# Patient Record
Sex: Female | Born: 1990 | Race: White | Hispanic: No | Marital: Single | Smoking: Never smoker
Health system: Southern US, Community
[De-identification: ages and names within clinical notes are randomized; demographics above are authoritative.]

## PROBLEM LIST (undated history)

## (undated) DIAGNOSIS — L309 Dermatitis, unspecified: Secondary | ICD-10-CM

## (undated) DIAGNOSIS — F909 Attention-deficit hyperactivity disorder, unspecified type: Secondary | ICD-10-CM

## (undated) DIAGNOSIS — F419 Anxiety disorder, unspecified: Secondary | ICD-10-CM

## (undated) DIAGNOSIS — N938 Other specified abnormal uterine and vaginal bleeding: Secondary | ICD-10-CM

## (undated) DIAGNOSIS — F319 Bipolar disorder, unspecified: Secondary | ICD-10-CM

## (undated) DIAGNOSIS — F32A Depression, unspecified: Secondary | ICD-10-CM

## (undated) DIAGNOSIS — J189 Pneumonia, unspecified organism: Secondary | ICD-10-CM

## (undated) DIAGNOSIS — F329 Major depressive disorder, single episode, unspecified: Secondary | ICD-10-CM

## (undated) DIAGNOSIS — R0602 Shortness of breath: Secondary | ICD-10-CM

## (undated) DIAGNOSIS — B977 Papillomavirus as the cause of diseases classified elsewhere: Secondary | ICD-10-CM

## (undated) DIAGNOSIS — N809 Endometriosis, unspecified: Secondary | ICD-10-CM

## (undated) HISTORY — DX: Attention-deficit hyperactivity disorder, unspecified type: F90.9

## (undated) HISTORY — PX: TOE SURGERY: SHX1073

## (undated) HISTORY — DX: Papillomavirus as the cause of diseases classified elsewhere: B97.7

## (undated) HISTORY — DX: Other specified abnormal uterine and vaginal bleeding: N93.8

## (undated) HISTORY — DX: Anxiety disorder, unspecified: F41.9

## (undated) HISTORY — PX: OOPHORECTOMY: SHX86

## (undated) HISTORY — DX: Endometriosis, unspecified: N80.9

## (undated) HISTORY — DX: Major depressive disorder, single episode, unspecified: F32.9

## (undated) HISTORY — DX: Shortness of breath: R06.02

## (undated) HISTORY — DX: Depression, unspecified: F32.A

## (undated) HISTORY — DX: Dermatitis, unspecified: L30.9

## (undated) HISTORY — DX: Bipolar disorder, unspecified: F31.9

---
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
## 2008-02-10 ENCOUNTER — Telehealth (INDEPENDENT_AMBULATORY_CARE_PROVIDER_SITE_OTHER): Payer: Self-pay | Admitting: *Deleted

## 2008-02-10 ENCOUNTER — Ambulatory Visit: Payer: Self-pay | Admitting: Family Medicine

## 2008-02-10 DIAGNOSIS — F329 Major depressive disorder, single episode, unspecified: Secondary | ICD-10-CM | POA: Insufficient documentation

## 2008-02-10 DIAGNOSIS — B078 Other viral warts: Secondary | ICD-10-CM | POA: Insufficient documentation

## 2008-02-13 ENCOUNTER — Ambulatory Visit: Payer: Self-pay | Admitting: Licensed Clinical Social Worker

## 2008-02-20 ENCOUNTER — Ambulatory Visit: Payer: Self-pay | Admitting: Licensed Clinical Social Worker

## 2008-02-20 ENCOUNTER — Telehealth: Payer: Self-pay | Admitting: Family Medicine

## 2008-02-27 ENCOUNTER — Ambulatory Visit: Payer: Self-pay | Admitting: Licensed Clinical Social Worker

## 2008-03-03 ENCOUNTER — Encounter: Payer: Self-pay | Admitting: Family Medicine

## 2008-03-03 ENCOUNTER — Ambulatory Visit: Payer: Self-pay | Admitting: Family Medicine

## 2008-03-03 ENCOUNTER — Other Ambulatory Visit: Admission: RE | Admit: 2008-03-03 | Discharge: 2008-03-03 | Payer: Self-pay | Admitting: Family Medicine

## 2008-03-03 DIAGNOSIS — F988 Other specified behavioral and emotional disorders with onset usually occurring in childhood and adolescence: Secondary | ICD-10-CM | POA: Insufficient documentation

## 2008-03-03 DIAGNOSIS — N946 Dysmenorrhea, unspecified: Secondary | ICD-10-CM | POA: Insufficient documentation

## 2008-03-03 LAB — CONVERTED CEMR LAB: Beta hcg, urine, semiquantitative: NEGATIVE

## 2008-03-06 ENCOUNTER — Ambulatory Visit: Payer: Self-pay | Admitting: Licensed Clinical Social Worker

## 2008-03-11 ENCOUNTER — Telehealth: Payer: Self-pay | Admitting: Family Medicine

## 2008-03-13 ENCOUNTER — Ambulatory Visit: Payer: Self-pay | Admitting: Licensed Clinical Social Worker

## 2008-03-17 ENCOUNTER — Ambulatory Visit: Payer: Self-pay | Admitting: Family Medicine

## 2008-03-17 ENCOUNTER — Encounter: Payer: Self-pay | Admitting: *Deleted

## 2008-03-18 ENCOUNTER — Ambulatory Visit: Payer: Self-pay | Admitting: Licensed Clinical Social Worker

## 2008-03-26 ENCOUNTER — Ambulatory Visit: Payer: Self-pay | Admitting: Licensed Clinical Social Worker

## 2008-03-27 ENCOUNTER — Ambulatory Visit: Payer: Self-pay | Admitting: Licensed Clinical Social Worker

## 2008-03-31 ENCOUNTER — Telehealth: Payer: Self-pay | Admitting: *Deleted

## 2008-05-04 ENCOUNTER — Telehealth: Payer: Self-pay | Admitting: *Deleted

## 2008-05-05 ENCOUNTER — Telehealth: Payer: Self-pay | Admitting: Family Medicine

## 2008-05-07 ENCOUNTER — Telehealth: Payer: Self-pay | Admitting: Family Medicine

## 2008-05-25 ENCOUNTER — Telehealth: Payer: Self-pay | Admitting: Family Medicine

## 2008-05-25 ENCOUNTER — Ambulatory Visit: Payer: Self-pay | Admitting: Family Medicine

## 2008-06-06 ENCOUNTER — Ambulatory Visit: Payer: Self-pay | Admitting: Internal Medicine

## 2008-06-06 ENCOUNTER — Inpatient Hospital Stay (HOSPITAL_COMMUNITY): Admission: EM | Admit: 2008-06-06 | Discharge: 2008-06-08 | Payer: Self-pay | Admitting: Emergency Medicine

## 2008-06-08 ENCOUNTER — Ambulatory Visit: Payer: Self-pay | Admitting: *Deleted

## 2008-06-08 ENCOUNTER — Inpatient Hospital Stay (HOSPITAL_COMMUNITY): Admission: RE | Admit: 2008-06-08 | Discharge: 2008-06-11 | Payer: Self-pay | Admitting: *Deleted

## 2008-08-21 ENCOUNTER — Inpatient Hospital Stay (HOSPITAL_COMMUNITY): Admission: RE | Admit: 2008-08-21 | Discharge: 2008-08-25 | Payer: Self-pay | Admitting: Psychiatry

## 2008-08-21 ENCOUNTER — Ambulatory Visit: Payer: Self-pay | Admitting: Psychiatry

## 2008-11-13 ENCOUNTER — Ambulatory Visit: Payer: Self-pay | Admitting: Family Medicine

## 2008-11-13 DIAGNOSIS — B079 Viral wart, unspecified: Secondary | ICD-10-CM | POA: Insufficient documentation

## 2009-03-14 ENCOUNTER — Inpatient Hospital Stay (HOSPITAL_COMMUNITY): Admission: AD | Admit: 2009-03-14 | Discharge: 2009-03-14 | Payer: Self-pay | Admitting: Obstetrics and Gynecology

## 2009-03-14 ENCOUNTER — Ambulatory Visit: Payer: Self-pay | Admitting: Family

## 2009-07-01 ENCOUNTER — Ambulatory Visit (HOSPITAL_BASED_OUTPATIENT_CLINIC_OR_DEPARTMENT_OTHER): Admission: RE | Admit: 2009-07-01 | Discharge: 2009-07-01 | Payer: Self-pay | Admitting: Orthopedic Surgery

## 2009-07-22 ENCOUNTER — Ambulatory Visit: Payer: Self-pay | Admitting: Family Medicine

## 2009-07-22 DIAGNOSIS — I872 Venous insufficiency (chronic) (peripheral): Secondary | ICD-10-CM | POA: Insufficient documentation

## 2009-07-29 ENCOUNTER — Ambulatory Visit (HOSPITAL_BASED_OUTPATIENT_CLINIC_OR_DEPARTMENT_OTHER): Admission: RE | Admit: 2009-07-29 | Discharge: 2009-07-29 | Payer: Self-pay | Admitting: Orthopedic Surgery

## 2010-06-21 ENCOUNTER — Emergency Department (HOSPITAL_COMMUNITY): Admission: EM | Admit: 2010-06-21 | Discharge: 2010-06-21 | Payer: Self-pay | Admitting: Emergency Medicine

## 2010-10-31 LAB — POCT HEMOGLOBIN-HEMACUE: Hemoglobin: 15 g/dL (ref 12.0–15.0)

## 2010-11-01 LAB — POCT HEMOGLOBIN-HEMACUE: Hemoglobin: 14.1 g/dL (ref 12.0–15.0)

## 2010-11-06 LAB — URINALYSIS, ROUTINE W REFLEX MICROSCOPIC
Bilirubin Urine: NEGATIVE
Glucose, UA: NEGATIVE mg/dL
Specific Gravity, Urine: 1.015 (ref 1.005–1.030)
Urobilinogen, UA: 0.2 mg/dL (ref 0.0–1.0)
pH: 8 (ref 5.0–8.0)

## 2010-11-06 LAB — POCT PREGNANCY, URINE: Preg Test, Ur: NEGATIVE

## 2010-11-06 LAB — GC/CHLAMYDIA PROBE AMP, GENITAL
Chlamydia, DNA Probe: NEGATIVE
GC Probe Amp, Genital: NEGATIVE

## 2010-11-06 LAB — WET PREP, GENITAL: Yeast Wet Prep HPF POC: NONE SEEN

## 2010-11-14 LAB — COMPREHENSIVE METABOLIC PANEL
ALT: 15 U/L (ref 0–35)
Alkaline Phosphatase: 42 U/L (ref 39–117)
CO2: 25 mEq/L (ref 19–32)
Calcium: 9.2 mg/dL (ref 8.4–10.5)
Chloride: 108 mEq/L (ref 96–112)
Creatinine, Ser: 0.74 mg/dL (ref 0.4–1.2)
Glucose, Bld: 106 mg/dL — ABNORMAL HIGH (ref 70–99)
Total Protein: 6.4 g/dL (ref 6.0–8.3)

## 2010-11-14 LAB — DRUGS OF ABUSE SCREEN W/O ALC, ROUTINE URINE
Amphetamine Screen, Ur: NEGATIVE
Benzodiazepines.: NEGATIVE
Cocaine Metabolites: NEGATIVE
Creatinine,U: 369.6 mg/dL
Marijuana Metabolite: NEGATIVE
Methadone: NEGATIVE
Phencyclidine (PCP): NEGATIVE

## 2010-11-14 LAB — CBC
HCT: 39 % (ref 36.0–46.0)
Hemoglobin: 13.6 g/dL (ref 12.0–15.0)
MCHC: 34.8 g/dL (ref 30.0–36.0)
MCV: 88.7 fL (ref 78.0–100.0)
Platelets: 320 10*3/uL (ref 150–400)
RBC: 4.4 MIL/uL (ref 3.87–5.11)
RDW: 12.9 % (ref 11.5–15.5)
WBC: 8.4 10*3/uL (ref 4.0–10.5)

## 2010-11-14 LAB — PREGNANCY, URINE: Preg Test, Ur: NEGATIVE

## 2010-11-14 LAB — URINALYSIS, ROUTINE W REFLEX MICROSCOPIC
Glucose, UA: NEGATIVE mg/dL
Protein, ur: NEGATIVE mg/dL
pH: 6 (ref 5.0–8.0)

## 2010-11-14 LAB — TSH: TSH: 0.908 u[IU]/mL (ref 0.350–4.500)

## 2010-12-13 NOTE — H&P (Signed)
[name], [name] NO.:  [id number]   MEDICAL RECORD NO.:  [id number]          PATIENT TYPE:  IPS   LOCATION:  0502                          FACILITY:  [facility]   PHYSICIAN:  [name], M.D.      DATE OF BIRTH:  [birth year]   DATE OF ADMISSION:  08/21/2008  DATE OF DISCHARGE:                       PSYCHIATRIC ADMISSION ASSESSMENT   This is a voluntary admission to the services of Dr. Geoffery Russo.   IDENTIFYING INFORMATION:  This is a 20 year old single white female.  She was brought to the hospital here yesterday by her probation officer  and her grandparents, as she had made a suicidal gesture.  The patient  tried to cut her wrists.  Her grandparents intervened.  She had  superficially cut her right hand.  Apparently, the patient and her  grandparents were arguing again, just not getting along.  The patient  acknowledged that this particular conflict was over a relationship with  a boyfriend, whom she was seeing the last time she was here at [facility].  Patient is not to have any contact with him, by  court order.  Today, the patient denies seeing him or having any contact  with him.  However, her grandparents, who are her legal guardians,  disagree about this.   PAST PSYCHIATRIC HISTORY:  She was last with [place] from November 9th until  November 12th.   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   FAMILY HISTORY:  Her biological father is an alcoholic.   ALCOHOL/DRUG HISTORY:  Patient does have a history for polysubstance  abuse but not recently.   PRIMARY CARE [name]:  Dr. Tawanna Russo.  After she was last with [place], she saw  Kaylee Lulas, NP, and [name].  She does have upcoming  appointments with both.   MEDICAL PROBLEMS:  None.   MEDICATIONS:  1. BuSpar 10 mg t.i.d.  2. Zoloft 100 mg daily.  3. Trazodone 100.   She states that these meds are quite helpful, although she could use a  little increase in the trazodone, as she finds she awakens at 3 or 4 in  the morning and has difficulty returning to sleep.   She has no known drug allergies.   PHYSICAL EXAMINATION:  She is a well-developed and well-nourished 65-  year-old who appears her stated age.  She does have an ankle bracelet  intact on her left ankle.  VITAL SIGNS:  She is 67 inches tall.  Weighs 141.  Temperature 98.3,  blood pressure 94/63, pulse 68, respirations 18.  She does have superficial self-inflicted cuts on her right thenar area  of her thumb.  The remainder of her physical exam is unremarkable.   Her lab work is not yet ready.   MENTAL STATUS EXAM:  Today, she is alert and oriented.  She is  appropriately groomed, dressed, and nourished.  Her speech is in normal  rate, rhythm, and tone.  Her mood is for the most part appropriate to  the situation.  She gets irritated and frustrated  easily.  Judgment and  insight are fair.  Concentration and memory are intact.  Intelligence is  at least average.  She denies being suicidal or homicidal.  She states  that she did that yesterday because she is frustrated.  She denies  having auditory or visual hallucinations.   DIAGNOSES:   AXIS I:  Mood disorder, not otherwise specified.   AXIS II:  Deferred.   AXIS III:  None at present.   AXIS IV:  Legal and primary support.   AXIS V:  35.   PLAN:  Admit for safety and stabilization to help coordinate a change of  residence.  She is hoping to go live with her biological father's  father, so this would be her paternal grandfather.   ESTIMATED LENGTH OF STAY:  3-4 days.      [name], P.A.-C.      [name], M.D.  Electronically Signed    MD/MEDQ  D:  08/22/2008  T:  08/22/2008  Job:  [id number]

## 2010-12-13 NOTE — Consult Note (Signed)
[name], [name] NO.:  [id number]   MEDICAL RECORD NO.:  [id number]          PATIENT TYPE:  INP   LOCATION:  1509                         FACILITY:  [facility]   PHYSICIAN:  Anselm Jungling, MD  DATE OF BIRTH:  02/07/1991   DATE OF CONSULTATION:  06/07/2008  DATE OF DISCHARGE:                                 CONSULTATION   IDENTIFYING DATA AND REASON FOR REFERRAL:  The patient is a 20 year old  single Caucasian female referred for psychiatric consultation in the  aftermath of a suicide attempt by overdose.  Sources of information  include the patient's chart, the patient herself and her parents, who  were interviewed along with the patient, with the patient's permission.   HISTORY OF THE PRESENTING PROBLEMS:  The patient has a history of  depression that appears to be of about one year's duration.  In January  of 2009 she took an overdose of Ritalin.  She did not get any further  treatment, however, until the end of August of this year, when she  consulted with Dr. Tawanna Russo at [facility].  He prescribe Celexa, the  antidepressant, and also, she was referred to an individual therapist,  [name], whom she has now seen twice.  She has also been  prescribed Adderall, ostensibly for ADHD, and Klonopin.   Two days ago the patient took an overdose of her medication.  Her  parents, with whom she lives, were aware of this.  They did not seek  medical attention.  The mother monitored her closely.  However, the  following day, that is yesterday, the patient took another overdose, a  couple of handfuls of Celexa.  She was admitted to [facility],  and today is medically stable.   The patient and her parents live in [place], [place].  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   She indicates that this was a suicide attempt and she did attend to die.  She indicates that she feels worthless lately, I can't get anything  done and I never feel happy.   The patient's mother explains that she has been involved with a young  man named [name] during the past several months.  He is currently  referred to as her ex-boyfriend, but mother indicates that [name] has  had a great deal of difficulty breaking away from this young man, who  was been described as sociopathic and narcissistic.  She has gotten into  legal trouble because of being associated with him during some of his  illegal activities.  In fact, there is a court order forbidding her to  have any further contact with him and because she has broken that order  she has been on house arrest for the past several weeks.  This young man  is felt to be a very poor influence, and in fact he recently on one  occasion told her in a text message that she should suicide.   MEDICAL HISTORY:  Please refer to H and P.   FAMILY AND SOCIAL HISTORY:  As  above.   MENTAL STATUS AND OBSERVATIONS:  The patient is a well-nourished,  normally-developed, late adolescent female who is interviewed in her  hospital room.  She has been on one-to-one observation.  Her parents are  present.  They appear to be quite supportive.  The patient indicates  that she is comfortable with her parents being present while I interview  her.  Her thoughts and speech are normally organized.  There is nothing  to suggest any underlying psychosis, thought disorder or delirium.  She  appears to have a reasonable degree of insight, and her judgment about  her current situation appears to be intact, her recent poor decision-  making not withstanding.  She indicates that she is glad she survived  the overdose, even though she did intend to die at the time that she  took it.   IMPRESSION:  AXIS I:  Major depressive disorder, recurrent.  AXIS II:  Deferred.  AXIS III:  Status post overdose.  AXIS IV:  Stressors severe.  AXIS V:  GAF 45.   RECOMMENDATIONS:  I discussed with the patient and her  parents the  possibility of coming to inpatient psychiatry for further crisis  stabilization and more definitive treatment of her depression.  The  patient and her parents are both agreeable to this.  We feel that it is  necessary, because the patient has obviously been involved in a  reasonable plan of outpatient treatment for her depression, without  success, and in fact has made two separate overdoses within the past 48  hours.  She appears to be a somewhat immature individual who may be  under the sway of a highly controlling individual, who the parents fear  she may still be in contact with.  As such, her safety cannot be assured  outside of the inpatient setting at this point in time.   I will contact [facility] assessment and initiate the transfer process,  assuming the patient is medically cleared for discharge from [facility] today.      Anselm Jungling, MD  Electronically Signed     SPB/MEDQ  D:  06/07/2008  T:  06/07/2008  Job:  [phone]

## 2010-12-13 NOTE — H&P (Signed)
NAMEMARGRIT, MINNER NO.:  [id number]   MEDICAL RECORD NO.:  [id number]          PATIENT TYPE:  IPS   LOCATION:  0306                          [facility]ACILITY:  [facility]   PHYSICIAN:  Jasmine Pang, M.D. DATE OF BIRTH:  09-13-1990   DATE OF ADMISSION:  06/08/2008  DATE OF DISCHARGE:                       PSYCHIATRIC ADMISSION ASSESSMENT   A 20 year old female voluntarily admitted on June 08, 2008.   HISTORY OF PRESENT ILLNESS:  Patient is a transfer from [facility] after the patient had a short stay after overdosing on Klonopin  and then Celexa.  She reports that she initially overdosed on her  Klonopin on Friday and then on Saturday overdosed on her Celexa.  Her  parents had called 911 at the time.  The patient was transferred to the  emergency department and then admitted.  The patient reports that she  has been having conflict between her ex-boyfriend and her parents.  She  states her parents do not like her ex-boyfriend.  She apparently got  into some legal problems with her ex-boyfriend with a breaking and  entering charge. Patient today denies any suicidal thoughts.  Reports  having supportive parents.  Denies wanting any further connection with  her ex-boyfriend.  She has been sleeping satisfactory and has been  eating satisfactory.  Denies any alcohol or drug use.   PAST PSYCHIATRIC HISTORY:  First admission to [facility].  Has an appointment with Ocie Bob, who she has not seen yet.  Also has a therapist named Roanna Raider.   SOCIAL HISTORY:  This is a 20 year old single female.  She lives in  [place].  She lives with her parents.  The patient is currently under  house arrest for breaking and entering, is a Consulting civil engineer at [employer]   FAMILY HISTORY:  Patient is adopted.   ALCOHOL/DRUG HISTORY:  The patient states that her last use of  recreational drugs was in May 2009.   PRIMARY CARE Kaitlan Bin:  Eugenio Hoes. Tawanna Cooler, MD at  [facility].   MEDICAL PROBLEMS:  Denies any acute or chronic health problems.   MEDICATIONS:  1. Has been prescribed Celexa.  The patient was prescribed 20, but      taking 40 mg at bedtime to help her sleep.  2. Klonopin which she states was helpful for sleep purposes.  3. Adderall for ADHD.   DRUG ALLERGIES:  NO KNOWN ALLERGIES.   PHYSICAL EXAMINATION:  GENERAL:  This is a healthy-appearing young  female.  She was fully assessed at [facility] during her hospital stay  and again she appears in no acute distress.  Offers no complaints,  appears well-developed and well-nourished.  VITAL SIGNS:  Temperature of 97.3, 64 heart rate, 18 respirations, blood  pressure is 96/58, 5 feet 7 inches tall, 142 pounds.   LABORATORY DATA:  Pregnancy test is negative.  CBC within normal limits.  Urine drug screen positive for amphetamines, positive for  benzodiazepines.  Acetaminophen level less than 10.  CMET  within normal  limits.  Alcohol level less than 5.  Salicylate less than 4.  MENTAL STATUS EXAM:  This is a young female, cooperative, casually  dressed.  She is fully alert.  Speech is clear, normal pace and tone.  The patient's mood is neutral.  The patient's affect is depressed and  minimal anxiety level.  Thought process are coherent and goal directed.  Denies any suicidal thoughts.  No delusional statements.  Cognitive  function intact.  Her memory appears to be good.  Judgment and insight  is fair.   AXIS I:  Mood disorder.  AXIS II:  Deferred.  AXIS III:  No known medical conditions.  AXIS IV:  Problems with primary support group and legal system.  AXIS V:  Current is 45.   PLAN:  Have her contract for safety.  We will stabilize mood and  thinking.  We will hold her Adderall and Klonopin at this time and have  Librium available for withdrawal symptoms on a p.r.n. basis.  We  discussed her Celexa.  The patient does not feel this was beneficial.  We will initiate Zoloft, but the  risks and benefits discussed.  We will  also have Seroquel available at bedtime for sleep and have 25 mg  available on a p.r.n. basis for anxiety.  We will also have a family  session with her parents for support, concerns and increase her coping  skills.  Tentative length of stay at this time is 3-4 days.      Landry Corporal, N.P.      Jasmine Pang, M.D.  Electronically Signed    JO/MEDQ  D:  06/09/2008  T:  06/09/2008  Job:  [id number]

## 2010-12-13 NOTE — Discharge Summary (Signed)
NAMEANALIA, [name] NO.:  [id number]   MEDICAL RECORD NO.:  [id number]          PATIENT TYPE:  IPS   LOCATION:  0306                          FACILITY:  [facility]   PHYSICIAN:  Jasmine Pang, M.D. DATE OF BIRTH:  1990/12/11   DATE OF ADMISSION:  06/08/2008  DATE OF DISCHARGE:  06/11/2008                               DISCHARGE SUMMARY   IDENTIFICATION:  This is a 20 year old single white female who was  admitted on a voluntary basis on June 08, 2008.   HISTORY OF PRESENT ILLNESS:  The patient has a transfer from [facility] after she had a short stay for an overdose.  She had overdosed  on Klonopin and then Celexa.  She reports that she was initially  overdosed on Klonopin on Friday and then on Saturday overdosed on her  Celexa.  Her parents called 9-1-1 at the time.  The patient was  transferred to the emergency department and then admitted.  The patient  reports that she has been having conflict between her ex-boyfriend and  her parents.  She states her parents do not like her ex-boyfriend.  She  apparently got into some legal problems with her ex-boyfriend with a  breaking and entering charge.  The patient today denies any suicidal  thoughts.  She reports having supportive parents.  Denies wanting any  further connection with her ex-boyfriend.  The patient has been sleeping  satisfactorily and has been eating satisfactorily.  She denies any  alcohol or drug use.   PAST PSYCHIATRIC HISTORY:  This is the first admission to [facility].  She has an appointment with Ellis Savage for medication  management at [facility].  She has not seen her yet.  She also has a therapist, named Silver Huguenin.   FAMILY HISTORY:  None known.   ALCOHOL AND DRUG HISTORY:  The patient states that her last use of  recreational drugs was in May 2009.   MEDICAL PROBLEMS:  Denies any chronic or acute health problems.   MEDICATIONS:  The  patient has been prescribed Celexa.  She was  prescribed 20 mg and was taking 40 mg at bedtime to help me sleep.  She has been on Klonopin, which she states was very helpful for sleep  purposes.  She has also been on Adderall for ADHD.   DRUG ALLERGIES:  No known drug allergies.   PHYSICAL FINDINGS:  There were no acute physical or medical problems  noted.   LABORATORY DATA:  Pregnancy test was negative.  CBC was within normal  limits.  Urine drug screen positive for amphetamines, positive for  benzodiazepines.  Acetaminophen level less than 10.  C-MET within normal  limits.  Alcohol level less than 5.  Salicylate less than 4.   HOSPITAL COURSE:  Upon admission, the patient was started on Ambien 5 mg  p.o. q.h.s. p.r.n. insomnia may repeat x1 if needed and Librium 25 mg  q.6 h. p.r.n. symptoms of withdrawal x48 hours.  She was also started on  Seroquel 50 mg at h.s. and Zoloft 25  mg daily and Seroquel 25 mg q.6 h.  p.r.n. anxiety.  She was too sedate on the Seroquel and did not want to  use this anymore.  This was discontinued as was the Librium and she was  started back on Klonopin 0.5 mg p.o. q.h.s.  In individual sessions with  me, the patient was friendly and cooperative.  She also participated  appropriately in unit therapeutic groups and activities.  She denies  current use of drugs or alcohol.  She states she stopped in May 2009.  She states she used to abuse Xanax and alcohol.  She discussed her  boyfriend.  He got her into trouble with a breaking and entering.  She  is now on house arrest.  The parents do not like the boyfriend and she  had gotten into an argument with them.  She was adopted by her  grandparents, but still has a close relationship with her biologic  mother.  It is the grandparents; however, that she considers her mother  and father.  On June 10, 2008, the patient was less depressed and  less anxious.  Grandparents are visiting and very supportive.  Sleep  was  good.  Appetite was good.  On June 11, 2008, mental status had  improved markedly from admission status.  The patient had a family  session with her parents today.  This went well and they were very  supportive of her.  Mood was less depressed, less anxious.  Affect  consistent with mood.  There was no suicidal or homicidal ideation.  No  thoughts of self-injurious behavior.  No auditory or visual  hallucinations.  No paranoia or delusions.  Thoughts were logical and  goal-directed.  Thought content no predominant theme.  Cognitive was  grossly intact.  Insight good.  Judgment good.  Impulse control was  good.  She was having no side effects from medications.  She was wanting  discharge today and her family felt comfortable with her coming home  after the family session.   DISCHARGE DIAGNOSES:  Axis I:  Mood disorder, not otherwise specified,  history of polysubstance abuse, none since May 2009.  Axis II:  None.  Axis III:  No known medical problems.  Axis IV:  Severe (problems with primary support group in the legal  system, burden of psychiatric illness).  Axis V: Global assessment of functioning was 55 upon discharge.  GAF was  45 upon admission.  GAF was 65 highest past year.   DISCHARGE PLANS:  There was no specific activity level or dietary  restrictions.   POSTHOSPITAL CARE PLANS:  The patient will go to [facility] to see Ellis Savage on 06/30/2008 at 1:50 p.m.  She will also  see her therapist, Silver Huguenin on November 17th at 4:30 p.m.   DISCHARGE MEDICATIONS:  1. Zoloft was increased to 50 mg daily.  She was also discharged on      clonazepam 0.5 mg p.o. q.h.s.  Ambien 5 mg at bedtime if needed for      sleep.  She was to restart her Adderall as prescribed by her      primary care physician.  She was to stop the Celexa.      Jasmine Pang, M.D.  Electronically Signed     BHS/MEDQ  D:  06/11/2008  T:  06/12/2008  Job:  [id number]

## 2010-12-13 NOTE — Discharge Summary (Signed)
Kaylee, Russo                ACCOUNT NO.:  [id number]   MEDICAL RECORD NO.:  [id number]          PATIENT TYPE:  INP   LOCATION:  1509                         FACILITY:  [facility]   PHYSICIAN:  Raenette Rover. Felicity Coyer, MDDATE OF BIRTH:  [birth year]   DATE OF ADMISSION:  06/06/2008  DATE OF DISCHARGE:  06/08/2008                               DISCHARGE SUMMARY   PRIMARY CARE PHYSICIAN:  Tinnie Gens A. Tawanna Cooler, MD.   DISCHARGE DIAGNOSES:  1. Suicide attempt with drug overdose with Celexa.  Note, this is the      patient's third suicide attempt.  2. Major depressive disorder.   HISTORY OF PRESENT ILLNESS:  Ms. [name] is a 20 year old white female  with past medical history of anxiety, depression and ADHD who presented  to [facility] on day of admission with reports of drug  overdose.  The patient reported that on day prior to admission she did  take Klonopin. along with Celexa however, the patient was unsure of how  many pills she did take.  The patient did admit to suicide attempt by  taking medications.  Of note, the patient's depression appears to be  approximately 1 year in duration.  Per psychiatry in January of 2009 the  patient took an overdose or Ritalin however, did not receive any further  treatment until August of this year at which time primary care physician  prescribed Celexa, as well as outpatient psychiatric treatment.  At time  of admission the patient had seen outpatient therapy twice.  Please see  complete psychiatric consultation for further details.   PAST MEDICAL HISTORY:  1. Depression, anxiety.  2. ADHD.   COURSE OF HOSPITALIZATION:  Suicide attempt with Celexa overdose and  major depressive disorder.  The patient was admitted to the hospital to  monitor for prolonged QT syndrome, as well as any arrhythmias.  Telemetry times 48 hours was negative for any arrhythmias or EKG  abnormalities.  Again, psychiatry was asked to see the patient in  consultation  who has recommended at this time intensive inpatient  psychiatric treatment and more definitive treatment of the patient's  depression as she has failed outpatient treatment.  The patient denies  any suicidal ideation at this time however, there is certainly a concern  for patient's safety given multiple suicide attempts in the past with  poor control of the patient's depression.  At this time the patient is  felt medically stable for transfer to behavioral health center as lab  work within normal limits.   MEDICATIONS AT TIME OF DISCHARGE:  1. None.  2. The patient has been maintained with p.r.n. Ativan during      hospitalization.  3. Her Adderall Celexa and Klonopin were all discontinued at time of      discharge.  4. Further medication regimen to be determined at behavioral health      center.   PERTINENT LAB WORK AT TIME OF DISCHARGE:  Sodium 139, potassium 4.2, BUN  12, creatinine 0.74.  LFTs within normal limits.  Total albumin 3.5.  Hemoglobin 13.5.  EtOH negative.  Salicylate  and acetaminophen negative.  Urine drug screen at time of admission positive for amphetamines and  benzodiazepines.   PHYSICAL EXAM AT TIME OF DISCHARGE:  Blood pressure 96/58, heart rate  51, respirations 18, temperature 98.2, O2 SAT is 98% on room air.  IN GENERAL:  This is a young white female awake and alert in no acute  distress.  HEAD:  Normocephalic, atraumatic.  EYES:  Pupils are equal, round, reactive to light without scleral  icterus or injection.  Extraocular movements are intact.  EAR, NOSE AND THROAT:  Mucous membranes are moist with no lesions.  NECK:  Supple.  No thyromegaly or lymphadenopathy.  CHEST:  Symmetrical movement, nontender to palpation.  CARDIOVASCULAR:  S1 and S2, regular rate and rhythm.  No murmurs, rubs  or gallops.  No lower extremity edema.  RESPIRATORY:  Lung sounds are clear to auscultation bilaterally with no  increased work of breathing.  GI:  Abdomen is soft,  nontender, nondistended with positive bowel  sounds.  NEUROLOGICALLY:  Patient moves all extremities x4 without any motor or  sensory deficits on exam.  PSYCHOLOGICALLY:  The patient is alert and oriented times three with a  slightly flat affect.   DISPOSITION:  Again, the patient felt medically stable for transfer to  behavioral health center at this time for more intensive inpatient  psychiatric treatment.  At time of dictation awaiting bed availability.      Cordelia Pen, NP      Raenette Rover. Felicity Coyer, MD  Electronically Signed    LE/MEDQ  D:  06/08/2008  T:  06/08/2008  Job:  [id number]   cc:   Tinnie Gens A. Tawanna Cooler, MD  [address] [place]  [place] [address]

## 2010-12-13 NOTE — H&P (Signed)
[name], [name]                ACCOUNT NO.:  [id number]   MEDICAL RECORD NO.:  [id number]          PATIENT TYPE:  INP   LOCATION:  1509                         FACILITY:  [facility]   PHYSICIAN:  Kela Millin, M.D.DATE OF BIRTH:  06-19-1991   DATE OF ADMISSION:  06/06/2008  DATE OF DISCHARGE:                              HISTORY & PHYSICAL   PRIMARY CARE PHYSICIAN:  Dr. Kelle Darting with [facility].   CHIEF COMPLAINT:  Drug overdose.   HISTORY OF PRESENT ILLNESS:  The patient is a 20 year old white female  with past medical history significant for anxiety/depression, ADD, who  presents with the above complaints.  She states that on the day prior to  presentation she took some Klonopin along with Celexa and she states she  does not remember how many of those she took, but when she woke up this  morning she was still alive and so she took a handful of Celexa sometime  between 3:00 and 4:00 p.m.  She admits that she was suicidal at the time  she took these medications, but that she is no longer suicidal at this  time.  Her adopted mother, who is actually her grandmother, states that  she has been on house arrest for breaking and entry, and one of the  conditions given to her was that she was not to contact any of the other  codefendants.  And it happens that one of the code defendants is her  boyfriend, and so she has been secretly contacting him, and when she is  confronted her about it today she became very agitated and stated that  she was going to take her pills, and she hid them from her, but she  ended up going through her (Grandmother's) medications and she had  Celexa from about 3 years ago and she took handfuls of the Celexa.  It  is unclear exactly how many tablets she took.  She denies chest pain.   DICTATION ENDED HERE.      Kela Millin, M.D.  Electronically Signed     ACV/MEDQ  D:  06/06/2008  T:  06/07/2008  Job:  [id number]

## 2010-12-13 NOTE — H&P (Signed)
[name], [name]                ACCOUNT NO.:  [id number]   MEDICAL RECORD NO.:  [id number]          PATIENT TYPE:  INP   LOCATION:  1509                         FACILITY:  [facility]   PHYSICIAN:  Kela Millin, M.D.DATE OF BIRTH:  1991/01/13   DATE OF ADMISSION:  06/06/2008  DATE OF DISCHARGE:                              HISTORY & PHYSICAL   CONTNUATION:   PRIMARY CARE PHYSICIAN:  Dr. Kelle Russo.   HISTORY OF PRESENT ILLNESS:  She also denies cough, fevers, dysuria,  melena and hematochezia.  Poison Control was called per the ED and they  recommended giving the patient activated charcoal without sorbitol and  also the recommendation was for the patient to be monitored on telemetry  for QT prolongation.  She had a drug screen done in the ER which was  positive for benzodiazepines and amphetamines (she was on Adderall as  well as Klonopin).  Her acetaminophen level was less than 10, alcohol  level less than 5, and salicylate level less than 4. She is admitted for  further evaluation and management.  Her grandmother also states that  after the patient took the handful of Celexa she also proceeded to take  a knife and was threatening to kill herself, and at that point she  called 9-1-1.   PAST MEDICAL HISTORY:  As above.   MEDICATIONS:  1. Adderall 15 mg q.a.m.  2. Celexa 20 mg p.o. q.h.s.  3. Klonopin 0.5 mg p.r.n.   ALLERGIES:  NKDA.   SOCIAL HISTORY:  Positive for tobacco.  She denies alcohol.   REVIEW OF SYSTEMS:  As per HPI, other review of systems negative.   PHYSICAL EXAM:  GENERAL:  The patient is a young, white female.  She is  alert and oriented x3, in no apparent distress.  VITAL SIGNS: Temperature is 98.4 with a blood pressure of 97/60, pulse  of 84, respiratory rate of 20, O2 sat of 97%.  HEENT: PERRL, EOMI. Sclerae are anicteric.  Mucous membranes appear  black (from the charcoal).  NECK: Supple, no adenopathy, no thyromegaly and no JVD.  LUNGS: Clear to  auscultation bilaterally.  No crackles and no wheezes.  CARDIOVASCULAR: Regular rate and rhythm.  Normal S1-S2.  ABDOMEN:  Soft, bowel sounds present, nontender, nondistended.  No  organomegaly and no masses palpable.  EXTREMITIES:  No cyanosis and no edema.  NEURO: She is alert and oriented x3.  Cranial nerves II-XII grossly  intact.  Nonfocal exam.   LABORATORY DATA:  Her white cell count is 8.8, hemoglobin 13.5,  hematocrit of 40.1, platelet count of 365, neutrophil count of 59%. The  urine drug screen is positive for benzodiazepines and amphetamines,  otherwise negative.  The other drug levels as per HPI.  Sodium is 140,  potassium 3.9, chloride 108, CO2 of 27, glucose of 70, BUN 10,  creatinine 0.92, calcium of 9.1, total protein 6.7, albumin of 4.1, AST  is 16, ALT 12.   ASSESSMENT AND PLAN:  1. Suicide attempt/drug overdose - as discussed above, will admit for      close monitoring, one-on-one suicide precautions.  Also monitor on      tele and follow and repeat EKG to monitor for QT      prolongation/arrhythmias.  I have consulted psychiatry for further      recommendations.  2. History of anxiety/depression.  3. History of attention-deficit disorder- as above.  Psychiatric      consultation for further recommendations.      Kela Millin, M.D.  Electronically Signed     ACV/MEDQ  D:  06/06/2008  T:  06/07/2008  Job:  [id number]   cc:   Kaylee Darting, MD

## 2010-12-16 NOTE — Discharge Summary (Signed)
NAMEMADISSON, [name] NO.:  [id number]   MEDICAL RECORD NO.:  [id number]          PATIENT TYPE:  IPS   LOCATION:  0502                          [facility]  [facility]   PHYSICIAN:  Geoffery Lyons, M.D.      DATE OF BIRTH:  [birth year]   DATE OF ADMISSION:  08/21/2008  DATE OF DISCHARGE:  08/25/2008                               DISCHARGE SUMMARY   CHIEF COMPLAINT AND PRESENT ILLNESS:  This was the second admission to  [facility] for this 20 year old single white female  brought to the hospital by her probation officer and her grandparents.  She had made a suicide gesture.  She tried to cut her wrist.  Apparently, she was arguing with her grandparents again just not  getting along.  This particular conflict was over the relationship with  a boyfriend whom she was seeing the last time she was here at [facility].  She is not supposed to have any contact with him by  court order __________ said that they were going to violate her  probation and __________.   PAST PSYCHIATRIC HISTORY:  Last admission November 9th to November 12th.   SUBSTANCE ABUSE HISTORY:  History of polysubstance abuse, clean, no  relapse.   MEDICAL HISTORY:  Noncontributory.   MEDICATIONS:  1. BuSpar 10 mg 3 times a day.  2. Zoloft 100 mg per day.  3. Trazodone 100 mg at bedtime.   PHYSICAL EXAMINATION:  Compatible with the already described  lacerations.   LABORATORY WORKUP:  White blood cell 8.4, hemoglobin 13.6, sodium 140,  potassium 3.7, glucose 106, SGOT 18, SGPT 15, total bilirubin 0.5, and  TSH 0.908.  Drug screening negative for substances of abuse.   Mental status exam reveals alert and cooperative female.  Appropriately  groomed, dressed, and nourished.  Speech is normal, rate, rhythm, and  tone.  Mood is anxious, worries, gets irritated and frustrated easily.  No active delusion.  No hallucinations.  Thought process is logical,  coherent, and  relevant.  Endorsed no active suicidal or homicidal ideas.  Cognition well preserved.   ADMISSION DIAGNOSES:  AXIS I: Mood disorder, not otherwise specified.  Polysubstance abuse, in remission.  AXIS II: No diagnosis.  AXIS III: No diagnosis.  AXIS IV: Moderate.  AXIS V: Global assessment of functioning upon admission 35, highest  global assessment of functioning in the last year 60.   COURSE IN HOSPITAL:  She was readmitted.  Started in individual and  group psychotherapy.  We went ahead and started Lamictal as she endorsed  mood changes with episodes of racing thoughts, increased energy,  decreased sleep, poor impulse control, and irritable, then falls into  depression.  She is on house arrest since September.  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  This is going well for her.  There was a family session  on January 26th.  The parents spoke that many times she will become  angry and be impulsive.  She recognized that and was willing to take  medication as well as work herself, which  is already going well.  On  January 26th, she was in full contact of reality.  There were no active  suicidal or homicidal ideas.  No hallucinations or delusions.  She was  oriented and motivated to pursue further outpatient treatment.  Pretty  committed not to getting anymore trouble, was going to decide not to  contact the boyfriend anymore, as she herself realized that the  relationship was not a good relationship to begin with and the  relationship was a factor in her abusing substance before.   DISCHARGE DIAGNOSES:  AXIS I: Mood disorder, not otherwise specified.  Anxiety disorder, not otherwise specified.  Polysubstance abuse, in  remission.  AXIS II: No diagnosis.  AXIS III: No diagnosis.  AXIS IV: Moderate.  AXIS V: Global assessment of functioning upon discharge 50 to 55.   Discharged on:  1. BuSpar 10 mg 3 times a day.  2. Zoloft 100 mg per day.  3. Trazodone 150 mg per day.  4. Lamictal 25  mg x12 more days then increase to 50.   Follow up Misty Stanley __________ in [facility].      Geoffery Lyons, M.D.  Electronically Signed     IL/MEDQ  D:  09/04/2008  T:  09/05/2008  Job:  [id number]

## 2011-05-02 LAB — COMPREHENSIVE METABOLIC PANEL
ALT: 12
AST: 16
Albumin: 4.1
Calcium: 9.1
GFR calc non Af Amer: >60
Potassium: 3.9
Total Bilirubin: 0.8
Total Protein: 6.7

## 2011-05-02 LAB — BASIC METABOLIC PANEL
BUN: 12
Calcium: 8.4
GFR calc Af Amer: >60
GFR calc Af Amer: >60
GFR calc non Af Amer: >60
GFR calc non Af Amer: >60
Glucose, Bld: 104 — ABNORMAL HIGH
Potassium: 3.7
Potassium: 4.2
Sodium: 140

## 2011-05-02 LAB — ACETAMINOPHEN LEVEL: Acetaminophen (Tylenol), Serum: <10 — ABNORMAL LOW

## 2011-05-02 LAB — HEPATIC FUNCTION PANEL
ALT: 10
Albumin: 3.5
Indirect Bilirubin: 0.3
Total Protein: 5.6 — ABNORMAL LOW

## 2011-05-02 LAB — RAPID URINE DRUG SCREEN, HOSP PERFORMED
Amphetamines: POSITIVE — AB
Benzodiazepines: POSITIVE — AB
Tetrahydrocannabinol: NOT DETECTED

## 2011-05-02 LAB — CBC
HCT: 40.1
MCHC: 33.6
Platelets: 365
RBC: 4.5
RDW: 13.6

## 2011-05-02 LAB — DIFFERENTIAL
Neutro Abs: 5.2
Neutrophils Relative %: 59

## 2011-05-02 LAB — TSH: TSH: 2.016

## 2011-07-03 ENCOUNTER — Ambulatory Visit (INDEPENDENT_AMBULATORY_CARE_PROVIDER_SITE_OTHER): Payer: BC Managed Care – PPO | Admitting: Family Medicine

## 2011-07-03 ENCOUNTER — Telehealth: Payer: Self-pay | Admitting: Family Medicine

## 2011-07-03 ENCOUNTER — Encounter: Payer: Self-pay | Admitting: Family Medicine

## 2011-07-03 VITALS — BP 110/74 | Temp 98.3°F | Wt 196.0 lb

## 2011-07-03 DIAGNOSIS — Z23 Encounter for immunization: Secondary | ICD-10-CM

## 2011-07-03 DIAGNOSIS — J069 Acute upper respiratory infection, unspecified: Secondary | ICD-10-CM

## 2011-07-03 MED ORDER — HYDROCODONE-HOMATROPINE 5-1.5 MG/5ML PO SYRP: ORAL_SOLUTION | ORAL | Status: DC

## 2011-07-03 NOTE — Patient Instructions (Signed)
Drink lots of water.  Hydromet one half to 1 teaspoon nightly p.r.n. For cough.  Motrin 600 mg twice daily with food.  Return p.r.n.

## 2011-07-03 NOTE — Telephone Encounter (Signed)
Okay to work in

## 2011-07-03 NOTE — Telephone Encounter (Signed)
Pt has cough,congestion, body aches, sinus pressure and headache. Pt req work in [employer] or a med called in to [facility] on [address].

## 2011-07-03 NOTE — Progress Notes (Signed)
  Subjective:    Patient ID: [name], female    DOB: [birth year], 20 y.o.   MRN: [id number]  HPI  [name] Is a 20 year old single female, nonsmoker, who comes in today for evaluation of head congestion, sore throat, and cough for one week.  Review of systems negative.  Current medication Wellbutrin 300 mg daily via her psychiatrist for history of depression.  She is currently working .......... 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  HEENT negative.  Neck was supple.  No adenopathy.  Lungs were clear       Assessment & Plan:  Viral URI.  Plan treat symptomatically with Tylenol lots of liquids.  Hydromet p.r.n. Nightly for cough

## 2012-02-19 ENCOUNTER — Encounter: Payer: Self-pay | Admitting: Internal Medicine

## 2012-02-19 ENCOUNTER — Encounter (HOSPITAL_BASED_OUTPATIENT_CLINIC_OR_DEPARTMENT_OTHER): Payer: Self-pay | Admitting: *Deleted

## 2012-02-19 ENCOUNTER — Emergency Department (HOSPITAL_BASED_OUTPATIENT_CLINIC_OR_DEPARTMENT_OTHER): Payer: BC Managed Care – PPO

## 2012-02-19 ENCOUNTER — Emergency Department (HOSPITAL_BASED_OUTPATIENT_CLINIC_OR_DEPARTMENT_OTHER)
Admission: EM | Admit: 2012-02-19 | Discharge: 2012-02-20 | Disposition: A | Discharge status: Home / Self Care | Payer: BC Managed Care – PPO | Attending: Emergency Medicine | Admitting: Emergency Medicine

## 2012-02-19 ENCOUNTER — Ambulatory Visit (INDEPENDENT_AMBULATORY_CARE_PROVIDER_SITE_OTHER): Payer: BC Managed Care – PPO | Admitting: Internal Medicine

## 2012-02-19 VITALS — BP 100/70 | Temp 98.1°F | Wt 182.0 lb

## 2012-02-19 DIAGNOSIS — F3289 Other specified depressive episodes: Secondary | ICD-10-CM | POA: Insufficient documentation

## 2012-02-19 DIAGNOSIS — F909 Attention-deficit hyperactivity disorder, unspecified type: Secondary | ICD-10-CM | POA: Insufficient documentation

## 2012-02-19 DIAGNOSIS — G44209 Tension-type headache, unspecified, not intractable: Secondary | ICD-10-CM

## 2012-02-19 DIAGNOSIS — F329 Major depressive disorder, single episode, unspecified: Secondary | ICD-10-CM | POA: Insufficient documentation

## 2012-02-19 DIAGNOSIS — S0003XA Contusion of scalp, initial encounter: Secondary | ICD-10-CM | POA: Insufficient documentation

## 2012-02-19 DIAGNOSIS — R51 Headache: Secondary | ICD-10-CM

## 2012-02-19 DIAGNOSIS — Y9289 Other specified places as the place of occurrence of the external cause: Secondary | ICD-10-CM | POA: Insufficient documentation

## 2012-02-19 DIAGNOSIS — IMO0002 Reserved for concepts with insufficient information to code with codable children: Secondary | ICD-10-CM | POA: Insufficient documentation

## 2012-02-19 MED ORDER — PROMETHAZINE HCL 12.5 MG PO TABS: 12.5000 mg | ORAL_TABLET | Freq: Four times a day (QID) | ORAL | Status: DC | PRN

## 2012-02-19 MED ORDER — TRAMADOL HCL 50 MG PO TABS: 50.0000 mg | ORAL_TABLET | Freq: Three times a day (TID) | ORAL | Status: DC | PRN

## 2012-02-19 MED ORDER — KETOROLAC TROMETHAMINE 30 MG/ML IJ SOLN
30.0000 mg | Freq: Once | INTRAMUSCULAR | Status: AC
  Administered 2012-02-19: 30 mg via INTRAVENOUS
  Filled 2012-02-19: qty 1

## 2012-02-19 MED ORDER — DIPHENHYDRAMINE HCL 50 MG/ML IJ SOLN
25.0000 mg | Freq: Once | INTRAMUSCULAR | Status: AC
  Administered 2012-02-19: 25 mg via INTRAVENOUS
  Filled 2012-02-19: qty 1

## 2012-02-19 MED ORDER — SODIUM CHLORIDE 0.9 % IV BOLUS (SEPSIS)
1000.0000 mL | Freq: Once | INTRAVENOUS | Status: AC
  Administered 2012-02-19: 1000 mL via INTRAVENOUS

## 2012-02-19 MED ORDER — METOCLOPRAMIDE HCL 5 MG/ML IJ SOLN
10.0000 mg | Freq: Once | INTRAMUSCULAR | Status: AC
  Administered 2012-02-19: 10 mg via INTRAVENOUS
  Filled 2012-02-19: qty 2

## 2012-02-19 NOTE — Patient Instructions (Signed)
Call or return to clinic prn if these symptoms worsen or fail to improve as anticipated.  Head Injury, Adult A head injury happens when the head is hit really hard. A head injury may cause sleepiness, headache, throwing up (vomiting), and problems seeing. If the head injury is really bad, you may need to stay in the hospital. HOME CARE  Have someone with you for the first 24 hours. This person should wake you up every 1 hour to check on your condition.   Only drink water or clear fluid for the rest of the day. Then, go back to your regular diet.   Only take medicines as told by your doctor. Do not take aspirin.   Do not drink alcohol for 2 days.   Do not take medicines that help your relax (sedatives) for 2 days.  Side effects may happen for up to 7 to 10 days. Watch for new problems. GET HELP RIGHT AWAY IF:    You are confused or sleepy.   You cannot be woken up.   You feel sick to your stomach (nauseous) or keep throwing up.   Your dizziness or unsteadiness is get worse, or your cannot walk.   You start to shake (convulse) or pass out (faint).   You have very bad, lasting headaches that are not helped by medicine.   You cannot use your arms or legs like normal.   You have clear or bloody fluid coming from your nose or ears.  MAKE SURE YOU:    Understand these instructions.   Will watch your condition.   Will get help right away if you are not doing well or get worse.  Document Released: 06/29/2008 Document Revised: 07/06/2011 Document Reviewed: 06/02/2009 [facility] Patient Information 2012 [place], [place].
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
## 2012-02-20 ENCOUNTER — Telehealth: Payer: Self-pay | Admitting: Family Medicine

## 2012-02-20 MED ORDER — METOCLOPRAMIDE HCL 10 MG PO TABS: 10.0000 mg | ORAL_TABLET | Freq: Four times a day (QID) | ORAL | Status: DC | PRN

## 2012-02-20 NOTE — Telephone Encounter (Signed)
[facility] Triage Call Report Triage Record Num: [id number] Operator: Albertine Grates Patient Name: [name] Call Date & Time: 02/19/2012 9:25:31PM Patient Phone: [phone] PCP: Gordy Savers Patient Gender: Female PCP Fax : [phone] Patient DOB: 07-31-1991 [facility] Name: [facility] Reason for Call: Caller: [name]/Patient; PCP: Eleonore Chiquito; CB#: [phone]; Call regarding Headache; Was seen in office 7-22 and diagnosed with mild concussion. Was given Tramadol 50mg  at 2000 and has not helped. Pain has worsened than when seen. Advised ED. Protocol(s) Used: Head Injury Recommended Outcome per Protocol: See ED Immediately Reason for Outcome: Severe or worsening headache since injury Care Advice: ~ 07/22/

## 2012-05-23 ENCOUNTER — Emergency Department (HOSPITAL_COMMUNITY): Payer: BC Managed Care – PPO

## 2012-05-23 ENCOUNTER — Encounter (HOSPITAL_COMMUNITY): Payer: Self-pay | Admitting: Emergency Medicine

## 2012-05-23 ENCOUNTER — Emergency Department (HOSPITAL_COMMUNITY)
Admission: EM | Admit: 2012-05-23 | Discharge: 2012-05-23 | Disposition: A | Discharge status: Home / Self Care | Payer: BC Managed Care – PPO | Attending: Emergency Medicine | Admitting: Emergency Medicine

## 2012-05-23 DIAGNOSIS — F3289 Other specified depressive episodes: Secondary | ICD-10-CM | POA: Insufficient documentation

## 2012-05-23 DIAGNOSIS — F909 Attention-deficit hyperactivity disorder, unspecified type: Secondary | ICD-10-CM | POA: Insufficient documentation

## 2012-05-23 DIAGNOSIS — S63501A Unspecified sprain of right wrist, initial encounter: Secondary | ICD-10-CM

## 2012-05-23 DIAGNOSIS — F329 Major depressive disorder, single episode, unspecified: Secondary | ICD-10-CM | POA: Insufficient documentation

## 2012-05-23 DIAGNOSIS — Z8619 Personal history of other infectious and parasitic diseases: Secondary | ICD-10-CM | POA: Insufficient documentation

## 2012-05-23 DIAGNOSIS — Y929 Unspecified place or not applicable: Secondary | ICD-10-CM | POA: Insufficient documentation

## 2012-05-23 DIAGNOSIS — Y9389 Activity, other specified: Secondary | ICD-10-CM | POA: Insufficient documentation

## 2012-05-23 DIAGNOSIS — X500XXA Overexertion from strenuous movement or load, initial encounter: Secondary | ICD-10-CM | POA: Insufficient documentation

## 2012-05-23 DIAGNOSIS — Z872 Personal history of diseases of the skin and subcutaneous tissue: Secondary | ICD-10-CM | POA: Insufficient documentation

## 2012-05-23 DIAGNOSIS — Z8742 Personal history of other diseases of the female genital tract: Secondary | ICD-10-CM | POA: Insufficient documentation

## 2012-05-23 DIAGNOSIS — S63509A Unspecified sprain of unspecified wrist, initial encounter: Secondary | ICD-10-CM | POA: Insufficient documentation

## 2012-05-23 DIAGNOSIS — Z79899 Other long term (current) drug therapy: Secondary | ICD-10-CM | POA: Insufficient documentation

## 2012-05-23 MED ORDER — HYDROCODONE-ACETAMINOPHEN 5-325 MG PO TABS: 2.0000 | ORAL_TABLET | ORAL | Status: DC | PRN

## 2012-05-23 NOTE — ED Provider Notes (Signed)
Medical screening examination/treatment/procedure(s) were performed by non-physician practitioner and as supervising physician I was immediately available for consultation/collaboration.   Rolan Bucco, MD 05/23/12 [phone]

## 2012-05-23 NOTE — ED Notes (Signed)
Pt states she twisted her wrist this am, now having pain and difficulty moving wrist.

## 2012-05-23 NOTE — ED Provider Notes (Signed)
History     CSN: [id number]  Arrival date & time 05/23/12  1117   First MD Initiated Contact with Patient 05/23/12 1137      Chief Complaint  Patient presents with  . Wrist Pain    (Consider location/radiation/quality/duration/timing/severity/associated sxs/prior treatment) HPI Comments: Patient is an otherwise healthy 21 year old female who presents with sudden onset of right wrist pain that started after she twisted her wrist suddenly. Patient reports sudden onset of sharp, severe pain that is localized to right wrist. Patient reports progressive worsening of pain. Wrist movement and make the pain worse. Nothing makes the pain better. Patient reports associated swelling. Patient has not tried anything for pain relief. Patient denies obvious deformity, numbness/tingling, coolness/weakness of extremity, bruising, and any other injury.     Patient is a 21 y.o. female presenting with wrist pain.  Wrist Pain Associated symptoms include arthralgias and joint swelling.    Past Medical History  Diagnosis Date  . DUB (dysfunctional uterine bleeding)   . ADD (attention deficit disorder with hyperactivity)   . Eczema   . Depression   . HPV (human papilloma virus) infection     History reviewed. No pertinent past surgical history.  Family History  Problem Relation Age of Onset  . Cancer Other     breast  . ADD / ADHD Other     History  Substance Use Topics  . Smoking status: Never Smoker   . Smokeless tobacco: Never Used  . Alcohol Use: No    OB History    Grav Para Term Preterm Abortions TAB SAB Ect Mult Living                  Review of Systems  Musculoskeletal: Positive for joint swelling and arthralgias.  All other systems reviewed and are negative.    Allergies  Review of patient's allergies indicates no known allergies.  Home Medications   Current Outpatient Rx  Name Route Sig Dispense Refill  . BUPROPION HCL ER (XL) 300 MG PO TB24 Oral Take 300 mg by  mouth daily.      Marland Kitchen HYDROCODONE-ACETAMINOPHEN 5-325 MG PO TABS Oral Take 2 tablets by mouth every 4 (four) hours as needed for pain. 6 tablet 0  . METOCLOPRAMIDE HCL 10 MG PO TABS Oral Take 1 tablet (10 mg total) by mouth every 6 (six) hours as needed (nausea or headache). 30 tablet 0  . PROMETHAZINE HCL 12.5 MG PO TABS Oral Take 12.5 mg by mouth every 6 (six) hours as needed. For nausea      BP 111/80  Pulse 90  Temp 97.9 F (36.6 C) (Oral)  Resp 18  SpO2 100%  LMP 04/30/2012  Physical Exam  Nursing note and vitals reviewed. Constitutional: She is oriented to person, place, and time. She appears well-developed and well-nourished. No distress.  HENT:  Head: Normocephalic and atraumatic.  Eyes: Conjunctivae normal and EOM are normal.  Neck: Normal range of motion. Neck supple.  Cardiovascular: Normal rate and regular rhythm.  Exam reveals no gallop and no friction rub.   No murmur heard. Pulmonary/Chest: Effort normal and breath sounds normal. She has no wheezes. She has no rales. She exhibits no tenderness.  Abdominal: Soft. She exhibits no distension.  Musculoskeletal: Normal range of motion.       Right wrist tender to palpation and immobile due to extreme pain with movement. No obvious deformity. Sufficient capillary refill of upper extremities.   Neurological: She is alert and oriented to  person, place, and time. Coordination normal.       Strength slightly diminished in right hand due to pain. Sensation equal and intact bilaterally. Speech is goal-oriented. Moves limbs without ataxia.   Skin: Skin is warm and dry. She is not diaphoretic.  Psychiatric: She has a normal mood and affect. Her behavior is normal.    ED Course  Procedures (including critical care time)  Labs Reviewed - No data to display No results found.   1. Right wrist sprain       MDM  1:02 PM Patient's xray negative for fracture. Patient likely has a right wrist sprain. Patient will have a wrist  splint and pain medication. Patient instructed to wear wrist splint during the day for support. She should move wrist as tolerated, rest, ice and elevate for faster healing. No signs of neurovascular compromise. No further evaluation needed at this time.       Emilia Beck, PA-C 05/23/12 1308

## 2012-07-30 ENCOUNTER — Encounter: Payer: Self-pay | Admitting: Family Medicine

## 2012-07-30 ENCOUNTER — Ambulatory Visit (INDEPENDENT_AMBULATORY_CARE_PROVIDER_SITE_OTHER): Payer: BC Managed Care – PPO | Admitting: Family Medicine

## 2012-07-30 VITALS — BP 100/68 | HR 70 | Temp 98.5°F | Wt 187.0 lb

## 2012-07-30 DIAGNOSIS — R110 Nausea: Secondary | ICD-10-CM

## 2012-07-30 DIAGNOSIS — J069 Acute upper respiratory infection, unspecified: Secondary | ICD-10-CM

## 2012-07-30 MED ORDER — HYDROCODONE-HOMATROPINE 5-1.5 MG/5ML PO SYRP: 5.0000 mL | ORAL_SOLUTION | Freq: Four times a day (QID) | ORAL | Status: AC | PRN

## 2012-07-30 MED ORDER — ONDANSETRON 8 MG PO TBDP: 8.0000 mg | ORAL_TABLET | Freq: Three times a day (TID) | ORAL | Status: DC | PRN

## 2012-07-30 NOTE — Progress Notes (Signed)
  Subjective:    Patient ID: [name], female    DOB: June 28, 1991, 21 y.o.   MRN: [id number]  HPI  Patient seen with 2-3 day history of symptoms including sore throat, mostly dry cough and mild body aches. No confirmed fever. No history of flu shot earlier this year. She's had some nausea which she developed earlier today. No vomiting. No diarrhea. Denies any skin rash. No dysuria. No abdominal pain. Generally very healthy. No reported allergies   Review of Systems  Constitutional: Negative for fever and chills.  HENT: Positive for congestion and sore throat. Negative for neck stiffness.   Respiratory: Positive for cough. Negative for shortness of breath and wheezing.   Cardiovascular: Negative for chest pain.  Gastrointestinal: Positive for nausea. Negative for vomiting and diarrhea.  Neurological: Negative for headaches.       Objective:   Physical Exam  Constitutional: She appears well-developed and well-nourished.  HENT:  Right Ear: External ear normal.  Left Ear: External ear normal.  Mouth/Throat: Oropharynx is clear and moist.  Neck: Neck supple.  Cardiovascular: Normal rate and regular rhythm.   Pulmonary/Chest: Effort normal and breath sounds normal. No respiratory distress. She has no wheezes. She has no rales.  Lymphadenopathy:    She has no cervical adenopathy.  Skin: No rash noted.          Assessment & Plan:  Viral syndrome. Doubt influenza. Hycodan as needed for nighttime cough. Zofran 8 mg every 8 hours as needed for nausea and vomiting. Increase fluids. Followup as needed

## 2012-12-27 ENCOUNTER — Ambulatory Visit (INDEPENDENT_AMBULATORY_CARE_PROVIDER_SITE_OTHER): Payer: BC Managed Care – PPO | Admitting: Family Medicine

## 2012-12-27 ENCOUNTER — Encounter: Payer: Self-pay | Admitting: Family Medicine

## 2012-12-27 VITALS — BP 90/68 | Temp 98.3°F | Wt 173.0 lb

## 2012-12-27 DIAGNOSIS — H109 Unspecified conjunctivitis: Secondary | ICD-10-CM

## 2012-12-27 DIAGNOSIS — J309 Allergic rhinitis, unspecified: Secondary | ICD-10-CM

## 2012-12-27 MED ORDER — FLUTICASONE PROPIONATE 50 MCG/ACT NA SUSP: 2.0000 | Freq: Every day | NASAL | Status: DC

## 2012-12-27 MED ORDER — POLYMYXIN B-TRIMETHOPRIM 10000-0.1 UNIT/ML-% OP SOLN: 1.0000 [drp] | OPHTHALMIC | Status: DC

## 2012-12-27 NOTE — Patient Instructions (Addendum)

## 2012-12-27 NOTE — Progress Notes (Signed)
Chief Complaint  Patient presents with  . Conjunctivitis    painful and itchy     HPI:  Acute visit for ? Pink eye: -started a week or 2 ago -symptoms: eyes itchy and irritated with clear drainage, has had nasal congestion and drainage in throat, ear fullness as well -no known exposure to pink eye -denies: fevers, vision changes, SOB -has seasonal allergies -has tried claritin and otc allergy eye drops ROS: See pertinent positives and negatives per HPI.  Past Medical History  Diagnosis Date  . DUB (dysfunctional uterine bleeding)   . ADD (attention deficit disorder with hyperactivity)   . Eczema   . Depression   . HPV (human papilloma virus) infection     Family History  Problem Relation Age of Onset  . Cancer Other     breast  . ADD / ADHD Other     History   Social History  . Marital Status: Single    Spouse Name: N/A    Number of Children: N/A  . Years of Education: N/A   Social History Main Topics  . Smoking status: Never Smoker   . Smokeless tobacco: Never Used  . Alcohol Use: No  . Drug Use: No  . Sexually Active: None   Other Topics Concern  . None   Social History Narrative  . None    Current outpatient prescriptions:HYDROcodone-acetaminophen (NORCO/VICODIN) 5-325 MG per tablet, Take 2 tablets by mouth every 4 (four) hours as needed for pain., Disp: 6 tablet, Rfl: 0;  Oxcarbazepine (TRILEPTAL) 300 MG tablet, Take 300 mg by mouth at bedtime., Disp: , Rfl: ;  traZODone (DESYREL) 150 MG tablet, Take 150 mg by mouth at bedtime., Disp: , Rfl:  fluticasone (FLONASE) 50 MCG/ACT nasal spray, Place 2 sprays into the nose daily., Disp: 16 g, Rfl: 1;  metoCLOPramide (REGLAN) 10 MG tablet, Take 1 tablet (10 mg total) by mouth every 6 (six) hours as needed (nausea or headache)., Disp: 30 tablet, Rfl: 0;  ondansetron (ZOFRAN ODT) 8 MG disintegrating tablet, Take 1 tablet (8 mg total) by mouth every 8 (eight) hours as needed for nausea., Disp: 15 tablet, Rfl:  0 promethazine (PHENERGAN) 12.5 MG tablet, Take 12.5 mg by mouth every 6 (six) hours as needed. For nausea, Disp: , Rfl: ;  trimethoprim-polymyxin b (POLYTRIM) ophthalmic solution, Place 1 drop into both eyes every 4 (four) hours. For 7-10 days, Disp: 10 mL, Rfl: 0  EXAM:  Filed Vitals:   12/27/12 0925  BP: 90/68  Temp: 98.3 F (36.8 C)    Body mass index is 27.94 kg/(m^2).  GENERAL: vitals reviewed and listed above, alert, oriented, appears well hydrated and in no acute distress  HEENT: atraumatic, conjunctiva erythematous, clear drainage from eye, PERRLA, visual acuity and EOM intact bilat, no obvious abnormalities on inspection of external nose and ears, normal appearance of ear canals and TMs, clear nasal congestion with boggy turbinates, mild post oropharyngeal erythema with PND, no tonsillar edema or exudate, no sinus TTP  NECK: no obvious masses on inspection  LUNGS: clear to auscultation bilaterally, no wheezes, rales or rhonchi, good air movement  CV: HRRR, no peripheral edema  MS: moves all extremities without noticeable abnormality  PSYCH: pleasant and cooperative, no obvious depression or anxiety  ASSESSMENT AND PLAN:  Discussed the following assessment and plan:  Conjunctivitis - Plan: trimethoprim-polymyxin b (POLYTRIM) ophthalmic solution  Allergic rhinitis - Plan: fluticasone (FLONASE) 50 MCG/ACT nasal spray  -risks of medications, return precautions discussed -Patient advised to return  or notify a doctor immediately if symptoms worsen or persist or new concerns arise.  There are no Patient Instructions on file for this visit.   [name] R.

## 2013-02-20 ENCOUNTER — Encounter (HOSPITAL_BASED_OUTPATIENT_CLINIC_OR_DEPARTMENT_OTHER): Payer: Self-pay | Admitting: Emergency Medicine

## 2013-02-20 ENCOUNTER — Emergency Department (HOSPITAL_BASED_OUTPATIENT_CLINIC_OR_DEPARTMENT_OTHER)
Admission: EM | Admit: 2013-02-20 | Discharge: 2013-02-20 | Disposition: A | Discharge status: Home / Self Care | Payer: BC Managed Care – PPO | Attending: Emergency Medicine | Admitting: Emergency Medicine

## 2013-02-20 DIAGNOSIS — W57XXXA Bitten or stung by nonvenomous insect and other nonvenomous arthropods, initial encounter: Secondary | ICD-10-CM | POA: Insufficient documentation

## 2013-02-20 DIAGNOSIS — Z872 Personal history of diseases of the skin and subcutaneous tissue: Secondary | ICD-10-CM | POA: Insufficient documentation

## 2013-02-20 DIAGNOSIS — Z8659 Personal history of other mental and behavioral disorders: Secondary | ICD-10-CM | POA: Insufficient documentation

## 2013-02-20 DIAGNOSIS — L299 Pruritus, unspecified: Secondary | ICD-10-CM | POA: Insufficient documentation

## 2013-02-20 DIAGNOSIS — F329 Major depressive disorder, single episode, unspecified: Secondary | ICD-10-CM | POA: Insufficient documentation

## 2013-02-20 DIAGNOSIS — Z79899 Other long term (current) drug therapy: Secondary | ICD-10-CM | POA: Insufficient documentation

## 2013-02-20 DIAGNOSIS — Z8742 Personal history of other diseases of the female genital tract: Secondary | ICD-10-CM | POA: Insufficient documentation

## 2013-02-20 DIAGNOSIS — F3289 Other specified depressive episodes: Secondary | ICD-10-CM | POA: Insufficient documentation

## 2013-02-20 DIAGNOSIS — S60569A Insect bite (nonvenomous) of unspecified hand, initial encounter: Secondary | ICD-10-CM | POA: Insufficient documentation

## 2013-02-20 DIAGNOSIS — Y939 Activity, unspecified: Secondary | ICD-10-CM | POA: Insufficient documentation

## 2013-02-20 DIAGNOSIS — S90569A Insect bite (nonvenomous), unspecified ankle, initial encounter: Secondary | ICD-10-CM | POA: Insufficient documentation

## 2013-02-20 DIAGNOSIS — Z8619 Personal history of other infectious and parasitic diseases: Secondary | ICD-10-CM | POA: Insufficient documentation

## 2013-02-20 DIAGNOSIS — Y9289 Other specified places as the place of occurrence of the external cause: Secondary | ICD-10-CM | POA: Insufficient documentation

## 2013-02-20 NOTE — ED Provider Notes (Signed)
CSN: [id number]     Arrival date & time 02/20/13  2251 History     First MD Initiated Contact with Patient 02/20/13 2304     Chief Complaint  Patient presents with  . Rash   (Consider location/radiation/quality/duration/timing/severity/associated sxs/prior Treatment) Patient is a 22 y.o. female presenting with rash. The history is provided by the patient and a friend.  Rash Pain location: BUE and left lateral thigh. Pain quality: aching   Pain radiates to:  Does not radiate Pain severity:  Mild Onset quality:  Gradual Duration:  1 day Timing:  Constant Progression:  Unchanged Chronicity:  New Context: not recent travel and not trauma   Relieved by:  Nothing Worsened by:  Nothing tried Ineffective treatments:  None tried Associated symptoms: no fever   Associated symptoms comment:  Itching Risk factors: not pregnant   Has a dog and both she and her boyfriend have same lesions  Past Medical History  Diagnosis Date  . DUB (dysfunctional uterine bleeding)   . ADD (attention deficit disorder with hyperactivity)   . Eczema   . Depression   . HPV (human papilloma virus) infection    Past Surgical History  Procedure Laterality Date  . Toe surgery     Family History  Problem Relation Age of Onset  . Cancer Other     breast  . ADD / ADHD Other    History  Substance Use Topics  . Smoking status: Never Smoker   . Smokeless tobacco: Never Used  . Alcohol Use: No   OB History   Grav Para Term Preterm Abortions TAB SAB Ect Mult Living                 Review of Systems  Constitutional: Negative for fever.  HENT: Negative for neck pain.   Eyes: Negative for photophobia.  Skin: Positive for rash.  All other systems reviewed and are negative.    Allergies  Review of patient's allergies indicates no known allergies.  Home Medications   Current Outpatient Rx  Name  Route  Sig  Dispense  Refill  . fluticasone (FLONASE) 50 MCG/ACT nasal spray   Nasal   Place 2  sprays into the nose daily.   16 g   1   . HYDROcodone-acetaminophen (NORCO/VICODIN) 5-325 MG per tablet   Oral   Take 2 tablets by mouth every 4 (four) hours as needed for pain.   6 tablet   0   . EXPIRED: metoCLOPramide (REGLAN) 10 MG tablet   Oral   Take 1 tablet (10 mg total) by mouth every 6 (six) hours as needed (nausea or headache).   30 tablet   0   . ondansetron (ZOFRAN ODT) 8 MG disintegrating tablet   Oral   Take 1 tablet (8 mg total) by mouth every 8 (eight) hours as needed for nausea.   15 tablet   0   . Oxcarbazepine (TRILEPTAL) 300 MG tablet   Oral   Take 300 mg by mouth at bedtime.         Marland Kitchen EXPIRED: promethazine (PHENERGAN) 12.5 MG tablet   Oral   Take 12.5 mg by mouth every 6 (six) hours as needed. For nausea         . traZODone (DESYREL) 150 MG tablet   Oral   Take 150 mg by mouth at bedtime.         Marland Kitchen trimethoprim-polymyxin b (POLYTRIM) ophthalmic solution   Both Eyes   Place 1 drop into  both eyes every 4 (four) hours. For 7-10 days   10 mL   0    BP 127/64  Pulse 84  Temp(Src) 98.2 F (36.8 C) (Oral)  Resp 16  Ht 5' 6.5" (1.689 m)  Wt 178 lb (80.74 kg)  BMI 28.3 kg/m2  SpO2 99%  LMP 02/06/2013 Physical Exam  Constitutional: She is oriented to person, place, and time. She appears well-developed and well-nourished. No distress.  HENT:  Head: Normocephalic and atraumatic.  Mouth/Throat: Oropharynx is clear and moist.  Eyes: Conjunctivae are normal. Pupils are equal, round, and reactive to light.  Neck: Normal range of motion. Neck supple.  Cardiovascular: Normal rate and regular rhythm.   Pulmonary/Chest: Effort normal and breath sounds normal. She has no wheezes.  Abdominal: Soft. Bowel sounds are normal. There is no tenderness.  Musculoskeletal: Normal range of motion.  Neurological: She is alert and oriented to person, place, and time.  Skin: Skin is warm and dry. Rash noted.  Several papules with minimal surrounding edema  consistent with bites  Psychiatric: She has a normal mood and affect.    ED Course   Procedures (including critical care time)  Labs Reviewed - No data to display No results found. No diagnosis found.  MDM  Lesions consistent with bites, suspect flea bites.  Benadryl.  No scratching.    Jasmine Awe, MD 02/20/13 [phone]

## 2013-02-20 NOTE — ED Notes (Signed)
Scattered red bumps on right arm and leg since yesterday.  Pain but denies itching.

## 2013-02-21 ENCOUNTER — Encounter (HOSPITAL_COMMUNITY): Payer: Self-pay | Admitting: *Deleted

## 2013-02-21 DIAGNOSIS — Z872 Personal history of diseases of the skin and subcutaneous tissue: Secondary | ICD-10-CM | POA: Insufficient documentation

## 2013-02-21 DIAGNOSIS — F3289 Other specified depressive episodes: Secondary | ICD-10-CM | POA: Insufficient documentation

## 2013-02-21 DIAGNOSIS — Z8742 Personal history of other diseases of the female genital tract: Secondary | ICD-10-CM | POA: Insufficient documentation

## 2013-02-21 DIAGNOSIS — Z8619 Personal history of other infectious and parasitic diseases: Secondary | ICD-10-CM | POA: Insufficient documentation

## 2013-02-21 DIAGNOSIS — IMO0002 Reserved for concepts with insufficient information to code with codable children: Secondary | ICD-10-CM | POA: Insufficient documentation

## 2013-02-21 DIAGNOSIS — Z79899 Other long term (current) drug therapy: Secondary | ICD-10-CM | POA: Insufficient documentation

## 2013-02-21 DIAGNOSIS — L089 Local infection of the skin and subcutaneous tissue, unspecified: Secondary | ICD-10-CM | POA: Insufficient documentation

## 2013-02-21 DIAGNOSIS — Y9389 Activity, other specified: Secondary | ICD-10-CM | POA: Insufficient documentation

## 2013-02-21 DIAGNOSIS — F329 Major depressive disorder, single episode, unspecified: Secondary | ICD-10-CM | POA: Insufficient documentation

## 2013-02-21 DIAGNOSIS — Y929 Unspecified place or not applicable: Secondary | ICD-10-CM | POA: Insufficient documentation

## 2013-02-21 DIAGNOSIS — W57XXXA Bitten or stung by nonvenomous insect and other nonvenomous arthropods, initial encounter: Secondary | ICD-10-CM | POA: Insufficient documentation

## 2013-02-21 DIAGNOSIS — Z8659 Personal history of other mental and behavioral disorders: Secondary | ICD-10-CM | POA: Insufficient documentation

## 2013-02-21 DIAGNOSIS — R21 Rash and other nonspecific skin eruption: Secondary | ICD-10-CM | POA: Insufficient documentation

## 2013-02-21 NOTE — ED Notes (Signed)
C/o bite/abcsess, redness, pain & burning to R upper arm, onset yesterday, gradually progressively worse, has tried benadryl and bacitracin w/o relief or improvement, (denies: itching, cramping), mentions lancing abscess today, was seen at [facility] yesterday.

## 2013-02-22 ENCOUNTER — Emergency Department (HOSPITAL_COMMUNITY)
Admission: EM | Admit: 2013-02-22 | Discharge: 2013-02-22 | Disposition: A | Discharge status: Home / Self Care | Payer: BC Managed Care – PPO | Attending: Emergency Medicine | Admitting: Emergency Medicine

## 2013-02-22 DIAGNOSIS — L03113 Cellulitis of right upper limb: Secondary | ICD-10-CM

## 2013-02-22 MED ORDER — CEPHALEXIN 500 MG PO CAPS: 1000.0000 mg | ORAL_CAPSULE | Freq: Two times a day (BID) | ORAL | Status: DC

## 2013-02-22 NOTE — ED Provider Notes (Signed)
Medical screening examination/treatment/procedure(s) were performed by non-physician practitioner and as supervising physician I was immediately available for consultation/collaboration.  Jones Skene, M.D.     Jones Skene, MD 02/22/13 [phone]

## 2013-02-22 NOTE — ED Provider Notes (Signed)
CSN: [id number]     Arrival date & time 02/21/13  2324 History     None    Chief Complaint  Patient presents with  . Insect Bite   (Consider location/radiation/quality/duration/timing/severity/associated sxs/prior Treatment) HPI History provided by pt and prior chart.  Pt presents w/ painful rash of right upper arm.  Onset yesterday and has gradually spread.  No associated fever or drainage.  Denies trauma.  Per prior chart, pt seen same in ED yesterday and was diagnosed w/ flea bites.  There was no cellulitis on exam at that time.  Husband with similar bites.  They have a dog that is treated for fleas regularly and does not sleep in their bed.   Past Medical History  Diagnosis Date  . DUB (dysfunctional uterine bleeding)   . ADD (attention deficit disorder with hyperactivity)   . Eczema   . Depression   . HPV (human papilloma virus) infection    Past Surgical History  Procedure Laterality Date  . Toe surgery     Family History  Problem Relation Age of Onset  . Cancer Other     breast  . ADD / ADHD Other    History  Substance Use Topics  . Smoking status: Never Smoker   . Smokeless tobacco: Never Used  . Alcohol Use: No   OB History   Grav Para Term Preterm Abortions TAB SAB Ect Mult Living                 Review of Systems  All other systems reviewed and are negative.    Allergies  Review of patient's allergies indicates no known allergies.  Home Medications   Current Outpatient Rx  Name  Route  Sig  Dispense  Refill  . Oxcarbazepine (TRILEPTAL) 300 MG tablet   Oral   Take 300 mg by mouth at bedtime.         . traZODone (DESYREL) 150 MG tablet   Oral   Take 150 mg by mouth at bedtime as needed for sleep.          . cephALEXin (KEFLEX) 500 MG capsule   Oral   Take 2 capsules (1,000 mg total) by mouth 2 (two) times daily.   28 capsule   0   . EXPIRED: metoCLOPramide (REGLAN) 10 MG tablet   Oral   Take 1 tablet (10 mg total) by mouth every 6  (six) hours as needed (nausea or headache).   30 tablet   0   . EXPIRED: promethazine (PHENERGAN) 12.5 MG tablet   Oral   Take 12.5 mg by mouth every 6 (six) hours as needed. For nausea          BP 115/57  Temp(Src) 98.3 F (36.8 C) (Oral)  Resp 18  SpO2 98%  LMP 02/06/2013 Physical Exam  Nursing note and vitals reviewed. Constitutional: She is oriented to person, place, and time. She appears well-developed and well-nourished. No distress.  HENT:  Head: Normocephalic and atraumatic.  Eyes:  Normal appearance  Neck: Normal range of motion.  Pulmonary/Chest: Effort normal.  Musculoskeletal: Normal range of motion.  Neurological: She is alert and oriented to person, place, and time.  Skin:  5cm area of well-demarcated erythema and warmth on extensor surface of right upper arm.  Tiny vesicular lesion that is non-draining and surrounding by 1.5cm induration in the middle.  Very ttp.  Visualized w/ bedside US and there is no subcutaneous fluid collection.  There is a chain of three  2-25mm shallow ulcerations just inferior to the cellulitis, and then multiple scattered lesions with similar appearance on RUE and RLE.   Psychiatric: She has a normal mood and affect. Her behavior is normal.    ED Course   Procedures (including critical care time)  Labs Reviewed - No data to display No results found. 1. Cellulitis of right arm     MDM  22yo healthy F diagnosed w/ flea bites in ED yesterday.  Rash is consistent w/ fleas.  Today she presents w/ what appears to be secondary cellulitis at the site of one of the bites.  Visualized w/ bedside US and there is no subq fluid collection to drain at this time.  Prescribed keflex and recommended treating dog for fleas and washing on sheets and clothing w/ hot water.  Nursing staff drew margins of erythema.  Return precautions discussed. 1:24 AM   Otilio Miu, PA-C 02/22/13 [phone]

## 2013-02-22 NOTE — ED Notes (Signed)
Pt reports being bitten in right upper arm yesterday am. Micah Flesher to [facility] and was diagnosed with flea bite. States arm has gotten progressively swollen and more painful.

## 2013-02-23 ENCOUNTER — Emergency Department (HOSPITAL_COMMUNITY)
Admission: EM | Admit: 2013-02-23 | Discharge: 2013-02-24 | Disposition: A | Discharge status: Home / Self Care | Payer: BC Managed Care – PPO | Attending: Emergency Medicine | Admitting: Emergency Medicine

## 2013-02-23 ENCOUNTER — Encounter (HOSPITAL_COMMUNITY): Payer: Self-pay | Admitting: *Deleted

## 2013-02-23 DIAGNOSIS — R21 Rash and other nonspecific skin eruption: Secondary | ICD-10-CM | POA: Insufficient documentation

## 2013-02-23 DIAGNOSIS — Z8742 Personal history of other diseases of the female genital tract: Secondary | ICD-10-CM | POA: Insufficient documentation

## 2013-02-23 DIAGNOSIS — L0390 Cellulitis, unspecified: Secondary | ICD-10-CM

## 2013-02-23 DIAGNOSIS — IMO0002 Reserved for concepts with insufficient information to code with codable children: Secondary | ICD-10-CM | POA: Insufficient documentation

## 2013-02-23 DIAGNOSIS — Z8619 Personal history of other infectious and parasitic diseases: Secondary | ICD-10-CM | POA: Insufficient documentation

## 2013-02-23 DIAGNOSIS — L0291 Cutaneous abscess, unspecified: Secondary | ICD-10-CM

## 2013-02-23 DIAGNOSIS — Z872 Personal history of diseases of the skin and subcutaneous tissue: Secondary | ICD-10-CM | POA: Insufficient documentation

## 2013-02-23 DIAGNOSIS — Z8659 Personal history of other mental and behavioral disorders: Secondary | ICD-10-CM | POA: Insufficient documentation

## 2013-02-23 MED ORDER — HYDROMORPHONE HCL PF 1 MG/ML IJ SOLN
1.0000 mg | Freq: Once | INTRAMUSCULAR | Status: AC
  Administered 2013-02-23: 1 mg via INTRAVENOUS
  Filled 2013-02-23: qty 1

## 2013-02-23 MED ORDER — ONDANSETRON HCL 4 MG/2ML IJ SOLN
4.0000 mg | Freq: Once | INTRAMUSCULAR | Status: AC
  Administered 2013-02-23: 4 mg via INTRAVENOUS
  Filled 2013-02-23: qty 2

## 2013-02-23 MED ORDER — CLINDAMYCIN PHOSPHATE 600 MG/50ML IV SOLN
600.0000 mg | Freq: Once | INTRAVENOUS | Status: AC
  Administered 2013-02-23: 600 mg via INTRAVENOUS
  Filled 2013-02-23: qty 50

## 2013-02-23 NOTE — ED Provider Notes (Signed)
CSN: [id number]     Arrival date & time 02/23/13  2210 History     First MD Initiated Contact with Patient 02/23/13 2317     Chief Complaint  Patient presents with  . Arm Pain   (Consider location/radiation/quality/duration/timing/severity/associated sxs/prior Treatment) HPI HX per PT  Onset 3 days ago R arm bump worsening the next day with swelling, pain and increased redness, treated for cellulitis at that time. She has been taking ABx with worsening pain, spreading redness and now pointing, no drainage. No F/C, no N/V. She has a few other small lesions on her arm that she has been itching, no h/o MRSA, is taking Keflex and BActrim. PT requesting IV ABx, plans to travel this coming week. Taking motrin and wants somethiong stronger.    Past Medical History  Diagnosis Date  . DUB (dysfunctional uterine bleeding)   . ADD (attention deficit disorder with hyperactivity)   . Eczema   . Depression   . HPV (human papilloma virus) infection    Past Surgical History  Procedure Laterality Date  . Toe surgery     Family History  Problem Relation Age of Onset  . Cancer Other     breast  . ADD / ADHD Other    History  Substance Use Topics  . Smoking status: Never Smoker   . Smokeless tobacco: Never Used  . Alcohol Use: No   OB History   Grav Para Term Preterm Abortions TAB SAB Ect Mult Living                 Review of Systems  Constitutional: Negative for fever and chills.  HENT: Negative for neck pain and neck stiffness.   Eyes: Negative for visual disturbance.  Respiratory: Negative for shortness of breath.   Cardiovascular: Negative for chest pain.  Gastrointestinal: Negative for vomiting and abdominal pain.  Genitourinary: Negative for flank pain.  Musculoskeletal: Negative for back pain.  Skin: Positive for rash and wound.  Neurological: Negative for headaches.  All other systems reviewed and are negative.    Allergies  Review of patient's allergies indicates no  known allergies.  Home Medications   Current Outpatient Rx  Name  Route  Sig  Dispense  Refill  . cephALEXin (KEFLEX) 500 MG capsule   Oral   Take 2 capsules (1,000 mg total) by mouth 2 (two) times daily.   28 capsule   0   . ibuprofen (ADVIL,MOTRIN) 200 MG tablet   Oral   Take 800 mg by mouth every 6 (six) hours as needed for pain.         Marland Kitchen sulfamethoxazole-trimethoprim (BACTRIM DS) 800-160 MG per tablet   Oral   Take 1 tablet by mouth 2 (two) times daily.         Marland Kitchen EXPIRED: metoCLOPramide (REGLAN) 10 MG tablet   Oral   Take 1 tablet (10 mg total) by mouth every 6 (six) hours as needed (nausea or headache).   30 tablet   0   . EXPIRED: promethazine (PHENERGAN) 12.5 MG tablet   Oral   Take 12.5 mg by mouth every 6 (six) hours as needed. For nausea          BP 121/68  Pulse 79  Temp(Src) 98.3 F (36.8 C) (Oral)  Resp 16  SpO2 99%  LMP 02/06/2013 Physical Exam  Constitutional: She is oriented to person, place, and time. She appears well-developed and well-nourished.  HENT:  Head: Normocephalic and atraumatic.  Mouth/Throat: Oropharynx is clear  and moist.  Eyes: EOM are normal. Pupils are equal, round, and reactive to light.  Neck: Neck supple.  Cardiovascular: Normal rate, regular rhythm and intact distal pulses.   Pulmonary/Chest: Effort normal. No respiratory distress.  Abdominal: Soft. There is no tenderness.  Musculoskeletal: Normal range of motion.  RUE with area of tenderness, erythema, inc warmth to touch and pointing fluctuance over tricep area, there are 2 superficial lesions proximal to this without fluctuance or swelling  Neurological: She is alert and oriented to person, place, and time.  Skin: Skin is warm and dry.    ED Course   INCISION AND DRAINAGE Date/Time: 02/24/2013 12:12 AM Performed by: Sunnie Nielsen Authorized by: Sunnie Nielsen Consent: Verbal consent obtained. Risks and benefits: risks, benefits and alternatives were  discussed Consent given by: patient Patient understanding: patient states understanding of the procedure being performed Patient consent: the patient's understanding of the procedure matches consent given Procedure consent: procedure consent matches procedure scheduled Required items: required blood products, implants, devices, and special equipment available Patient identity confirmed: verbally with patient Time out: Immediately prior to procedure a "time out" was called to verify the correct patient, procedure, equipment, support staff and site/side marked as required. Type: abscess Body area: upper extremity Location details: right arm Anesthesia: local infiltration Local anesthetic: lidocaine 1% with epinephrine Anesthetic total: 2 ml Risk factor: underlying major vessel, underlying major nerve and coagulopathy Scalpel size: 11 Needle gauge: 22 Incision type: single straight Complexity: complex Drainage: purulent Drainage amount: copious Wound treatment: wound left open Patient tolerance: Patient tolerated the procedure well with no immediate complications.   (including critical care time)  IV Dilaudid IV Clindamycin  Plan d/c home, 48 hour return precautions, continue ABx as prescribed  MDM  RUE abscess with I/D  IV narcotics pain control  VS, prior ED records and nurses notes reviewed  Sunnie Nielsen, MD 02/24/13 [phone]

## 2013-02-23 NOTE — ED Notes (Signed)
The pt has an infection in her rt arm since Thursday.  She was seen at [facility] and in this ed for the same.  She  Reports that they think she has [place]

## 2013-02-24 ENCOUNTER — Telehealth: Payer: Self-pay | Admitting: Family Medicine

## 2013-02-24 ENCOUNTER — Ambulatory Visit: Payer: Self-pay | Admitting: Family Medicine

## 2013-02-24 ENCOUNTER — Encounter (HOSPITAL_COMMUNITY): Payer: Self-pay | Admitting: *Deleted

## 2013-02-24 ENCOUNTER — Emergency Department (HOSPITAL_COMMUNITY)
Admission: EM | Admit: 2013-02-24 | Discharge: 2013-02-24 | Disposition: A | Discharge status: Home / Self Care | Payer: BC Managed Care – PPO | Attending: Emergency Medicine | Admitting: Emergency Medicine

## 2013-02-24 DIAGNOSIS — Z872 Personal history of diseases of the skin and subcutaneous tissue: Secondary | ICD-10-CM | POA: Insufficient documentation

## 2013-02-24 DIAGNOSIS — L0291 Cutaneous abscess, unspecified: Secondary | ICD-10-CM

## 2013-02-24 DIAGNOSIS — Z8659 Personal history of other mental and behavioral disorders: Secondary | ICD-10-CM | POA: Insufficient documentation

## 2013-02-24 DIAGNOSIS — Z8742 Personal history of other diseases of the female genital tract: Secondary | ICD-10-CM | POA: Insufficient documentation

## 2013-02-24 DIAGNOSIS — Z8619 Personal history of other infectious and parasitic diseases: Secondary | ICD-10-CM | POA: Insufficient documentation

## 2013-02-24 DIAGNOSIS — IMO0002 Reserved for concepts with insufficient information to code with codable children: Secondary | ICD-10-CM | POA: Insufficient documentation

## 2013-02-24 MED ORDER — TRAMADOL HCL 50 MG PO TABS: 50.0000 mg | ORAL_TABLET | Freq: Four times a day (QID) | ORAL | Status: DC | PRN

## 2013-02-24 MED ORDER — ONDANSETRON HCL 4 MG PO TABS: 4.0000 mg | ORAL_TABLET | Freq: Four times a day (QID) | ORAL | Status: DC

## 2013-02-24 MED ORDER — HYDROCODONE-ACETAMINOPHEN 5-325 MG PO TABS: 1.0000 | ORAL_TABLET | Freq: Four times a day (QID) | ORAL | Status: DC | PRN

## 2013-02-24 MED ORDER — LIDOCAINE HCL 2 % IJ SOLN: 10.0000 mL | Freq: Once | INTRAMUSCULAR | Status: DC

## 2013-02-24 NOTE — ED Notes (Signed)
MD at bedside. 

## 2013-02-24 NOTE — ED Notes (Signed)
Pt in c/o abscess to right upper arm, pt has been seen several times for this over the last few days, last seen last night and had I and D completed and given IV antibiotic, pt states she was told that the pressure would be relieved but today the swelling had returned

## 2013-02-24 NOTE — ED Notes (Signed)
Discharge instructions and information provided. Patient verbalized understanding & will f/u with PCP in 2 days

## 2013-02-24 NOTE — Telephone Encounter (Signed)
[facility] Triage Call Report Triage Record Num: [id number] Operator: Judeen Hammans Patient Name: [name] Call Date & Time: 02/23/2013 9:53:09PM Patient Phone: [phone] PCP: Gordy Savers Patient Gender: Female PCP Fax : [phone] Patient DOB: 03-Mar-1991 [facility] Name: [facility] Reason for Call: Caller: [name]; PCP: Kelle Darting (Family Practice); CB#: [phone]; Call regarding Insect bite on right upper arm, swelling and red; Onset 7/24. Seen at [facility] in ED on 7/25 for this and given Rx for Cephalexin, Bactrim to treat MRSA/Cellulitis. Despite 2 days of antibiotic therapy area on arm continues to increase in size. Having severe pain despite Ibuprofen. All emergent sxs ruled out per Bites and Stings - Insects or Spiders Guideline except for "Severe pain and not relieved after 2 hours of care." Advised to go to ED per disp to see provider in 4 hrs. Pt on the way to [facility] ED for evaluation. Protocol(s) Used: Bites and Stings - Insects or Spiders Recommended Outcome per Protocol: See Provider within 4 hours Reason for Outcome: Severe pain AND not relieved after 2 hours of care Care Advice: ~ 07/

## 2013-02-24 NOTE — Telephone Encounter (Signed)
Patient Information:  Caller Name: Elita Quick  Phone: [phone]  Patient: [name], [place]  Gender: Female  DOB: 21-Jan-1991  Age: 22 Years  PCP: Kelle Darting [facility])  Pregnant: No  Office Follow Up:  Does the office need to follow up with this patient?: No  Instructions For The Office: N/A  RN Note:  Pt was diagosed w/ Cellulitis on 7-25 at ED for Wound in Right Arm, wound worsen, enlarged went back to ED on 7-27, diagnosed w/ Abscess, L&D performed, IV antibiotics given.  Would continues to worsen, is now hard, swelling again w/ redness spreading down Arm, simular to when she was seen at ED. Appt offered, Mom lives 30 mins away unable to make 1615 appt w/ Dr Clent Ridges, no availability w/ Padona Campbel.  Advised Mom to have Pt seen at [facility] or ED.  Mom will not go back to [facility], said they mis-diagnosed her first time.   Symptoms  Reason For Call & Symptoms: Wound I&D F/U  Reviewed Health History In EMR: Yes  Reviewed Medications In EMR: Yes  Reviewed Allergies In EMR: Yes  Reviewed Surgeries / Procedures: Yes  Date of Onset of Symptoms: 02/21/2013  Treatments Tried: Antibiotics  Treatments Tried Worked: No OB / GYN:  LMP: Unknown  Guideline(s) Used:  Wound Infection  Disposition Per Guideline:   Go to Office Now  Reason For Disposition Reached:   Severe pain in the wound  Advice Given:  N/A  Patient Will Follow Care Advice:  YES  Appointment Scheduled:  02/24/2013 16:15:00 Appointment Scheduled Provider:  Gershon Crane [facility])

## 2013-02-24 NOTE — ED Provider Notes (Signed)
CSN: [id number]     Arrival date & time 02/24/13  1644 History     First MD Initiated Contact with Patient 02/24/13 1750     Chief Complaint  Patient presents with  . Abscess   (Consider location/radiation/quality/duration/timing/severity/associated sxs/prior Treatment) HPI Comments: Progress right upper arm. Patient was seen yesterday and had incision and drainage. Patient reports that initially the swelling went down, but today she has had increased pain and swelling. Drainage has stopped. Redness has extended. No  Fever, Pain moderate to severe.  Patient is a 22 y.o. female presenting with abscess.  Abscess Associated symptoms: no fever     Past Medical History  Diagnosis Date  . DUB (dysfunctional uterine bleeding)   . ADD (attention deficit disorder with hyperactivity)   . Eczema   . Depression   . HPV (human papilloma virus) infection    Past Surgical History  Procedure Laterality Date  . Toe surgery     Family History  Problem Relation Age of Onset  . Cancer Other     breast  . ADD / ADHD Other    History  Substance Use Topics  . Smoking status: Never Smoker   . Smokeless tobacco: Never Used  . Alcohol Use: No   OB History   Grav Para Term Preterm Abortions TAB SAB Ect Mult Living                 Review of Systems  Constitutional: Negative for fever.  Skin: Positive for wound.    Allergies  Review of patient's allergies indicates no known allergies.  Home Medications   Current Outpatient Rx  Name  Route  Sig  Dispense  Refill  . cephALEXin (KEFLEX) 500 MG capsule   Oral   Take 2 capsules (1,000 mg total) by mouth 2 (two) times daily.   28 capsule   0   . HYDROcodone-acetaminophen (NORCO/VICODIN) 5-325 MG per tablet   Oral   Take 1 tablet by mouth every 6 (six) hours as needed for pain.   6 tablet   0   . ibuprofen (ADVIL,MOTRIN) 200 MG tablet   Oral   Take 800 mg by mouth every 6 (six) hours as needed for pain.         Marland Kitchen ondansetron  (ZOFRAN) 4 MG tablet   Oral   Take 1 tablet (4 mg total) by mouth every 6 (six) hours.   12 tablet   0   . sulfamethoxazole-trimethoprim (BACTRIM DS) 800-160 MG per tablet   Oral   Take 1 tablet by mouth 2 (two) times daily.          BP 114/73  Pulse 106  Temp(Src) 98.4 F (36.9 C) (Oral)  Resp 20  Wt 178 lb (80.74 kg)  BMI 28.3 kg/m2  SpO2 100%  LMP 02/06/2013 Physical Exam  Constitutional: She is oriented to person, place, and time. She appears well-developed and well-nourished. No distress.  HENT:  Head: Normocephalic and atraumatic.  Right Ear: Hearing normal.  Left Ear: Hearing normal.  Nose: Nose normal.  Mouth/Throat: Oropharynx is clear and moist and mucous membranes are normal.  Eyes: Conjunctivae and EOM are normal. Pupils are equal, round, and reactive to light.  Neck: Normal range of motion. Neck supple.  Cardiovascular: Regular rhythm, S1 normal and S2 normal.  Exam reveals no gallop and no friction rub.   No murmur heard. Pulmonary/Chest: Effort normal and breath sounds normal. No respiratory distress. She exhibits no tenderness.  Abdominal: Soft. Normal  appearance and bowel sounds are normal. There is no hepatosplenomegaly. There is no tenderness. There is no rebound, no guarding, no tenderness at McBurney's point and negative Murphy's sign. No hernia.  Musculoskeletal: Normal range of motion.  Neurological: She is alert and oriented to person, place, and time. She has normal strength. No cranial nerve deficit or sensory deficit. Coordination normal. GCS eye subscore is 4. GCS verbal subscore is 5. GCS motor subscore is 6.  Skin: Skin is warm, dry and intact. No rash noted. No cyanosis.     Psychiatric: She has a normal mood and affect. Her speech is normal and behavior is normal. Thought content normal.    ED Course   Procedures (including critical care time)  Labs Reviewed - No data to display No results found.  Diagnosis: Skin abscess with  cellulitis  MDM  She returns for evaluation of skin abscess. Patient had incision and drainage last night. She feels like area is more swollen last few hours. Patient has 1.5 cm of induration to the lateral aspect of the I&D site, surrounding erythema just minimally beyond the markings from the previous day. There is minimal purulent drainage. I recommended extending the incision and drainage as the area appears to be closing superficially. She did consent to this. Extension laterally from the original site was performed. The area was proved and there were no loculations, but there was a tract encountered going laterally from the original wound. This area was packed. Patient will continue her current antibiotics. Packing removal in 2 days. She will make an appointment her doctor. Return to the ER if her symptoms worsen.  Gilda Crease, MD 02/24/13 Barry Brunner

## 2013-02-24 NOTE — ED Notes (Signed)
Pt has abscess to lateral RUE. Purulent drainage. Redness. I&D to area yesterday evening. Swelling.

## 2013-02-26 ENCOUNTER — Encounter: Payer: Self-pay | Admitting: Family Medicine

## 2013-02-26 ENCOUNTER — Ambulatory Visit (INDEPENDENT_AMBULATORY_CARE_PROVIDER_SITE_OTHER): Payer: BC Managed Care – PPO | Admitting: Family Medicine

## 2013-02-26 VITALS — BP 100/60 | HR 81 | Temp 98.7°F | Wt 180.0 lb

## 2013-02-26 DIAGNOSIS — IMO0002 Reserved for concepts with insufficient information to code with codable children: Secondary | ICD-10-CM

## 2013-02-26 MED ORDER — SULFAMETHOXAZOLE-TMP DS 800-160 MG PO TABS: 1.0000 | ORAL_TABLET | Freq: Two times a day (BID) | ORAL | Status: DC

## 2013-02-26 MED ORDER — HYDROCODONE-ACETAMINOPHEN 5-325 MG PO TABS: 1.0000 | ORAL_TABLET | Freq: Four times a day (QID) | ORAL | Status: DC | PRN

## 2013-02-26 MED ORDER — CEPHALEXIN 500 MG PO CAPS: 1000.0000 mg | ORAL_CAPSULE | Freq: Four times a day (QID) | ORAL | Status: DC

## 2013-02-26 NOTE — Progress Notes (Signed)
  Subjective:    Patient ID: [name], female    DOB: [birth year], 22 y.o.   MRN: [id number]  HPI Here to recheck an abscess on the right upper arm. This started as a small pimple about a week ago and rapidly got larger and more painful. She has been to the ER several times and has had it lanced twice. The last time was 2 days ago, and it was packed with gauze that time. She has been taking Cephalexin and Septra for a week. No fevers. Her arm has improved quite a bit. For some reason no culture was obtained. It was felt to be probable MRSA. Her boyfriend developed a similar lesion on his abdomen, and he is responding well to these same antibiotics.    Review of Systems  Constitutional: Negative.   Skin: Positive for wound.       Objective:   Physical Exam  Constitutional: She appears well-developed and well-nourished.  Musculoskeletal:  The upper right arm has a deep wound which looks clean and is only mildly tender. All packing was removed.           Assessment & Plan:  Probable MRSA abscess. We will give her another 10 days of both antibiotics. She will clean the wound daily with soap and water and then dress it with dry gauze. Of note she is leaving for a mission trip to [place], [place] tomorrow and will return in one week. I advised her to avoid taking any baths or swimming there.

## 2013-03-24 ENCOUNTER — Other Ambulatory Visit: Payer: Self-pay | Admitting: Family Medicine

## 2013-05-21 ENCOUNTER — Other Ambulatory Visit: Payer: Self-pay | Admitting: Obstetrics and Gynecology

## 2013-08-21 ENCOUNTER — Ambulatory Visit: Payer: BC Managed Care – PPO

## 2013-08-22 ENCOUNTER — Ambulatory Visit (INDEPENDENT_AMBULATORY_CARE_PROVIDER_SITE_OTHER): Payer: BC Managed Care – PPO

## 2013-08-22 ENCOUNTER — Ambulatory Visit: Payer: BC Managed Care – PPO

## 2013-08-22 DIAGNOSIS — Z23 Encounter for immunization: Secondary | ICD-10-CM

## 2014-07-31 DIAGNOSIS — J189 Pneumonia, unspecified organism: Secondary | ICD-10-CM

## 2014-07-31 HISTORY — DX: Pneumonia, unspecified organism: J18.9

## 2014-08-25 ENCOUNTER — Telehealth: Payer: Self-pay | Admitting: Family Medicine

## 2014-08-25 NOTE — Telephone Encounter (Signed)
[facility] - Client TELEPHONE ADVICE RECORD [facility] Patient Name: Kaylee PilgrimCARLIE SUGGS DOB: [birth year] Initial Comment Caller states her daughter has the flu and can they get the Tamiflu. She does not have a fever yet, but aches and pains, Headache. Nurse Assessment Nurse: Apolinar JunesBrandon, RN, Darl PikesSusan Date/Time Lamount Cohen(Eastern Time): 08/25/2014 1:51:29 PM Confirm and document reason for call. If symptomatic, describe symptoms. ---Caller states her daughter has the flu and can they get the Tamiflu - She does not have a fever yet, but aches and pains, Headache - she is there with her dgt, she is laying on the couch not feeling good - just woke up this morning not feeling good - has not taken any Tylenol or Advil Has the patient traveled out of the country within the last 30 days? ---Not Applicable Does the patient require triage? ---Yes Related visit to physician within the last 2 weeks? ---No Does the PT have any chronic conditions? (i.e. diabetes, asthma, etc.) ---No Did the patient indicate they were pregnant? ---No Guidelines Guideline Title Affirmed Question Affirmed Notes Weakness (Generalized) and Fatigue Mild weakness or fatigue with acute minor illness (e.g., colds) (all triage questions negative) Final Disposition User Home Care Apolinar JunesBrandon, RN, Darl PikesSusan Comments request appointment even though Home Care outcome

## 2014-08-25 NOTE — Telephone Encounter (Signed)
Left message on machine for patient, per Dr Tawanna Coolerodd - he does not prescribe Tamiflu.  Patient should drink plenty of fluid, bed rest, and Tylenol as needed.

## 2014-08-26 ENCOUNTER — Ambulatory Visit: Payer: Self-pay | Admitting: Internal Medicine

## 2015-01-21 ENCOUNTER — Encounter: Payer: Self-pay | Admitting: Family Medicine

## 2015-01-21 ENCOUNTER — Ambulatory Visit (INDEPENDENT_AMBULATORY_CARE_PROVIDER_SITE_OTHER): Payer: BLUE CROSS/BLUE SHIELD | Admitting: Family Medicine

## 2015-01-21 VITALS — BP 98/72 | HR 88 | Temp 97.7°F | Ht 66.5 in | Wt 202.2 lb

## 2015-01-21 DIAGNOSIS — L0292 Furuncle, unspecified: Secondary | ICD-10-CM

## 2015-01-21 MED ORDER — DOXYCYCLINE HYCLATE 100 MG PO TABS: 100.0000 mg | ORAL_TABLET | Freq: Two times a day (BID) | ORAL | Status: DC

## 2015-01-21 NOTE — Progress Notes (Signed)
Pre visit review using our clinic review tool, if applicable. No additional management support is needed unless otherwise documented below in the visit note. 

## 2015-01-21 NOTE — Progress Notes (Signed)
  HPI:  Acute visit for:  Boil: -started on inner R thigh about 1 week ago -put essential oils on it and now improving -denies: worsening, fevers, chills, malaise, drainage -hx of boil on arm in the past with cellulitis and is very worried about this  ROS: See pertinent positives and negatives per HPI.  Past Medical History  Diagnosis Date  . DUB (dysfunctional uterine bleeding)   . ADD (attention deficit disorder with hyperactivity)   . Eczema   . Depression   . HPV (human papilloma virus) infection     Past Surgical History  Procedure Laterality Date  . Toe surgery      Family History  Problem Relation Age of Onset  . Cancer Other     breast  . ADD / ADHD Other     History   Social History  . Marital Status: Single    Spouse Name: N/A  . Number of Children: N/A  . Years of Education: N/A   Social History Main Topics  . Smoking status: Never Smoker   . Smokeless tobacco: Never Used  . Alcohol Use: No  . Drug Use: No  . Sexual Activity: No   Other Topics Concern  . None   Social History Narrative     Current outpatient prescriptions:  .  amphetamine-dextroamphetamine (ADDERALL XR) 20 MG 24 hr capsule, Take 20 mg by mouth daily., Disp: , Rfl: 0 .  FOCALIN XR 25 MG CP24, Take 1 capsule by mouth every morning., Disp: , Rfl: 0 .  doxycycline (VIBRA-TABS) 100 MG tablet, Take 1 tablet (100 mg total) by mouth 2 (two) times daily., Disp: 10 tablet, Rfl: 0  EXAM:  Filed Vitals:   01/21/15 1511  BP: 98/72  Pulse: 88  Temp: 97.7 F (36.5 C)    Body mass index is 32.15 kg/(m^2).  GENERAL: vitals reviewed and listed above, alert, oriented, appears well hydrated and in no acute distress  HEENT: atraumatic, conjunttiva clear, no obvious abnormalities on inspection of external nose and ears  NECK: no obvious masses on inspection  SKIN: very small boil on R inner thigh, no signs of abscess or cellulitis  MS: moves all extremities without noticeable  abnormality  PSYCH: pleasant and cooperative, no obvious depression or anxiety  ASSESSMENT AND PLAN:  Discussed the following assessment and plan:  Boil - Plan: doxycycline (VIBRA-TABS) 100 MG tablet  -no signs of cellulitis or fluctuance -compresses -return precuations -Patient advised to return or notify a doctor immediately if symptoms worsen or persist or new concerns arise.  There are no Patient Instructions on file for this visit.   Kriste Basque R.

## 2015-04-19 ENCOUNTER — Other Ambulatory Visit: Payer: Self-pay | Admitting: Obstetrics and Gynecology

## 2015-04-20 LAB — CYTOLOGY - PAP

## 2015-08-07 ENCOUNTER — Emergency Department (HOSPITAL_COMMUNITY)
Admission: EM | Admit: 2015-08-07 | Discharge: 2015-08-07 | Disposition: A | Discharge status: Home / Self Care | Payer: BLUE CROSS/BLUE SHIELD | Attending: Emergency Medicine | Admitting: Emergency Medicine

## 2015-08-07 ENCOUNTER — Encounter (HOSPITAL_COMMUNITY): Payer: Self-pay | Admitting: Emergency Medicine

## 2015-08-07 ENCOUNTER — Emergency Department (HOSPITAL_COMMUNITY): Payer: BLUE CROSS/BLUE SHIELD

## 2015-08-07 DIAGNOSIS — Y998 Other external cause status: Secondary | ICD-10-CM | POA: Insufficient documentation

## 2015-08-07 DIAGNOSIS — S63502A Unspecified sprain of left wrist, initial encounter: Secondary | ICD-10-CM | POA: Diagnosis not present

## 2015-08-07 DIAGNOSIS — Z79899 Other long term (current) drug therapy: Secondary | ICD-10-CM | POA: Insufficient documentation

## 2015-08-07 DIAGNOSIS — Z792 Long term (current) use of antibiotics: Secondary | ICD-10-CM | POA: Insufficient documentation

## 2015-08-07 DIAGNOSIS — S6992XA Unspecified injury of left wrist, hand and finger(s), initial encounter: Secondary | ICD-10-CM | POA: Diagnosis present

## 2015-08-07 DIAGNOSIS — Y9323 Activity, snow (alpine) (downhill) skiing, snow boarding, sledding, tobogganing and snow tubing: Secondary | ICD-10-CM | POA: Diagnosis not present

## 2015-08-07 DIAGNOSIS — F909 Attention-deficit hyperactivity disorder, unspecified type: Secondary | ICD-10-CM | POA: Diagnosis not present

## 2015-08-07 DIAGNOSIS — Z8619 Personal history of other infectious and parasitic diseases: Secondary | ICD-10-CM | POA: Diagnosis not present

## 2015-08-07 DIAGNOSIS — Y9289 Other specified places as the place of occurrence of the external cause: Secondary | ICD-10-CM | POA: Insufficient documentation

## 2015-08-07 DIAGNOSIS — Z872 Personal history of diseases of the skin and subcutaneous tissue: Secondary | ICD-10-CM | POA: Insufficient documentation

## 2015-08-07 DIAGNOSIS — S60212A Contusion of left wrist, initial encounter: Secondary | ICD-10-CM | POA: Insufficient documentation

## 2015-08-07 DIAGNOSIS — Z8742 Personal history of other diseases of the female genital tract: Secondary | ICD-10-CM | POA: Diagnosis not present

## 2015-08-07 MED ORDER — IBUPROFEN 800 MG PO TABS
800.0000 mg | ORAL_TABLET | Freq: Once | ORAL | Status: AC
  Administered 2015-08-07: 800 mg via ORAL
  Filled 2015-08-07: qty 1

## 2015-08-07 NOTE — ED Notes (Signed)
Per patient states left wrist injury while sledding today

## 2015-08-07 NOTE — ED Provider Notes (Signed)
CSN: [id number]     Arrival date & time 08/07/15  1858 History   First MD Initiated Contact with Patient 08/07/15 1948     Chief Complaint  Patient presents with  . Wrist Injury     (Consider location/radiation/quality/duration/timing/severity/associated sxs/prior Treatment) Patient is a 25 y.o. female presenting with wrist injury. The history is provided by the patient and medical records. No language interpreter was used.  Wrist Injury Associated symptoms: no back pain, no fever and no neck pain     [name] is a 25 y.o. female  with no major medical history presents to the Emergency Department complaining of acute, persistent left wrist pain and swelling after a sledding accident at around 4 PM. Patient reports she thought she was going to crash and put her left hand out to stop herself causing the injury.  Patient denies numbness, tingling, weakness in the left hand. She reports she is right-handed.  No treatments prior to arrival. Nothing makes it better and movement makes it worse.   Past Medical History  Diagnosis Date  . DUB (dysfunctional uterine bleeding)   . ADD (attention deficit disorder with hyperactivity)   . Eczema   . Depression   . HPV (human papilloma virus) infection    Past Surgical History  Procedure Laterality Date  . Toe surgery     Family History  Problem Relation Age of Onset  . Cancer Other     breast  . ADD / ADHD Other    Social History  Substance Use Topics  . Smoking status: Never Smoker   . Smokeless tobacco: Never Used  . Alcohol Use: No   OB History    No data available     Review of Systems  Constitutional: Negative for fever and chills.  Gastrointestinal: Negative for nausea and vomiting.  Musculoskeletal: Positive for joint swelling and arthralgias. Negative for back pain, neck pain and neck stiffness.  Skin: Negative for wound.  Neurological: Negative for numbness.  Hematological: Does not bruise/bleed easily.   Psychiatric/Behavioral: The patient is not nervous/anxious.   All other systems reviewed and are negative.     Allergies  Review of patient's allergies indicates no known allergies.  Home Medications   Prior to Admission medications   Medication Sig Start Date End Date Taking? Authorizing Provider  amphetamine-dextroamphetamine (ADDERALL XR) 20 MG 24 hr capsule Take 20 mg by mouth daily. 10/21/14   Historical Provider, MD  doxycycline (VIBRA-TABS) 100 MG tablet Take 1 tablet (100 mg total) by mouth 2 (two) times daily. 01/21/15   Terressa Koyanagi, DO  FOCALIN XR 25 MG CP24 Take 1 capsule by mouth every morning. 11/13/14   Historical Provider, MD   BP 114/77 mmHg  Pulse 78  Temp(Src) 97.7 F (36.5 C) (Oral)  Resp 16  SpO2 99%  LMP 07/03/2015 Physical Exam  Constitutional: She appears well-developed and well-nourished. No distress.  HENT:  Head: Normocephalic and atraumatic.  Eyes: Conjunctivae are normal.  Neck: Normal range of motion.  Cardiovascular: Normal rate, regular rhythm and intact distal pulses.   Capillary refill < 3 sec  Pulmonary/Chest: Effort normal and breath sounds normal.  Musculoskeletal: She exhibits tenderness. She exhibits no edema.  ROM: Slightly decreased range of motion of the left wrist with mild ecchymosis to the lateral side and tenderness to palpation along the joint line Full range of motion of all fingers of the left hand and elbow  Neurological: She is alert. Coordination normal.  Sensation intact to  dull and sharp Strength strong grip strength, 5/5 flexion and extension of the elbow, 4/5 flexion and extension at the wrist  Skin: Skin is warm and dry. She is not diaphoretic.  No tenting of the skin  Psychiatric: She has a normal mood and affect.  Nursing note and vitals reviewed.   ED Course  Procedures (including critical care time)  Imaging Review Dg Wrist Complete Left  08/07/2015  CLINICAL DATA:  Sledding accident with left wrist pain,  initial encounter EXAM: LEFT WRIST - COMPLETE 3+ VIEW COMPARISON:  None. FINDINGS: There is no evidence of fracture or dislocation. There is no evidence of arthropathy or other focal bone abnormality. Soft tissues are unremarkable. IMPRESSION: No acute abnormality noted. Electronically Signed   By: Alcide CleverMark  Lukens M.D.   On: 08/07/2015 19:29   I have personally reviewed and evaluated these images and lab results as part of my medical decision-making.    MDM   Final diagnoses:  Left wrist sprain, initial encounter   [name] presents with right wrist pain.  Patient X-Ray negative for obvious fracture or dislocation. Pain managed in ED. Pt advised to follow up with orthopedics if symptoms persist for possibility of missed fracture diagnosis. Patient given brace while in ED, conservative therapy recommended and discussed. Patient will be dc home & is agreeable with above plan.  BP 114/77 mmHg  Pulse 78  Temp(Src) 97.7 F (36.5 C) (Oral)  Resp 16  SpO2 99%  LMP 07/03/2015    Dahlia ClientHannah Kalyse Meharg, PA-C 08/07/15 2003  Lorre NickAnthony Allen, MD 08/11/15 [id number]

## 2015-08-07 NOTE — Discharge Instructions (Signed)

## 2015-12-31 ENCOUNTER — Ambulatory Visit (INDEPENDENT_AMBULATORY_CARE_PROVIDER_SITE_OTHER): Payer: BLUE CROSS/BLUE SHIELD | Admitting: Family Medicine

## 2015-12-31 ENCOUNTER — Encounter: Payer: Self-pay | Admitting: Family Medicine

## 2015-12-31 VITALS — BP 100/80 | HR 90 | Temp 98.1°F | Ht 66.5 in | Wt 223.3 lb

## 2015-12-31 DIAGNOSIS — J0100 Acute maxillary sinusitis, unspecified: Secondary | ICD-10-CM | POA: Diagnosis not present

## 2015-12-31 MED ORDER — AMOXICILLIN-POT CLAVULANATE 875-125 MG PO TABS: 1.0000 | ORAL_TABLET | Freq: Two times a day (BID) | ORAL | Status: DC

## 2015-12-31 NOTE — Patient Instructions (Signed)
Take the antibiotic as instructed. Follow up as needed if symptoms worsen or persist.

## 2015-12-31 NOTE — Progress Notes (Signed)
Pre visit review using our clinic review tool, if applicable. No additional management support is needed unless otherwise documented below in the visit note. 

## 2015-12-31 NOTE — Progress Notes (Signed)
   HPI:  -started: 2 weeks, now worsening -symptoms:nasal congestion, sore throat, cough, body aches - but has back issues, now maxillay sinus pain, upper tooth pain - with thick yellow mucus -denies:fever, SOB, NVD, rash -has tried: tylenol -sick contacts/travel/risks: no reported flu, strep or tick exposure -Hx of: sinus infection -has IUD, no chance of pregnancy  ROS: See pertinent positives and negatives per HPI.  Past Medical History  Diagnosis Date  . DUB (dysfunctional uterine bleeding)   . ADD (attention deficit disorder with hyperactivity)   . Eczema   . Depression   . HPV (human papilloma virus) infection     Past Surgical History  Procedure Laterality Date  . Toe surgery      Family History  Problem Relation Age of Onset  . Cancer Other     breast  . ADD / ADHD Other     Social History   Social History  . Marital Status: Single    Spouse Name: N/A  . Number of Children: N/A  . Years of Education: N/A   Social History Main Topics  . Smoking status: Never Smoker   . Smokeless tobacco: Never Used  . Alcohol Use: No  . Drug Use: No  . Sexual Activity: No   Other Topics Concern  . None   Social History Narrative     Current outpatient prescriptions:  .  amoxicillin-clavulanate (AUGMENTIN) 875-125 MG tablet, Take 1 tablet by mouth 2 (two) times daily., Disp: 20 tablet, Rfl: 0  EXAM:  Filed Vitals:   12/31/15 1019  BP: 100/80  Pulse: 90  Temp: 98.1 F (36.7 C)    Body mass index is 35.51 kg/(m^2).  GENERAL: vitals reviewed and listed above, alert, oriented, appears well hydrated and in no acute distress  HEENT: atraumatic, conjunttiva clear, no obvious abnormalities on inspection of external nose and ears, normal appearance of ear canals and TMs, thick nasal congestion, mild post oropharyngeal erythema with PND, no tonsillar edema or exudate, no sinus TTP  NECK: no obvious masses on inspection  LUNGS: clear to auscultation bilaterally,  no wheezes, rales or rhonchi, good air movement  CV: HRRR, no peripheral edema  MS: moves all extremities without noticeable abnormality  PSYCH: pleasant and cooperative, no obvious depression or anxiety  ASSESSMENT AND PLAN:  Discussed the following assessment and plan:  Acute maxillary sinusitis, recurrence not specified  -Augmentin. We discussed treatment side effects, likely course, antibiotic misuse, transmission, and signs of developing a serious illness. -of course, we advised to return or notify a doctor immediately if symptoms worsen or persist or new concerns arise.    Patient Instructions  Take the antibiotic as instructed. Follow up as needed if symptoms worsen or persist.     KIM, HANNAH R.

## 2016-09-19 ENCOUNTER — Ambulatory Visit (INDEPENDENT_AMBULATORY_CARE_PROVIDER_SITE_OTHER): Payer: BLUE CROSS/BLUE SHIELD | Admitting: Family Medicine

## 2016-09-19 ENCOUNTER — Encounter: Payer: Self-pay | Admitting: Family Medicine

## 2016-09-19 VITALS — BP 102/70 | HR 101 | Temp 100.3°F | Ht 66.5 in | Wt 233.2 lb

## 2016-09-19 DIAGNOSIS — J111 Influenza due to unidentified influenza virus with other respiratory manifestations: Secondary | ICD-10-CM

## 2016-09-19 MED ORDER — BENZONATATE 100 MG PO CAPS: 100.0000 mg | ORAL_CAPSULE | Freq: Two times a day (BID) | ORAL | 0 refills | Status: DC | PRN

## 2016-09-19 MED ORDER — OSELTAMIVIR PHOSPHATE 75 MG PO CAPS: 75.0000 mg | ORAL_CAPSULE | Freq: Two times a day (BID) | ORAL | 0 refills | Status: DC

## 2016-09-19 NOTE — Patient Instructions (Signed)

## 2016-09-19 NOTE — Progress Notes (Signed)
HPI:   URI: -started: 1.5 days ago -symptoms:nasal congestion, sore throat, cough, body aches, low grade fever -denies:SOB, NVD, tooth pain -has tried:  nothing -sick contacts/travel/risks: no reported flu, strep or tick exposure  ROS: See pertinent positives and negatives per HPI.  Past Medical History:  Diagnosis Date  . ADD (attention deficit disorder with hyperactivity)   . Depression   . DUB (dysfunctional uterine bleeding)   . Eczema   . HPV (human papilloma virus) infection     Past Surgical History:  Procedure Laterality Date  . TOE SURGERY      Family History  Problem Relation Age of Onset  . Cancer Other     breast  . ADD / ADHD Other     Social History   Social History  . Marital status: Single    Spouse name: N/A  . Number of children: N/A  . Years of education: N/A   Social History Main Topics  . Smoking status: Never Smoker  . Smokeless tobacco: Never Used  . Alcohol use No  . Drug use: No  . Sexual activity: No   Other Topics Concern  . None   Social History Narrative  . None     Current Outpatient Prescriptions:  .  benzonatate (TESSALON) 100 MG capsule, Take 1 capsule (100 mg total) by mouth 2 (two) times daily as needed for cough., Disp: 20 capsule, Rfl: 0 .  oseltamivir (TAMIFLU) 75 MG capsule, Take 1 capsule (75 mg total) by mouth 2 (two) times daily., Disp: 10 capsule, Rfl: 0  EXAM:  Vitals:   09/19/16 1359  BP: 102/70  Pulse: (!) 101  Temp: 100.3 F (37.9 C)    Body mass index is 37.08 kg/m.  GENERAL: vitals reviewed and listed above, alert, oriented, appears well hydrated and in no acute distress  HEENT: atraumatic, conjunttiva clear, no obvious abnormalities on inspection of external nose and ears, normal appearance of ear canals and TMs, clear nasal congestion, mild post oropharyngeal erythema with PND, no tonsillar edema or exudate, no sinus TTP  NECK: no obvious masses on inspection  LUNGS: clear to  auscultation bilaterally, no wheezes, rales or rhonchi, good air movement  CV: HRRR, no peripheral edema  MS: moves all extremities without noticeable abnormality  PSYCH: pleasant and cooperative, no obvious depression or anxiety  ASSESSMENT AND PLAN:  Discussed the following assessment and plan:  Influenza  We discussed potential/likely etiologies, with influenza being likely or possible vs. viral infection or other. We discussed risks/benefits/indications/best timing for tamiflu, likely course, transmission, potential complications, signs of developing a serious illness and return and emergency precuations. She did want Rx for tamiflu. Tessalon for cough.    Patient Instructions  Influenza, Adult Influenza ("the flu") is an infection in the lungs, nose, and throat (respiratory tract). It is caused by a virus. The flu causes many common cold symptoms, as well as a high fever and body aches. It can make you feel very sick. The flu spreads easily from person to person (is contagious). Getting a flu shot (influenza vaccination) every year is the best way to prevent the flu. Follow these instructions at home:  Take over-the-counter and prescription medicines only as told by your doctor.  Use a cool mist humidifier to add moisture (humidity) to the air in your home. This can make it easier to breathe.  Rest as needed.  Drink enough fluid to keep your pee (urine) clear or pale yellow.  Cover your mouth and nose  when you cough or sneeze.  Wash your hands with soap and water often, especially after you cough or sneeze. If you cannot use soap and water, use hand sanitizer.  Stay home from work or school as told by your doctor. Unless you are visiting your doctor, try to avoid leaving home until your fever has been gone for 24 hours without the use of medicine.  Keep all follow-up visits as told by your doctor. This is important. How is this prevented?  Getting a yearly (annual) flu  shot is the best way to avoid getting the flu. You may get the flu shot in late summer, fall, or winter. Ask your doctor when you should get your flu shot.  Wash your hands often or use hand sanitizer often.  Avoid contact with people who are sick during cold and flu season.  Eat healthy foods.  Drink plenty of fluids.  Get enough sleep.  Exercise regularly. Contact a doctor if:  You get new symptoms.  You have:  Chest pain.  Watery poop (diarrhea).  A fever.  Your cough gets worse.  You start to have more mucus.  You feel sick to your stomach (nauseous).  You throw up (vomit). Get help right away if:  You start to be short of breath or have trouble breathing.  Your skin or nails turn a bluish color.  You have very bad pain or stiffness in your neck.  You get a sudden headache.  You get sudden pain in your face or ear.  You cannot stop throwing up. This information is not intended to replace advice given to you by your health care provider. Make sure you discuss any questions you have with your health care provider. Document Released: 04/25/2008 Document Revised: 12/23/2015 Document Reviewed: 05/11/2015 Elsevier Interactive Patient Education  [address]    [place], Dahlia Client R., DO

## 2016-09-19 NOTE — Progress Notes (Signed)
Pre visit review using our clinic review tool, if applicable. No additional management support is needed unless otherwise documented below in the visit note. 

## 2016-09-20 ENCOUNTER — Telehealth: Payer: Self-pay | Admitting: Family Medicine

## 2016-09-20 NOTE — Telephone Encounter (Signed)
Pt calling stating that her cough has gotten worse from the time in which she was in the office and is not getting any relief.  And the cough medication is not working and would like to know what the next would be.   Pharm:  [facility] [address].

## 2016-09-21 ENCOUNTER — Ambulatory Visit (INDEPENDENT_AMBULATORY_CARE_PROVIDER_SITE_OTHER): Payer: BLUE CROSS/BLUE SHIELD | Admitting: Family Medicine

## 2016-09-21 ENCOUNTER — Telehealth: Payer: Self-pay | Admitting: Family Medicine

## 2016-09-21 ENCOUNTER — Encounter: Payer: Self-pay | Admitting: Family Medicine

## 2016-09-21 VITALS — BP 100/60 | HR 87 | Temp 98.5°F | Ht 66.5 in | Wt 226.7 lb

## 2016-09-21 DIAGNOSIS — R05 Cough: Secondary | ICD-10-CM

## 2016-09-21 DIAGNOSIS — J111 Influenza due to unidentified influenza virus with other respiratory manifestations: Secondary | ICD-10-CM | POA: Diagnosis not present

## 2016-09-21 DIAGNOSIS — R059 Cough, unspecified: Secondary | ICD-10-CM

## 2016-09-21 MED ORDER — HYDROCODONE-HOMATROPINE 5-1.5 MG/5ML PO SYRP: 5.0000 mL | ORAL_SOLUTION | Freq: Three times a day (TID) | ORAL | 0 refills | Status: DC | PRN

## 2016-09-21 NOTE — Patient Instructions (Signed)
Get chest xray  Can try the cough syrup instead for cough  Seek care if worsening, new concerns or not improving as expected  You have decided to try a medication that is a controlled substance for treatment of your medical problem. While this medication has benefits and can help you with your illness, as we discussed, it also has considerable risks. You have been properly informed of the risks and alternatives and re-iterate here that this medication can cause:  Altered Mental Capacity or Alertness: Do not drive or operate machinery while taking this medication  Dependence: A physiological state that can occur with regular drug use and results in withdrawal symptoms when drug use is abruptly discontinued. This can happen even with a short course of this medication.  Addiction: A chronic, relapsing disease characterized by compulsive drug-seeking and use despite negative consequences and by long-lasting changes in the brain.  Many, many more side effects including some that can be serious and/or life threatening. Please discuss these side effects with your pharmacist.  I advise: -that you use as little of this medication as possible and only as directed for as short of time as possible -keep this medication in a safe and locked location away from children or others -do not share this medication -dispose of any unused medication in a medication drop box -do not take this medication with other controlled or sedating substances, alcohol or drugs other then those approved by your doctor

## 2016-09-21 NOTE — Progress Notes (Signed)
Pre visit review using our clinic review tool, if applicable. No additional management support is needed unless otherwise documented below in the visit note. 

## 2016-09-21 NOTE — Telephone Encounter (Signed)
I called the pt and she describes symptoms which sound like shortness of breath and wheezing, temp not taken and I offered her an appt today at 4:45 with Dr Selena BattenKim.  Patient stated she will check on transportation and call back for an appt.

## 2016-09-21 NOTE — Telephone Encounter (Signed)
Patient Name: Ananias PilgrimCARLIE SUGGS DOB: 10-05-1990 Initial Comment Pt was dx w/ flu on Tuesday. She was given cough medicine that isn't helping. She isn't able to sleep due to coughing and now her throat is raw. Nurse Assessment Nurse: Helane RimaKareken, RN, Yehuda MaoJeanine Date/Time (Eastern Time): 09/21/2016 10:07:25 AM Confirm and document reason for call. If symptomatic, describe symptoms. ---Caller was diagnosed with the flu on Tuesday. She was given cough medicine, Benzonatate, that isn't helping. She isn't able to sleep due to coughing and now her throat is raw. Does the patient have any new or worsening symptoms? ---Yes Will a triage be completed? ---Yes Related visit to physician within the last 2 weeks? ---Yes Does the PT have any chronic conditions? (i.e. diabetes, asthma, etc.) ---No Is the patient pregnant or possibly pregnant? (Ask all females between the ages of 312-55) ---No Is this a behavioral health or substance abuse call? ---No Guidelines Guideline Title Affirmed Question Affirmed Notes Influenza Follow-up Call ALSO, mild central chest pain occurs only when coughing (all triage questions negative) Final Disposition User Home Care Helane RimaKareken, RN, Yehuda MaoJeanine Disagree/Comply: Comply

## 2016-09-21 NOTE — Telephone Encounter (Signed)
I left a message for the pt to return my call. 

## 2016-09-21 NOTE — Telephone Encounter (Signed)
If worsening, SOB, wheezing, weakness, fevers needs re-evaluation/possible CXR.  If feeling ok, just a cough, can try a cough syrup - hycodan, 5mL bid prn, 60mL, no refills. Advise discussion with pharmacist of risks.

## 2016-09-21 NOTE — Progress Notes (Signed)
   HPI:  Influenza/cough: -started:3-4 days ago -symptoms: seen 2/20 (see notes) started on tamiflu, fevers and NV resolved, still with cough and had trouble sleeping from cough last night, tessalon did not help - felt rattle in chest a few times -denies:fever, SOB, NVD, tooth pain, sinus pain, weakness -has mirena IUD and denies chance of pregnancy  ROS: See pertinent positives and negatives per HPI.  Past Medical History:  Diagnosis Date  . ADD (attention deficit disorder with hyperactivity)   . Depression   . DUB (dysfunctional uterine bleeding)   . Eczema   . HPV (human papilloma virus) infection     Past Surgical History:  Procedure Laterality Date  . TOE SURGERY      Family History  Problem Relation Age of Onset  . Cancer Other     breast  . ADD / ADHD Other     Social History   Social History  . Marital status: Single    Spouse name: N/A  . Number of children: N/A  . Years of education: N/A   Social History Main Topics  . Smoking status: Never Smoker  . Smokeless tobacco: Never Used  . Alcohol use No  . Drug use: No  . Sexual activity: No   Other Topics Concern  . None   Social History Narrative  . None     Current Outpatient Prescriptions:  .  benzonatate (TESSALON) 100 MG capsule, Take 1 capsule (100 mg total) by mouth 2 (two) times daily as needed for cough., Disp: 20 capsule, Rfl: 0 .  oseltamivir (TAMIFLU) 75 MG capsule, Take 1 capsule (75 mg total) by mouth 2 (two) times daily., Disp: 10 capsule, Rfl: 0 .  HYDROcodone-homatropine (HYCODAN) 5-1.5 MG/5ML syrup, Take 5 mLs by mouth every 8 (eight) hours as needed for cough., Disp: 60 mL, Rfl: 0  EXAM:  Vitals:   09/21/16 1703  BP: 100/60  Pulse: 87  Temp: 98.5 F (36.9 C)    Body mass index is 36.04 kg/m.  GENERAL: vitals reviewed and listed above, alert, oriented, appears well hydrated and in no acute distress  HEENT: atraumatic, conjunttiva clear, no obvious abnormalities on  inspection of external nose and ears, normal appearance of ear canals and TMs, clear nasal congestion, mild post oropharyngeal erythema with PND, no tonsillar edema or exudate, no sinus TTP  NECK: no obvious masses on inspection  LUNGS: clear to auscultation bilaterally, no wheezes, rales or rhonchi, good air movement, ? Decreased breath sound r base  CV: HRRR, no peripheral edema  MS: moves all extremities without noticeable abnormality  PSYCH: pleasant and cooperative, no obvious depression or anxiety  ASSESSMENT AND PLAN:  Discussed the following assessment and plan:  Influenza - Plan: DG Chest 2 View  Cough - Plan: DG Chest 2 View  -chest xray - tx accordingly -continue tamiflu -try hycodan for cough, risks discussed -return precuations    There are no Patient Instructions on file for this visit.  Kriste BasqueKIM, Trace Cederberg R., DO

## 2016-09-21 NOTE — Telephone Encounter (Signed)
[name] pt returned your call °

## 2016-09-22 ENCOUNTER — Encounter: Payer: Self-pay | Admitting: Radiology

## 2016-09-22 ENCOUNTER — Other Ambulatory Visit: Payer: Self-pay | Admitting: Family Medicine

## 2016-09-22 ENCOUNTER — Ambulatory Visit (INDEPENDENT_AMBULATORY_CARE_PROVIDER_SITE_OTHER)
Admission: RE | Admit: 2016-09-22 | Discharge: 2016-09-22 | Disposition: A | Discharge status: Home / Self Care | Payer: BLUE CROSS/BLUE SHIELD | Source: Ambulatory Visit | Attending: Family Medicine | Admitting: Family Medicine

## 2016-09-22 DIAGNOSIS — R05 Cough: Secondary | ICD-10-CM

## 2016-09-22 DIAGNOSIS — R0602 Shortness of breath: Secondary | ICD-10-CM | POA: Diagnosis not present

## 2016-09-22 DIAGNOSIS — R059 Cough, unspecified: Secondary | ICD-10-CM

## 2016-09-22 DIAGNOSIS — J111 Influenza due to unidentified influenza virus with other respiratory manifestations: Secondary | ICD-10-CM

## 2016-09-22 MED ORDER — ONDANSETRON HCL 4 MG PO TABS: 4.0000 mg | ORAL_TABLET | Freq: Three times a day (TID) | ORAL | 0 refills | Status: DC | PRN

## 2016-09-22 MED ORDER — DOXYCYCLINE HYCLATE 100 MG PO TABS: 100.0000 mg | ORAL_TABLET | Freq: Two times a day (BID) | ORAL | 0 refills | Status: DC

## 2016-09-22 NOTE — Telephone Encounter (Signed)
LMTCB

## 2016-09-22 NOTE — Telephone Encounter (Signed)
Spoke with pt and she was seen yesterday by Dr Selena BattenKim.

## 2016-09-26 NOTE — Telephone Encounter (Signed)
I called the pt and she stated she is feeling better and will call back if needed.  I also advised her this is contagious and she should cover her cough,wash her hands,etc and she agreed.

## 2016-09-26 NOTE — Telephone Encounter (Signed)
Pt states she is still not feeling better and SOB at times.  Would like Dr Selena BattenKim advice on what she thinks.  Pt feels like she should be better after the abx. Please call back.

## 2016-09-26 NOTE — Telephone Encounter (Signed)
Please call pt - see if feeling any better/worse/symptoms? It can take some time to feel 100%, but she should be feeling significantly better by now. If not feeling much better may need to have her come in to listen again, check vitals, discuss next step. Thanks.

## 2016-10-26 DIAGNOSIS — F902 Attention-deficit hyperactivity disorder, combined type: Secondary | ICD-10-CM | POA: Diagnosis not present

## 2016-11-06 ENCOUNTER — Ambulatory Visit (INDEPENDENT_AMBULATORY_CARE_PROVIDER_SITE_OTHER): Payer: BLUE CROSS/BLUE SHIELD | Admitting: Family Medicine

## 2016-11-06 ENCOUNTER — Encounter: Payer: Self-pay | Admitting: Family Medicine

## 2016-11-06 VITALS — BP 100/60 | HR 95 | Temp 98.2°F | Ht 66.5 in | Wt 231.3 lb

## 2016-11-06 DIAGNOSIS — J069 Acute upper respiratory infection, unspecified: Secondary | ICD-10-CM

## 2016-11-06 DIAGNOSIS — J029 Acute pharyngitis, unspecified: Secondary | ICD-10-CM

## 2016-11-06 LAB — POC INFLUENZA A&B (BINAX/QUICKVUE)
INFLUENZA A, POC: NEGATIVE
Influenza B, POC: NEGATIVE

## 2016-11-06 LAB — POCT RAPID STREP A (OFFICE): RAPID STREP A SCREEN: POSITIVE — AB

## 2016-11-06 MED ORDER — AMOXICILLIN 500 MG PO CAPS: 500.0000 mg | ORAL_CAPSULE | Freq: Two times a day (BID) | ORAL | 0 refills | Status: DC

## 2016-11-06 NOTE — Progress Notes (Signed)
Pre visit review using our clinic review tool, if applicable. No additional management support is needed unless otherwise documented below in the visit note. 

## 2016-11-06 NOTE — Progress Notes (Signed)
   HPI:  Acute visit for URI: -started: yesterday -symptoms:nasal congestion, very sore throat, cough, body aches, sub fever - low grade time -denies:fever > 100.4, SOB, NVD, tooth pain, sinus pain -has tried: nothing -sick contacts/travel/risks: no reported flu, strep or tick exposure -wants strep and flu test and does not want to take tamiflu unless positive  ROS: See pertinent positives and negatives per HPI.  Past Medical History:  Diagnosis Date  . ADD (attention deficit disorder with hyperactivity)   . Depression   . DUB (dysfunctional uterine bleeding)   . Eczema   . HPV (human papilloma virus) infection     Past Surgical History:  Procedure Laterality Date  . TOE SURGERY      Family History  Problem Relation Age of Onset  . Cancer Other     breast  . ADD / ADHD Other     Social History   Social History  . Marital status: Single    Spouse name: N/A  . Number of children: N/A  . Years of education: N/A   Social History Main Topics  . Smoking status: Never Smoker  . Smokeless tobacco: Never Used  . Alcohol use No  . Drug use: No  . Sexual activity: No   Other Topics Concern  . None   Social History Narrative  . None     Current Outpatient Prescriptions:  .  amoxicillin (AMOXIL) 500 MG capsule, Take 1 capsule (500 mg total) by mouth 2 (two) times daily., Disp: 20 capsule, Rfl: 0  EXAM:  Vitals:   11/06/16 1509  BP: 100/60  Pulse: 95  Temp: 98.2 F (36.8 C)    Body mass index is 36.77 kg/m.  GENERAL: vitals reviewed and listed above, alert, oriented, appears well hydrated and in no acute distress  HEENT: atraumatic, conjunttiva clear, no obvious abnormalities on inspection of external nose and ears, normal appearance of ear canals and TMs, clear nasal congestion, mild post oropharyngeal erythema with PND, 1+ tonsillar tonsillar edema, no sinus TTP  NECK: no obvious masses on inspection  LUNGS: clear to auscultation bilaterally, no  wheezes, rales or rhonchi, good air movement  CV: HRRR, no peripheral edema  MS: moves all extremities without noticeable abnormality  PSYCH: pleasant and cooperative, no obvious depression or anxiety  ASSESSMENT AND PLAN:  Discussed the following assessment and plan:  Upper respiratory tract infection, unspecified type - Plan: POC Influenza A&B(BINAX/QUICKVUE)  Sore throat - Plan: POC Rapid Strep A  -strep test + -discussed tx/risks, transmission, return precuations and amox sent to pharmacy -of course, we advised to return or notify a doctor immediately if symptoms worsen or persist or new concerns arise. -declined avs   There are no Patient Instructions on file for this visit.  Kriste Basque R., DO

## 2016-11-14 ENCOUNTER — Other Ambulatory Visit: Payer: Self-pay | Admitting: Obstetrics and Gynecology

## 2016-11-14 DIAGNOSIS — Z1329 Encounter for screening for other suspected endocrine disorder: Secondary | ICD-10-CM | POA: Diagnosis not present

## 2016-11-14 DIAGNOSIS — Z6835 Body mass index (BMI) 35.0-35.9, adult: Secondary | ICD-10-CM | POA: Diagnosis not present

## 2016-11-14 DIAGNOSIS — Z124 Encounter for screening for malignant neoplasm of cervix: Secondary | ICD-10-CM | POA: Diagnosis not present

## 2016-11-14 DIAGNOSIS — Z01419 Encounter for gynecological examination (general) (routine) without abnormal findings: Secondary | ICD-10-CM | POA: Diagnosis not present

## 2016-11-14 DIAGNOSIS — Z130 Encounter for screening for diseases of the blood and blood-forming organs and certain disorders involving the immune mechanism: Secondary | ICD-10-CM | POA: Diagnosis not present

## 2016-11-14 DIAGNOSIS — Z13220 Encounter for screening for lipoid disorders: Secondary | ICD-10-CM | POA: Diagnosis not present

## 2016-11-16 LAB — CYTOLOGY - PAP

## 2016-11-17 DIAGNOSIS — F902 Attention-deficit hyperactivity disorder, combined type: Secondary | ICD-10-CM | POA: Diagnosis not present

## 2016-11-21 ENCOUNTER — Telehealth: Payer: Self-pay | Admitting: Family Medicine

## 2016-11-21 ENCOUNTER — Encounter: Payer: Self-pay | Admitting: Family Medicine

## 2016-11-21 ENCOUNTER — Ambulatory Visit (INDEPENDENT_AMBULATORY_CARE_PROVIDER_SITE_OTHER): Payer: BLUE CROSS/BLUE SHIELD | Admitting: Family Medicine

## 2016-11-21 VITALS — BP 100/60 | HR 89 | Temp 99.3°F | Ht 66.5 in | Wt 230.1 lb

## 2016-11-21 DIAGNOSIS — J069 Acute upper respiratory infection, unspecified: Secondary | ICD-10-CM | POA: Diagnosis not present

## 2016-11-21 MED ORDER — HYDROCODONE-HOMATROPINE 5-1.5 MG/5ML PO SYRP: 5.0000 mL | ORAL_SOLUTION | Freq: Three times a day (TID) | ORAL | 0 refills | Status: DC | PRN

## 2016-11-21 NOTE — Patient Instructions (Signed)

## 2016-11-21 NOTE — Telephone Encounter (Signed)
Patient was seen today by Dr.Kim.

## 2016-11-21 NOTE — Progress Notes (Signed)
HPI:  cough and congestion -started: today -symptoms:nasal congestion, sneezing, cough -denies:fever, SOB, NVD, tooth pain, sore throat -has tried:nothing  -sick contacts/travel/risks: no reported flu, strep or tick exposure - but hung out with neices/nephews over the weekend and they were sick and diagnosed with resp illness -Hx of: allergies -treated for strep throat a few weeks ago and reports she has been doing great since  ROS: See pertinent positives and negatives per HPI.  Past Medical History:  Diagnosis Date  . ADD (attention deficit disorder with hyperactivity)   . Depression   . DUB (dysfunctional uterine bleeding)   . Eczema   . HPV (human papilloma virus) infection     Past Surgical History:  Procedure Laterality Date  . TOE SURGERY      Family History  Problem Relation Age of Onset  . Cancer Other     breast  . ADD / ADHD Other     Social History   Social History  . Marital status: Single    Spouse name: N/A  . Number of children: N/A  . Years of education: N/A   Social History Main Topics  . Smoking status: Never Smoker  . Smokeless tobacco: Never Used  . Alcohol use No  . Drug use: No  . Sexual activity: No   Other Topics Concern  . None   Social History Narrative  . None     Current Outpatient Prescriptions:  .  HYDROcodone-homatropine (HYCODAN) 5-1.5 MG/5ML syrup, Take 5 mLs by mouth every 8 (eight) hours as needed for cough., Disp: 60 mL, Rfl: 0  EXAM:  Vitals:   11/21/16 1306  BP: 100/60  Pulse: 89  Temp: 99.3 F (37.4 C)    Body mass index is 36.58 kg/m.  GENERAL: vitals reviewed and listed above, alert, oriented, appears well hydrated and in no acute distress  HEENT: atraumatic, conjunttiva clear, no obvious abnormalities on inspection of external nose and ears, normal appearance of ear canals and TMs, clear nasal congestion, mild post oropharyngeal erythema with PND, no tonsillar edema or exudate, no sinus  TTP  NECK: no obvious masses on inspection  LUNGS: clear to auscultation bilaterally, no wheezes, rales or rhonchi, good air movement  CV: HRRR, no peripheral edema  MS: moves all extremities without noticeable abnormality  PSYCH: pleasant and cooperative, no obvious depression or anxiety  ASSESSMENT AND PLAN:  Discussed the following assessment and plan:  Viral upper respiratory illness  -given HPI and exam findings today, a serious infection or illness is unlikely. We discussed potential etiologies, with VURI being most likely, and advised supportive care and monitoring. We discussed treatment side effects, likely course, antibiotic misuse, transmission, and signs of developing a serious illness. She requests hycodan cough syrup as this is the only thing that works for the cough when sick. Risks discussed. Advised not to use with any other sedating medications or alcohol. Only small amount provided. -of course, we advised to return or notify a doctor immediately if symptoms worsen or persist or new concerns arise.    Patient Instructions  INSTRUCTIONS FOR UPPER RESPIRATORY INFECTION:  -plenty of rest and fluids  -nasal saline wash 2-3 times daily (use prepackaged nasal saline or bottled/distilled water if making your own)   -can use AFRIN nasal spray for drainage and nasal congestion - but do NOT use longer then 3-4 days  -can use tylenol (in no history of liver disease) or ibuprofen (if no history of kidney disease, bowel bleeding or significant heart  disease) as directed for aches and sorethroat  -in the winter time, using a humidifier at night is helpful (please follow cleaning instructions)  -if you are taking a cough medication - use only as directed, may also try a teaspoon of honey to coat the throat and throat lozenges. If given a cough medication with codeine or hydrocodone or other narcotic please be advised that this contains a strong and  potentially addicting  medication. Please follow instructions carefully, take as little as possible and only use AS NEEDED for severe cough. Discuss potential side effects with your pharmacy. Please do not drive or operate machinery while taking these types of medications. Please do not take other sedating medications, drugs or alcohol while taking this medication without discussing with your doctor.  -for sore throat, salt water gargles can help  -follow up if you have fevers, facial pain, tooth pain, difficulty breathing or are worsening or symptoms persist longer then expected  Upper Respiratory Infection, Adult An upper respiratory infection (URI) is also known as the common cold. It is often caused by a type of germ (virus). Colds are easily spread (contagious). You can pass it to others by kissing, coughing, sneezing, or drinking out of the same glass. Usually, you get better in 1 to 3  weeks.  However, the cough can last for even longer. HOME CARE   Only take medicine as told by your doctor. Follow instructions provided above.  Drink enough water and fluids to keep your pee (urine) clear or pale yellow.  Get plenty of rest.  Return to work when your temperature is < 100 for 24 hours or as told by your doctor. You may use a face mask and wash your hands to stop your cold from spreading. GET HELP RIGHT AWAY IF:   After the first few days, you feel you are getting worse.  You have questions about your medicine.  You have chills, shortness of breath, or red spit (mucus).  You have pain in the face for more then 1-2 days, especially when you bend forward.  You have a fever, puffy (swollen) neck, pain when you swallow, or white spots in the back of your throat.  You have a bad headache, ear pain, sinus pain, or chest pain.  You have a high-pitched whistling sound when you breathe in and out (wheezing).  You cough up blood.  You have sore muscles or a stiff neck. MAKE SURE YOU:   Understand these  instructions.  Will watch your condition.  Will get help right away if you are not doing well or get worse. Document Released: 01/03/2008 Document Revised: 10/09/2011 Document Reviewed: 10/22/2013 [facility] Patient Information 2015 [place], [place]. This information is not intended to replace advice given to you by your health care provider. Make sure you discuss any questions you have with your health care provider.    Kriste Basque R., DO

## 2016-11-21 NOTE — Telephone Encounter (Signed)
[name] pt would like to speak with you on a personal matter. °

## 2016-11-21 NOTE — Progress Notes (Signed)
Pre visit review using our clinic review tool, if applicable. No additional management support is needed unless otherwise documented below in the visit note. 

## 2016-11-30 DIAGNOSIS — J309 Allergic rhinitis, unspecified: Secondary | ICD-10-CM | POA: Diagnosis not present

## 2016-12-12 NOTE — Progress Notes (Signed)
HPI:  Here for CPE: Due for HIV screening, tdap, labs  -Concerns and/or follow up today: none  -Diet: variety of foods, balance and well rounded, larger portion sizes  -Exercise: no regular exercise  -Taking folic acid, vitamin D or calcium: no  -Diabetes and Dyslipidemia Screening: not fasting  -Hx of HTN: no  -Vaccines: UTD except for tetanus booster which she wants to get today  -pap history: Sees Waynard ReedsKendra Ross, gyn - last CPE 10/2016  -FDLMP: no periods, on mirena  -sexual activity: yes, female partner, no new partners  -wants STI testing (Hep C if born [birth year]): no, declined HIV and STI screening  -FH breast, colon or ovarian ca: see FH Last mammogram: n/a Last colon cancer screening: n/a  -Alcohol, Tobacco, drug use: see social history  Review of Systems - no fevers, unintentional weight loss, vision loss, hearing loss, chest pain, sob, hemoptysis, melena, hematochezia, hematuria, genital discharge, changing or concerning skin lesions, bleeding, bruising, loc, thoughts of self harm or SI  Past Medical History:  Diagnosis Date  . ADD (attention deficit disorder with hyperactivity)   . Depression   . DUB (dysfunctional uterine bleeding)   . Eczema   . HPV (human papilloma virus) infection     Past Surgical History:  Procedure Laterality Date  . TOE SURGERY      Family History  Problem Relation Age of Onset  . Cancer Other        breast  . ADD / ADHD Other     Social History   Social History  . Marital status: Single    Spouse name: N/A  . Number of children: N/A  . Years of education: N/A   Social History Main Topics  . Smoking status: Never Smoker  . Smokeless tobacco: Never Used  . Alcohol use No  . Drug use: No  . Sexual activity: No   Other Topics Concern  . None   Social History Narrative  . None     Current Outpatient Prescriptions:  .  levonorgestrel (MIRENA) 20 MCG/24HR IUD, 1 each by Intrauterine route once., Disp: , Rfl:    EXAM:  Vitals:   12/14/16 0801  BP: 102/76  Pulse: 78  Temp: 98 F (36.7 C)  Body mass index is 36.64 kg/m.   GENERAL: vitals reviewed and listed below, alert, oriented, appears well hydrated and in no acute distress  HEENT: head atraumatic, PERRLA, normal appearance of eyes, ears, nose and mouth. moist mucus membranes.  NECK: supple, no masses or lymphadenopathy  LUNGS: clear to auscultation bilaterally, no rales, rhonchi or wheeze  CV: HRRR, no peripheral edema or cyanosis, normal pedal pulses  ABDOMEN: bowel sounds normal, soft, non tender to palpation, no masses, no rebound or guarding  SKIN: no rash or abnormal lesions, declined for skin exam  GU/BREAST: declined, does with gyn  MS: normal gait, moves all extremities normally  NEURO: normal gait, speech and thought processing grossly intact, muscle tone grossly intact throughout  PSYCH: normal affect, pleasant and cooperative  ASSESSMENT AND PLAN:  Discussed the following assessment and plan:  Encounter for preventive care - Plan: HDL cholesterol, Cholesterol, total, Hemoglobin A1c  BMI 36.0-36.9,adult   -Discussed and advised all [place] preventive services health task force level A and B recommendations for age, sex and risks.  -Advised at least 150 minutes of exercise per week and a healthy diet with avoidance of (less then 1 serving per week) processed foods, white starches, red meat, fast foods and sweets and  consisting of: * 5-9 servings of fresh fruits and vegetables (not corn or potatoes) *nuts and seeds, beans *olives and olive oil *lean meats such as fish and white chicken  *whole grains  -labs, studies and vaccines per orders this encounter  Orders Placed This Encounter  Procedures  . HDL cholesterol  . Cholesterol, total  . Hemoglobin A1c    Patient advised to return to clinic immediately if symptoms worsen or persist or new concerns.  Patient Instructions  BEFORE YOU LEAVE: -tetanus  booster -labs -follow up: yearly for physical and as needed  We have ordered labs or studies at this visit. It can take up to 1-2 weeks for results and processing. IF results require follow up or explanation, we will call you with instructions. Clinically stable results will be released to your [facility]. If you have not heard from [place] or cannot find your results in [facility] in 2 weeks please contact our office at [phone].  If you are not yet signed up for [facility], please consider signing up.   We recommend the following healthy lifestyle for LIFE: 1) Small portions.   Tip: eat off of a salad plate instead of a dinner plate.  Tip: if you need more or a snack choose fruits, veggies and/or a handful of nuts or seeds.  2) Eat a healthy clean diet.  * Tip: Avoid (less then 1 serving per week): processed foods, sweets, sweetened drinks, white starches (rice, flour, bread, potatoes, pasta, etc), red meat, fast foods, butter  *Tip: CHOOSE instead   * 5-9 servings per day of fresh or frozen fruits and vegetables (but not corn, potatoes, bananas, canned or dried fruit)   *nuts and seeds, beans   *olives and olive oil   *small portions of lean meats such as fish and white chicken    *small portions of whole grains  3)Get at least 150 minutes of sweaty aerobic exercise per week.  4)Reduce stress - consider counseling, meditation and relaxation to balance other aspects of your life.          No Follow-up on file.  Kriste Basque R., DO

## 2016-12-14 ENCOUNTER — Ambulatory Visit (INDEPENDENT_AMBULATORY_CARE_PROVIDER_SITE_OTHER): Payer: BLUE CROSS/BLUE SHIELD | Admitting: Family Medicine

## 2016-12-14 ENCOUNTER — Encounter: Payer: Self-pay | Admitting: Family Medicine

## 2016-12-14 VITALS — BP 102/76 | HR 78 | Temp 98.0°F | Ht 66.75 in | Wt 232.2 lb

## 2016-12-14 DIAGNOSIS — Z0000 Encounter for general adult medical examination without abnormal findings: Secondary | ICD-10-CM | POA: Diagnosis not present

## 2016-12-14 DIAGNOSIS — Z6836 Body mass index (BMI) 36.0-36.9, adult: Secondary | ICD-10-CM | POA: Diagnosis not present

## 2016-12-14 DIAGNOSIS — Z23 Encounter for immunization: Secondary | ICD-10-CM

## 2016-12-14 LAB — HDL CHOLESTEROL: HDL: 51.7 mg/dL (ref >39.00)

## 2016-12-14 LAB — CHOLESTEROL, TOTAL: Cholesterol: 145 mg/dL (ref 0–200)

## 2016-12-14 LAB — HEMOGLOBIN A1C: HEMOGLOBIN A1C: 5.1 % (ref 4.6–6.5)

## 2016-12-14 NOTE — Patient Instructions (Signed)
BEFORE YOU LEAVE: -tetanus booster -labs -follow up: yearly for physical and as needed  We have ordered labs or studies at this visit. It can take up to 1-2 weeks for results and processing. IF results require follow up or explanation, we will call you with instructions. Clinically stable results will be released to your [facility]. If you have not heard from us or cannot find your results in [facility] in 2 weeks please contact our office at [phone].  If you are not yet signed up for [facility], please consider signing up.   We recommend the following healthy lifestyle for LIFE: 1) Small portions.   Tip: eat off of a salad plate instead of a dinner plate.  Tip: if you need more or a snack choose fruits, veggies and/or a handful of nuts or seeds.  2) Eat a healthy clean diet.  * Tip: Avoid (less then 1 serving per week): processed foods, sweets, sweetened drinks, white starches (rice, flour, bread, potatoes, pasta, etc), red meat, fast foods, butter  *Tip: CHOOSE instead   * 5-9 servings per day of fresh or frozen fruits and vegetables (but not corn, potatoes, bananas, canned or dried fruit)   *nuts and seeds, beans   *olives and olive oil   *small portions of lean meats such as fish and white chicken    *small portions of whole grains  3)Get at least 150 minutes of sweaty aerobic exercise per week.  4)Reduce stress - consider counseling, meditation and relaxation to balance other aspects of your life.

## 2016-12-20 DIAGNOSIS — F902 Attention-deficit hyperactivity disorder, combined type: Secondary | ICD-10-CM | POA: Diagnosis not present

## 2016-12-20 DIAGNOSIS — F430 Acute stress reaction: Secondary | ICD-10-CM | POA: Diagnosis not present

## 2017-01-03 DIAGNOSIS — F430 Acute stress reaction: Secondary | ICD-10-CM | POA: Diagnosis not present

## 2017-01-03 DIAGNOSIS — F902 Attention-deficit hyperactivity disorder, combined type: Secondary | ICD-10-CM | POA: Diagnosis not present

## 2017-02-07 DIAGNOSIS — F902 Attention-deficit hyperactivity disorder, combined type: Secondary | ICD-10-CM | POA: Diagnosis not present

## 2017-02-13 DIAGNOSIS — F902 Attention-deficit hyperactivity disorder, combined type: Secondary | ICD-10-CM | POA: Diagnosis not present

## 2017-04-09 DIAGNOSIS — F902 Attention-deficit hyperactivity disorder, combined type: Secondary | ICD-10-CM | POA: Diagnosis not present

## 2017-05-07 DIAGNOSIS — F902 Attention-deficit hyperactivity disorder, combined type: Secondary | ICD-10-CM | POA: Diagnosis not present

## 2017-05-08 DIAGNOSIS — F902 Attention-deficit hyperactivity disorder, combined type: Secondary | ICD-10-CM | POA: Diagnosis not present

## 2017-05-22 ENCOUNTER — Encounter: Payer: Self-pay | Admitting: Family Medicine

## 2017-05-22 ENCOUNTER — Ambulatory Visit (INDEPENDENT_AMBULATORY_CARE_PROVIDER_SITE_OTHER): Payer: BLUE CROSS/BLUE SHIELD | Admitting: Family Medicine

## 2017-05-22 VITALS — BP 100/70 | HR 85 | Temp 98.5°F | Ht 66.75 in | Wt 223.2 lb

## 2017-05-22 DIAGNOSIS — J069 Acute upper respiratory infection, unspecified: Secondary | ICD-10-CM

## 2017-05-22 LAB — POCT RAPID STREP A (OFFICE): Rapid Strep A Screen: NEGATIVE

## 2017-05-22 MED ORDER — BENZONATATE 100 MG PO CAPS: 100.0000 mg | ORAL_CAPSULE | Freq: Three times a day (TID) | ORAL | 0 refills | Status: DC | PRN

## 2017-05-22 MED ORDER — ALBUTEROL SULFATE HFA 108 (90 BASE) MCG/ACT IN AERS: 2.0000 | INHALATION_SPRAY | Freq: Four times a day (QID) | RESPIRATORY_TRACT | 0 refills | Status: DC | PRN

## 2017-05-22 NOTE — Progress Notes (Signed)
HPI:  Sore throat.congestion -started: 3-4 days ago -symptoms:nasal congestion, sore throat, nausea, cough, feels a little wheezy and tight -denies:fever, SOB, NVD, tooth pain, body aches -has tried: nothing -sick contacts/travel/risks: no reported flu, strep or tick exposure -Hx WU:[id number]:pneumonia, strep throat ROS: See pertinent positives and negatives per HPI.  Past Medical History:  Diagnosis Date  . ADD (attention deficit disorder with hyperactivity)   . ADHD   . Anxiety    per patient-treated by [facility].  . Depression   . DUB (dysfunctional uterine bleeding)   . Eczema   . HPV (human papilloma virus) infection     Past Surgical History:  Procedure Laterality Date  . TOE SURGERY      Family History  Problem Relation Age of Onset  . Cancer Other        breast  . ADD / ADHD Other     Social History   Social History  . Marital status: Single    Spouse name: N/A  . Number of children: N/A  . Years of education: N/A   Social History Main Topics  . Smoking status: Never Smoker  . Smokeless tobacco: Never Used  . Alcohol use No  . Drug use: No  . Sexual activity: No   Other Topics Concern  . None   Social History Narrative  . None     Current Outpatient Prescriptions:  .  ADDERALL XR 20 MG 24 hr capsule, , Disp: , Rfl: 0 .  atomoxetine (STRATTERA) 25 MG capsule, , Disp: , Rfl: 0 .  clonazePAM (KLONOPIN) 0.5 MG tablet, , Disp: , Rfl: 1 .  levonorgestrel (MIRENA) 20 MCG/24HR IUD, 1 each by Intrauterine route once., Disp: , Rfl:  .  albuterol (PROVENTIL HFA;VENTOLIN HFA) 108 (90 Base) MCG/ACT inhaler, Inhale 2 puffs into the lungs every 6 (six) hours as needed., Disp: 1 Inhaler, Rfl: 0 .  benzonatate (TESSALON PERLES) 100 MG capsule, Take 1 capsule (100 mg total) by mouth 3 (three) times daily as needed., Disp: 20 capsule, Rfl: 0  EXAM:  Vitals:   05/22/17 1618  BP: 100/70  Pulse: 85  Temp: 98.5 F (36.9 C)  SpO2: 97%    Body mass index is  35.22 kg/m.  GENERAL: vitals reviewed and listed above, alert, oriented, appears well hydrated and in no acute distress  HEENT: atraumatic, conjunttiva clear, no obvious abnormalities on inspection of external nose and ears, normal appearance of ear canals and TMs, clear nasal congestion, mild post oropharyngeal erythema with PND, no tonsillar edema or exudate, no sinus TTP  NECK: no obvious masses on inspection  LUNGS: clear to auscultation bilaterally, no wheezes, rales or rhonchi, good air movement  CV: HRRR, no peripheral edema  MS: moves all extremities without noticeable abnormality  PSYCH: pleasant and cooperative, no obvious depression or anxiety  ASSESSMENT AND PLAN:  Discussed the following assessment and plan:  Viral upper respiratory tract infection - Plan: DG Chest 2 View, POC Rapid Strep A  -given HPI and exam findings today, a serious infection or illness is unlikely. Strep test negative. CXR pending. We discussed potential etiologies, with VURI with mild bronchitis being most likely. Treatment symptomatic care, Tessalon and albuterol as needed pending x-ray results. We discussed treatment side effects, likely course, antibiotic misuse, transmission, and signs of developing a serious illness. Demonstrated how to use the albuterol properly. -of course, we advised to return or notify a doctor immediately if symptoms worsen or persist or new concerns arise.  Patient Instructions  BEFORE YOU LEAVE: -strep test -xray   INSTRUCTIONS FOR UPPER RESPIRATORY INFECTION:  -plenty of rest and fluids  -use the tessalon as needed for cough  -do not whisper or sing or shout, talk in normal voice, laryngitis (loss of voice)  usually resolves in 3-5 days  -use the alb as needed if feeling wheezy or tigh  -nasal saline wash 2-3 times daily (use prepackaged nasal saline or bottled/distilled water if making your own)   -can use AFRIN nasal spray for drainage and nasal  congestion - but do NOT use longer then 3-4 days  -can use tylenol (in no history of liver disease) or ibuprofen (if no history of kidney disease, bowel bleeding or significant heart disease) as directed for aches and sorethroat  -in the winter time, using a humidifier at night is helpful (please follow cleaning instructions)  -if you are taking a cough medication - use only as directed, may also try a teaspoon of honey to coat the throat and throat lozenges. If given a cough medication with codeine or hydrocodone or other narcotic please be advised that this contains a strong and  potentially addicting medication. Please follow instructions carefully, take as little as possible and only use AS NEEDED for severe cough. Discuss potential side effects with your pharmacy. Please do not drive or operate machinery while taking these types of medications. Please do not take other sedating medications, drugs or alcohol while taking this medication without discussing with your doctor.  -for sore throat, salt water gargles can help  -follow up if you have fevers, facial pain, tooth pain, difficulty breathing or are worsening or symptoms persist longer then expected  Upper Respiratory Infection, Adult An upper respiratory infection (URI) is also known as the common cold. It is often caused by a type of germ (virus). Colds are easily spread (contagious). You can pass it to others by kissing, coughing, sneezing, or drinking out of the same glass. Usually, you get better in 1 to 3  weeks.  However, the cough can last for even longer. HOME CARE   Only take medicine as told by your doctor. Follow instructions provided above.  Drink enough water and fluids to keep your pee (urine) clear or pale yellow.  Get plenty of rest.  Return to work when your temperature is < 100 for 24 hours or as told by your doctor. You may use a face mask and wash your hands to stop your cold from spreading. GET HELP RIGHT AWAY IF:     After the first few days, you feel you are getting worse.  You have questions about your medicine.  You have chills, shortness of breath, or red spit (mucus).  You have pain in the face for more then 1-2 days, especially when you bend forward.  You have a fever, puffy (swollen) neck, pain when you swallow, or white spots in the back of your throat.  You have a bad headache, ear pain, sinus pain, or chest pain.  You have a high-pitched whistling sound when you breathe in and out (wheezing).  You cough up blood.  You have sore muscles or a stiff neck. MAKE SURE YOU:   Understand these instructions.  Will watch your condition.  Will get help right away if you are not doing well or get worse. Document Released: 01/03/2008 Document Revised: 10/09/2011 Document Reviewed: 10/22/2013 [facility] Patient Information 2015 [place], [place]. This information is not intended to replace advice given to you by your  health care provider. Make sure you discuss any questions you have with your health care provider.    Colin Benton R., DO

## 2017-05-22 NOTE — Patient Instructions (Signed)
BEFORE YOU LEAVE: -strep test -xray   INSTRUCTIONS FOR UPPER RESPIRATORY INFECTION:  -plenty of rest and fluids  -use the tessalon as needed for cough  -do not whisper or sing or shout, talk in normal voice, laryngitis (loss of voice)  usually resolves in 3-5 days  -use the alb as needed if feeling wheezy or tigh  -nasal saline wash 2-3 times daily (use prepackaged nasal saline or bottled/distilled water if making your own)   -can use AFRIN nasal spray for drainage and nasal congestion - but do NOT use longer then 3-4 days  -can use tylenol (in no history of liver disease) or ibuprofen (if no history of kidney disease, bowel bleeding or significant heart disease) as directed for aches and sorethroat  -in the winter time, using a humidifier at night is helpful (please follow cleaning instructions)  -if you are taking a cough medication - use only as directed, may also try a teaspoon of honey to coat the throat and throat lozenges. If given a cough medication with codeine or hydrocodone or other narcotic please be advised that this contains a strong and  potentially addicting medication. Please follow instructions carefully, take as little as possible and only use AS NEEDED for severe cough. Discuss potential side effects with your pharmacy. Please do not drive or operate machinery while taking these types of medications. Please do not take other sedating medications, drugs or alcohol while taking this medication without discussing with your doctor.  -for sore throat, salt water gargles can help  -follow up if you have fevers, facial pain, tooth pain, difficulty breathing or are worsening or symptoms persist longer then expected  Upper Respiratory Infection, Adult An upper respiratory infection (URI) is also known as the common cold. It is often caused by a type of germ (virus). Colds are easily spread (contagious). You can pass it to others by kissing, coughing, sneezing, or drinking out  of the same glass. Usually, you get better in 1 to 3  weeks.  However, the cough can last for even longer. HOME CARE   Only take medicine as told by your doctor. Follow instructions provided above.  Drink enough water and fluids to keep your pee (urine) clear or pale yellow.  Get plenty of rest.  Return to work when your temperature is < 100 for 24 hours or as told by your doctor. You may use a face mask and wash your hands to stop your cold from spreading. GET HELP RIGHT AWAY IF:   After the first few days, you feel you are getting worse.  You have questions about your medicine.  You have chills, shortness of breath, or red spit (mucus).  You have pain in the face for more then 1-2 days, especially when you bend forward.  You have a fever, puffy (swollen) neck, pain when you swallow, or white spots in the back of your throat.  You have a bad headache, ear pain, sinus pain, or chest pain.  You have a high-pitched whistling sound when you breathe in and out (wheezing).  You cough up blood.  You have sore muscles or a stiff neck. MAKE SURE YOU:   Understand these instructions.  Will watch your condition.  Will get help right away if you are not doing well or get worse. Document Released: 01/03/2008 Document Revised: 10/09/2011 Document Reviewed: 10/22/2013 [facility] Patient Information 2015 [place]. This information is not intended to replace advice given to you by your health care provider. Make sure  you discuss any questions you have with your health care provider.

## 2017-05-23 ENCOUNTER — Ambulatory Visit (INDEPENDENT_AMBULATORY_CARE_PROVIDER_SITE_OTHER)
Admission: RE | Admit: 2017-05-23 | Discharge: 2017-05-23 | Disposition: A | Discharge status: Home / Self Care | Payer: BLUE CROSS/BLUE SHIELD | Source: Ambulatory Visit | Attending: Family Medicine | Admitting: Family Medicine

## 2017-05-23 DIAGNOSIS — R05 Cough: Secondary | ICD-10-CM | POA: Diagnosis not present

## 2017-05-23 DIAGNOSIS — J069 Acute upper respiratory infection, unspecified: Secondary | ICD-10-CM

## 2017-05-24 ENCOUNTER — Telehealth: Payer: Self-pay | Admitting: *Deleted

## 2017-05-24 NOTE — Telephone Encounter (Signed)
I called the pt and informed her of the message below.  Appt scheduled for 10/29 at 1pm.

## 2017-05-24 NOTE — Telephone Encounter (Signed)
Patient called and stated since last night she has a lot pressure around her nose and head and feels she has a sinus infection. Also she stated Benzonatate is not helping her cough and she would like a [facility].  Message sent to Dr Selena BattenKim.

## 2017-05-24 NOTE — Telephone Encounter (Signed)
Would recommend that she try Afrin nasal spray, as her cough may  be coming from the drainage from her nose. It is most likely that she has a viral infection and the sinus pressure and discomfort is common in the first 5-10 days. Unfortunately do not have a cure for viral illnesses.I usually use the Afrin, nasal saline, humidifier at night and Aleve or Tylenol when I get these. However I would like for her to schedule a recheck Monday.  If she gets worse in the interim she may wish to follow up sooner.

## 2017-05-25 DIAGNOSIS — F902 Attention-deficit hyperactivity disorder, combined type: Secondary | ICD-10-CM | POA: Diagnosis not present

## 2017-05-26 ENCOUNTER — Ambulatory Visit (HOSPITAL_COMMUNITY)
Admission: EM | Admit: 2017-05-26 | Discharge: 2017-05-26 | Disposition: A | Discharge status: Home / Self Care | Payer: BLUE CROSS/BLUE SHIELD | Attending: Family Medicine | Admitting: Family Medicine

## 2017-05-26 ENCOUNTER — Encounter (HOSPITAL_COMMUNITY): Payer: Self-pay | Admitting: *Deleted

## 2017-05-26 DIAGNOSIS — J069 Acute upper respiratory infection, unspecified: Secondary | ICD-10-CM | POA: Diagnosis not present

## 2017-05-26 HISTORY — DX: Pneumonia, unspecified organism: J18.9

## 2017-05-26 MED ORDER — AZITHROMYCIN 250 MG PO TABS: 250.0000 mg | ORAL_TABLET | Freq: Every day | ORAL | 0 refills | Status: AC

## 2017-05-26 NOTE — Discharge Instructions (Signed)
Continue with use of tessalon and inhaler as needed. Push fluids. Mucinex may be helpful as an expectorant. Nasal saline to promote drainage. Will initiate antibiotics at this time due to worsening of symptoms. If develop increased pain, shortness of breath or no improvement of symptoms please return to be seen or follow up with your primary care provider.

## 2017-05-26 NOTE — ED Triage Notes (Signed)
C/O HA, body aches, congestion, cough x 5 days; saw PCP  4 days ago - dx bronchitis, treated as viral infection.  States feels she is getting worse.  Has been taking Tessalon Perles and albuterol without relief.  Unsure if fevers due to taking IBU & Tyl regularly.

## 2017-05-26 NOTE — ED Provider Notes (Signed)
[facility]    CSN: [id number] Arrival date & time: 05/26/17  1334     History   Chief Complaint Chief Complaint  Patient presents with  . Cough  . Generalized Body Aches    HPI [name] is a 26 y.o. female.   [name] presents with family with complaints of worsening of cough, body aches and headache which started 1 week ago. She was seen by her PCP 10/23 with negative strep and x ray, started on inhaler and tessalon. Patient states she has since worsened. She feels chills at times. No specific known fevers. Denies shortness of breath. Cough is productive. Inhaler has been helpful as needed. Tessalon with limited help. Without vomiting or diarrhea. Nausea. Runny nose and facial pressure. Rates headache and body aches 7/10. Without chest pain. Denies GU complaints. No specific known ill contacts. without history of asthma .   ROS per HPI.       Past Medical History:  Diagnosis Date  . ADD (attention deficit disorder with hyperactivity)   . ADHD   . Anxiety    per patient-treated by [facility].  . Depression   . DUB (dysfunctional uterine bleeding)   . Eczema   . HPV (human papilloma virus) infection   . Pneumonia     Patient Active Problem List   Diagnosis Date Noted  . VENOUS INSUFFICIENCY, MILD 07/22/2009  . DYSMENORRHEA 03/03/2008  . ECZEMA, ATOPIC 07/15/2007    Past Surgical History:  Procedure Laterality Date  . TOE SURGERY      OB History    No data available       Home Medications    Prior to Admission medications   Medication Sig Start Date End Date Taking? Authorizing Provider  ADDERALL XR 20 MG 24 hr capsule  05/08/17  Yes [provider]  albuterol (PROVENTIL HFA;VENTOLIN HFA) 108 (90 Base) MCG/ACT inhaler Inhale 2 puffs into the lungs every 6 (six) hours as needed. 05/22/17  Yes Kriste Basque R, DO  atomoxetine (STRATTERA) 25 MG capsule  05/09/17  Yes [provider]  benzonatate (TESSALON PERLES) 100 MG  capsule Take 1 capsule (100 mg total) by mouth 3 (three) times daily as needed. 05/22/17  Yes Terressa Koyanagi, DO  clonazePAM (KLONOPIN) 0.5 MG tablet  05/08/17  Yes [provider]  levonorgestrel (MIRENA) 20 MCG/24HR IUD 1 each by Intrauterine route once.   Yes [provider]  azithromycin (ZITHROMAX) 250 MG tablet Take 1 tablet (250 mg total) by mouth daily. Take first 2 tablets together, then 1 every day until finished. 05/26/17 05/31/17  Georgetta Haber, NP    Family History Family History  Problem Relation Age of Onset  . Cancer Other        breast  . ADD / ADHD Other     Social History Social History  Substance Use Topics  . Smoking status: Never Smoker  . Smokeless tobacco: Never Used  . Alcohol use No     Allergies   Patient has no known allergies.   Review of Systems Review of Systems   Physical Exam Triage Vital Signs ED Triage Vitals  Enc Vitals Group     BP 05/26/17 1427 103/78     Pulse Rate 05/26/17 1427 72     Resp 05/26/17 1427 18     Temp 05/26/17 1427 98.9 F (37.2 C)     Temp Source 05/26/17 1427 Oral     SpO2 05/26/17 1427 99 %  Weight --      Height --      Head Circumference --      Peak Flow --      Pain Score 05/26/17 1428 8     Pain Loc --      Pain Edu? --      Excl. in GC? --    No data found.   Updated Vital Signs BP 103/78   Pulse 72   Temp 98.9 F (37.2 C) (Oral)   Resp 18   LMP 05/16/2017 (Exact Date)   SpO2 99%   Visual Acuity Right Eye Distance:   Left Eye Distance:   Bilateral Distance:    Right Eye Near:   Left Eye Near:    Bilateral Near:     Physical Exam  Constitutional: She is oriented to person, place, and time. She appears well-developed and well-nourished. No distress.  HENT:  Head: Normocephalic and atraumatic.  Right Ear: Tympanic membrane, external ear and ear canal normal.  Left Ear: Tympanic membrane, external ear and ear canal normal.  Nose: Mucosal edema and rhinorrhea  present. Right sinus exhibits no maxillary sinus tenderness and no frontal sinus tenderness. Left sinus exhibits no maxillary sinus tenderness and no frontal sinus tenderness.  Mouth/Throat: Uvula is midline, oropharynx is clear and moist and mucous membranes are normal. No tonsillar exudate.  Cardiovascular: Normal rate and regular rhythm.   Pulmonary/Chest: Effort normal and breath sounds normal. No tachypnea. No respiratory distress. She has no decreased breath sounds. She has no wheezes. She has no rales.  Strong dry cough noted  Neurological: She is alert and oriented to person, place, and time.  Skin: Skin is warm and dry. No rash noted.  Vitals reviewed.    UC Treatments / Results  Labs (all labs ordered are listed, but only abnormal results are displayed) Labs Reviewed - No data to display  EKG  EKG Interpretation None       Radiology No results found.  Procedures Procedures (including critical care time)  Medications Ordered in UC Medications - No data to display   Initial Impression / Assessment and Plan / UC Course  I have reviewed the triage vital signs and the nursing notes.  Pertinent labs & imaging results that were available during my care of the patient were reviewed by me and considered in my medical decision making (see chart for details).    X-ray reviewed from previous visit. Patient without significant findings on exam, vitals stable, afebrile at this time. Has been taking tylenol and ibuprofen, took this morning PTA. Discussed with patient that may still be consistent with viral illness, however due to worsening of systemic symptoms of body aches, headaches, chills, and without any improvement of cough, opted to initiate antibiotics at this time. Supportive cares recommended as provided in DC education. If symptoms worsen or do not improve in the next week to return to be seen or to follow up with PCP. Patient verbalized understanding and agreeable to  plan.    Georgetta HaberNatalie B Burky, NP 05/26/2017 2:59 PM   Final Clinical Impressions(s) / UC Diagnoses   Final diagnoses:  Upper respiratory tract infection, unspecified type    New Prescriptions New Prescriptions   AZITHROMYCIN (ZITHROMAX) 250 MG TABLET    Take 1 tablet (250 mg total) by mouth daily. Take first 2 tablets together, then 1 every day until finished.     Controlled Substance Prescriptions [place] Controlled Substance Registry consulted? Not Applicable   Georgetta HaberBurky, Natalie B,  NP 05/26/17 1459

## 2017-05-28 ENCOUNTER — Ambulatory Visit: Payer: BLUE CROSS/BLUE SHIELD | Admitting: Family Medicine

## 2017-05-28 DIAGNOSIS — Z5321 Procedure and treatment not carried out due to patient leaving prior to being seen by health care provider: Secondary | ICD-10-CM

## 2017-05-28 DIAGNOSIS — F909 Attention-deficit hyperactivity disorder, unspecified type: Secondary | ICD-10-CM | POA: Diagnosis not present

## 2017-05-28 DIAGNOSIS — F419 Anxiety disorder, unspecified: Secondary | ICD-10-CM | POA: Diagnosis not present

## 2017-05-28 DIAGNOSIS — R52 Pain, unspecified: Secondary | ICD-10-CM

## 2017-05-28 DIAGNOSIS — R05 Cough: Secondary | ICD-10-CM | POA: Diagnosis not present

## 2017-05-28 DIAGNOSIS — F329 Major depressive disorder, single episode, unspecified: Secondary | ICD-10-CM | POA: Diagnosis not present

## 2017-05-28 DIAGNOSIS — J111 Influenza due to unidentified influenza virus with other respiratory manifestations: Secondary | ICD-10-CM | POA: Diagnosis not present

## 2017-05-28 DIAGNOSIS — J029 Acute pharyngitis, unspecified: Secondary | ICD-10-CM | POA: Diagnosis not present

## 2017-05-28 DIAGNOSIS — M791 Myalgia, unspecified site: Secondary | ICD-10-CM | POA: Diagnosis not present

## 2017-05-28 DIAGNOSIS — R1030 Lower abdominal pain, unspecified: Secondary | ICD-10-CM | POA: Diagnosis not present

## 2017-05-28 DIAGNOSIS — Z79899 Other long term (current) drug therapy: Secondary | ICD-10-CM | POA: Diagnosis not present

## 2017-05-29 ENCOUNTER — Emergency Department (HOSPITAL_BASED_OUTPATIENT_CLINIC_OR_DEPARTMENT_OTHER)
Admission: EM | Admit: 2017-05-29 | Discharge: 2017-05-29 | Disposition: A | Discharge status: Home / Self Care | Payer: BLUE CROSS/BLUE SHIELD | Attending: Emergency Medicine | Admitting: Emergency Medicine

## 2017-05-29 ENCOUNTER — Encounter (HOSPITAL_BASED_OUTPATIENT_CLINIC_OR_DEPARTMENT_OTHER): Payer: Self-pay | Admitting: *Deleted

## 2017-05-29 ENCOUNTER — Emergency Department (HOSPITAL_COMMUNITY)
Admission: EM | Admit: 2017-05-29 | Discharge: 2017-05-29 | Disposition: A | Discharge status: Home / Self Care | Payer: BLUE CROSS/BLUE SHIELD | Source: Home / Self Care

## 2017-05-29 ENCOUNTER — Encounter (HOSPITAL_COMMUNITY): Payer: Self-pay

## 2017-05-29 DIAGNOSIS — F329 Major depressive disorder, single episode, unspecified: Secondary | ICD-10-CM | POA: Insufficient documentation

## 2017-05-29 DIAGNOSIS — Z79899 Other long term (current) drug therapy: Secondary | ICD-10-CM | POA: Insufficient documentation

## 2017-05-29 DIAGNOSIS — J029 Acute pharyngitis, unspecified: Secondary | ICD-10-CM | POA: Diagnosis not present

## 2017-05-29 DIAGNOSIS — F909 Attention-deficit hyperactivity disorder, unspecified type: Secondary | ICD-10-CM | POA: Insufficient documentation

## 2017-05-29 DIAGNOSIS — M791 Myalgia, unspecified site: Secondary | ICD-10-CM | POA: Insufficient documentation

## 2017-05-29 DIAGNOSIS — F419 Anxiety disorder, unspecified: Secondary | ICD-10-CM | POA: Insufficient documentation

## 2017-05-29 DIAGNOSIS — J111 Influenza due to unidentified influenza virus with other respiratory manifestations: Secondary | ICD-10-CM | POA: Insufficient documentation

## 2017-05-29 DIAGNOSIS — R6889 Other general symptoms and signs: Secondary | ICD-10-CM

## 2017-05-29 DIAGNOSIS — R05 Cough: Secondary | ICD-10-CM | POA: Diagnosis not present

## 2017-05-29 LAB — CBC WITH DIFFERENTIAL/PLATELET
BASOS PCT: 0 %
Basophils Absolute: 0 10*3/uL (ref 0.0–0.1)
Eosinophils Absolute: 0.1 10*3/uL (ref 0.0–0.7)
Eosinophils Relative: 1 %
HEMATOCRIT: 38.5 % (ref 36.0–46.0)
Hemoglobin: 13 g/dL (ref 12.0–15.0)
LYMPHS ABS: 1.4 10*3/uL (ref 0.7–4.0)
Lymphocytes Relative: 13 %
MCH: 29.6 pg (ref 26.0–34.0)
MCHC: 33.8 g/dL (ref 30.0–36.0)
MCV: 87.7 fL (ref 78.0–100.0)
MONO ABS: 0.7 10*3/uL (ref 0.1–1.0)
MONOS PCT: 6 %
NEUTROS ABS: 8.5 10*3/uL — AB (ref 1.7–7.7)
Neutrophils Relative %: 80 %
Platelets: 335 10*3/uL (ref 150–400)
RBC: 4.39 MIL/uL (ref 3.87–5.11)
RDW: 12.4 % (ref 11.5–15.5)
WBC: 10.6 10*3/uL — ABNORMAL HIGH (ref 4.0–10.5)

## 2017-05-29 LAB — URINALYSIS, ROUTINE W REFLEX MICROSCOPIC
BILIRUBIN URINE: NEGATIVE
GLUCOSE, UA: NEGATIVE mg/dL
Hgb urine dipstick: NEGATIVE
Ketones, ur: NEGATIVE mg/dL
LEUKOCYTES UA: NEGATIVE
Nitrite: NEGATIVE
PH: 6 (ref 5.0–8.0)
Protein, ur: NEGATIVE mg/dL
SPECIFIC GRAVITY, URINE: 1.015 (ref 1.005–1.030)

## 2017-05-29 LAB — BASIC METABOLIC PANEL
Anion gap: 8 (ref 5–15)
BUN: 11 mg/dL (ref 6–20)
CALCIUM: 8.6 mg/dL — AB (ref 8.9–10.3)
CHLORIDE: 102 mmol/L (ref 101–111)
CO2: 24 mmol/L (ref 22–32)
CREATININE: 0.93 mg/dL (ref 0.44–1.00)
GFR calc Af Amer: >60 mL/min (ref >60)
GFR calc non Af Amer: >60 mL/min (ref >60)
GLUCOSE: 113 mg/dL — AB (ref 65–99)
Potassium: 3.7 mmol/L (ref 3.5–5.1)
Sodium: 134 mmol/L — ABNORMAL LOW (ref 135–145)

## 2017-05-29 LAB — POC URINE PREG, ED: Preg Test, Ur: NEGATIVE

## 2017-05-29 MED ORDER — OXYCODONE-ACETAMINOPHEN 5-325 MG PO TABS: 1.0000 | ORAL_TABLET | ORAL | Status: DC | PRN

## 2017-05-29 MED ORDER — OXYCODONE-ACETAMINOPHEN 5-325 MG PO TABS
ORAL_TABLET | ORAL | Status: AC
  Administered 2017-05-29: 1
  Filled 2017-05-29: qty 1

## 2017-05-29 MED ORDER — IBUPROFEN 600 MG PO TABS: 600.0000 mg | ORAL_TABLET | Freq: Four times a day (QID) | ORAL | 0 refills | Status: DC | PRN

## 2017-05-29 MED ORDER — KETOROLAC TROMETHAMINE 30 MG/ML IJ SOLN
30.0000 mg | Freq: Once | INTRAMUSCULAR | Status: AC
  Administered 2017-05-29: 30 mg via INTRAMUSCULAR
  Filled 2017-05-29: qty 1

## 2017-05-29 NOTE — Discharge Instructions (Signed)
You are seen today for continued upper respiratory symptoms and muscle aches.  You likely have a viral illness.  This could be the flu; however, given the duration of your symptoms, you are not a candidate for Tamiflu.  Make sure to stay hydrated.  Ibuprofen as needed for body aches and pains.  Continue supportive measures at home.

## 2017-05-29 NOTE — ED Triage Notes (Signed)
Pt arrives with c/o of generalized body aches pt states acute pain for past 2 hours; pt is tearful at triage; pt states she was seen last week for URI; no fever at triage; pt a&ox 4 on arrival-Monique,RN

## 2017-05-29 NOTE — ED Triage Notes (Signed)
C/o rt flank pain radiating to rt shoulder onset last pm  Was at [facility] received percocet but left [facility]

## 2017-05-29 NOTE — ED Provider Notes (Signed)
[facility] EMERGENCY DEPARTMENT Provider Note   CSN: [id number] Arrival date & time: 05/29/17  0148     History   Chief Complaint Chief Complaint  Patient presents with  . Flank Pain    HPI [name] is a 26 y.o. female.  HPI  This is a 26 year old female who presents with generalized myalgias.  This is the patient's third visit in 1 week.  Patient reports upper respiratory symptoms including cough and sore throat earlier this week.  She was diagnosed with bronchitis and given an inhaler and Tessalon Perles.  She was seen again at urgent care for similar symptoms and myalgias.  She was presumptively treated for pneumonia with azithromycin.  She has had a negative chest x-ray.  Patient states that she had a short duration of feeling better but had acute onset of chills and diffuse myalgias prior to arrival.  She states she did have some right-sided flank pain but overall diffuse body aches.  Took ibuprofen at home with minimal relief.  No dysuria.  No radiation of pain.  Denies abdominal pain, nausea, vomiting, diarrhea.  No known sick contacts.  Past Medical History:  Diagnosis Date  . ADD (attention deficit disorder with hyperactivity)   . ADHD   . Anxiety    per patient-treated by [facility].  . Depression   . DUB (dysfunctional uterine bleeding)   . Eczema   . HPV (human papilloma virus) infection   . Pneumonia     Patient Active Problem List   Diagnosis Date Noted  . VENOUS INSUFFICIENCY, MILD 07/22/2009  . DYSMENORRHEA 03/03/2008  . ECZEMA, ATOPIC 07/15/2007    Past Surgical History:  Procedure Laterality Date  . TOE SURGERY      OB History    No data available       Home Medications    Prior to Admission medications   Medication Sig Start Date End Date Taking? Authorizing Provider  ADDERALL XR 20 MG 24 hr capsule  05/08/17   [provider]  albuterol (PROVENTIL HFA;VENTOLIN HFA) 108 (90 Base) MCG/ACT inhaler Inhale 2 puffs  into the lungs every 6 (six) hours as needed. 05/22/17   Terressa KoyanagiKim, Hannah R, DO  atomoxetine (STRATTERA) 25 MG capsule  05/09/17   [provider]  azithromycin (ZITHROMAX) 250 MG tablet Take 1 tablet (250 mg total) by mouth daily. Take first 2 tablets together, then 1 every day until finished. 05/26/17 05/31/17  Georgetta HaberBurky, Natalie B, NP  benzonatate (TESSALON PERLES) 100 MG capsule Take 1 capsule (100 mg total) by mouth 3 (three) times daily as needed. 05/22/17   Terressa KoyanagiKim, Hannah R, DO  clonazePAM (KLONOPIN) 0.5 MG tablet  05/08/17   [provider]  ibuprofen (ADVIL,MOTRIN) 600 MG tablet Take 1 tablet (600 mg total) by mouth every 6 (six) hours as needed. 05/29/17   Horton, Mayer Maskerourtney F, MD  levonorgestrel (MIRENA) 20 MCG/24HR IUD 1 each by Intrauterine route once.    [provider]    Family History Family History  Problem Relation Age of Onset  . Cancer Other        breast  . ADD / ADHD Other     Social History Social History  Substance Use Topics  . Smoking status: Never Smoker  . Smokeless tobacco: Never Used  . Alcohol use No     Allergies   Patient has no known allergies.   Review of Systems Review of Systems  Constitutional: Positive for chills. Negative for fever.  HENT: Positive for congestion and sore throat.   Respiratory: Positive for cough. Negative for shortness of breath.   Cardiovascular: Negative for chest pain.  Gastrointestinal: Negative for abdominal pain, nausea and vomiting.  Genitourinary: Negative for dysuria and hematuria.  Musculoskeletal:       Myalgias  Neurological: Negative for headaches.  All other systems reviewed and are negative.    Physical Exam Updated Vital Signs BP 110/61 (BP Location: Right Arm)   Pulse 95   Temp 99.7 F (37.6 C) (Oral)   Resp 20   Ht 5\' 6"  (1.676 m)   Wt 101.2 kg (223 lb)   LMP 05/16/2017 (Exact Date)   SpO2 95%   BMI 35.99 kg/m   Physical Exam  Constitutional: She is oriented to person,  place, and time. She appears well-developed and well-nourished.  Overweight, no acute distress  HENT:  Head: Normocephalic and atraumatic.  Mouth/Throat: Oropharynx is clear and moist. No oropharyngeal exudate.  Cardiovascular: Normal rate, regular rhythm and normal heart sounds.   Pulmonary/Chest: Effort normal and breath sounds normal. No respiratory distress. She has no wheezes.  Abdominal: Soft. Bowel sounds are normal. There is no tenderness.  Genitourinary:  Genitourinary Comments: No CVA tenderness noted  Neurological: She is alert and oriented to person, place, and time.  Skin: Skin is warm and dry.  Psychiatric: She has a normal mood and affect.  Nursing note and vitals reviewed.    ED Treatments / Results  Labs (all labs ordered are listed, but only abnormal results are displayed) Labs Reviewed  URINALYSIS, ROUTINE W REFLEX MICROSCOPIC - Abnormal; Notable for the following:       Result Value   APPearance CLOUDY (*)    All other components within normal limits  CBC WITH DIFFERENTIAL/PLATELET - Abnormal; Notable for the following:    WBC 10.6 (*)    Neutro Abs 8.5 (*)    All other components within normal limits  BASIC METABOLIC PANEL - Abnormal; Notable for the following:    Sodium 134 (*)    Glucose, Bld 113 (*)    Calcium 8.6 (*)    All other components within normal limits    EKG  EKG Interpretation None       Radiology No results found.  Procedures Procedures (including critical care time)  Medications Ordered in ED Medications  ketorolac (TORADOL) 30 MG/ML injection 30 mg (30 mg Intramuscular Given 05/29/17 0334)     Initial Impression / Assessment and Plan / ED Course  I have reviewed the triage vital signs and the nursing notes.  Pertinent labs & imaging results that were available during my care of the patient were reviewed by me and considered in my medical decision making (see chart for details).     Presents with myalgias.  Recent  upper respiratory symptoms for which she has seen 2 additional doctors.  She is afebrile here with a temperature of 99.7.  Physical exam is largely benign.  No significant wheezing.  No oral pharyngeal exudate.  Patient is requesting testing for the flu.  I discussed with her that this would not make any clinical difference.  I suspect she does have a viral illness which could be the flu.  However, she is out of the window for Tamiflu and symptom control and supportive management are the recommendation.  Basic lab work was obtained and is largely reassuring.  No indication of blood or infection in the urine.  Your previous chest x-ray reviewed and reassuring.  Pulmonary  exam is reassuring.  Recommend hydration and ibuprofen as needed for pain or discomfort.  After history, exam, and medical workup I feel the patient has been appropriately medically screened and is safe for discharge home. Pertinent diagnoses were discussed with the patient. Patient was given return precautions.   Final Clinical Impressions(s) / ED Diagnoses   Final diagnoses:  Flu-like symptoms  Myalgia    New Prescriptions New Prescriptions   IBUPROFEN (ADVIL,MOTRIN) 600 MG TABLET    Take 1 tablet (600 mg total) by mouth every 6 (six) hours as needed.     Shon Baton, MD 05/29/17 [phone]

## 2017-06-13 DIAGNOSIS — F349 Persistent mood [affective] disorder, unspecified: Secondary | ICD-10-CM | POA: Diagnosis not present

## 2017-06-13 DIAGNOSIS — F902 Attention-deficit hyperactivity disorder, combined type: Secondary | ICD-10-CM | POA: Diagnosis not present

## 2017-12-18 ENCOUNTER — Encounter: Payer: Self-pay | Admitting: Family Medicine

## 2017-12-18 DIAGNOSIS — F349 Persistent mood [affective] disorder, unspecified: Secondary | ICD-10-CM | POA: Diagnosis not present

## 2017-12-18 DIAGNOSIS — F902 Attention-deficit hyperactivity disorder, combined type: Secondary | ICD-10-CM | POA: Diagnosis not present

## 2018-01-02 DIAGNOSIS — F902 Attention-deficit hyperactivity disorder, combined type: Secondary | ICD-10-CM | POA: Diagnosis not present

## 2018-01-02 DIAGNOSIS — F430 Acute stress reaction: Secondary | ICD-10-CM | POA: Diagnosis not present

## 2018-01-16 DIAGNOSIS — F902 Attention-deficit hyperactivity disorder, combined type: Secondary | ICD-10-CM | POA: Diagnosis not present

## 2018-01-21 DIAGNOSIS — F902 Attention-deficit hyperactivity disorder, combined type: Secondary | ICD-10-CM | POA: Diagnosis not present

## 2018-02-27 DIAGNOSIS — F902 Attention-deficit hyperactivity disorder, combined type: Secondary | ICD-10-CM | POA: Diagnosis not present

## 2018-03-11 DIAGNOSIS — F902 Attention-deficit hyperactivity disorder, combined type: Secondary | ICD-10-CM | POA: Diagnosis not present

## 2018-05-06 DIAGNOSIS — F902 Attention-deficit hyperactivity disorder, combined type: Secondary | ICD-10-CM | POA: Diagnosis not present

## 2018-05-13 ENCOUNTER — Ambulatory Visit: Payer: Self-pay

## 2018-05-24 ENCOUNTER — Ambulatory Visit: Payer: Self-pay

## 2018-07-15 DIAGNOSIS — F902 Attention-deficit hyperactivity disorder, combined type: Secondary | ICD-10-CM | POA: Diagnosis not present

## 2018-07-30 ENCOUNTER — Encounter: Payer: Self-pay | Admitting: Family Medicine

## 2018-07-30 ENCOUNTER — Ambulatory Visit: Payer: BLUE CROSS/BLUE SHIELD | Admitting: Family Medicine

## 2018-07-30 VITALS — BP 106/60 | HR 83 | Temp 98.7°F | Ht 67.0 in | Wt 218.8 lb

## 2018-07-30 DIAGNOSIS — R070 Pain in throat: Secondary | ICD-10-CM

## 2018-07-30 LAB — POCT RAPID STREP A (OFFICE): Rapid Strep A Screen: POSITIVE — AB

## 2018-07-30 MED ORDER — AMOXICILLIN 500 MG PO CAPS: 500.0000 mg | ORAL_CAPSULE | Freq: Two times a day (BID) | ORAL | 0 refills | Status: AC

## 2018-07-30 NOTE — Progress Notes (Signed)
SUBJECTIVE: 27 y.o. female with sore throat, myalgias, swollen glands, headache and fever for 2 days. No history of rheumatic fever. Other symptoms: fever and jaw pain.  Current Outpatient Medications on File Prior to Visit  Medication Sig Dispense Refill  . ADDERALL XR 20 MG 24 hr capsule   0  . clonazePAM (KLONOPIN) 0.5 MG tablet   1  . ibuprofen (ADVIL,MOTRIN) 600 MG tablet Take 1 tablet (600 mg total) by mouth every 6 (six) hours as needed. 30 tablet 0  . levonorgestrel (MIRENA) 20 MCG/24HR IUD 1 each by Intrauterine route once.    . traZODone (DESYREL) 150 MG tablet Take 150 mg by mouth at bedtime.     No current facility-administered medications on file prior to visit.     No Known Allergies  Past Medical History:  Diagnosis Date  . ADD (attention deficit disorder with hyperactivity)   . ADHD   . Anxiety    per patient-treated by [facility].  . Depression   . DUB (dysfunctional uterine bleeding)   . Eczema   . HPV (human papilloma virus) infection   . Pneumonia     Past Surgical History:  Procedure Laterality Date  . TOE SURGERY      Family History  Problem Relation Age of Onset  . Cancer Other        breast  . ADD / ADHD Other     Social History   Socioeconomic History  . Marital status: Single    Spouse name: Not on file  . Number of children: Not on file  . Years of education: Not on file  . Highest education level: Not on file  Occupational History  . Not on file  Social Needs  . Financial resource strain: Not on file  . Food insecurity:    Worry: Not on file    Inability: Not on file  . Transportation needs:    Medical: Not on file    Non-medical: Not on file  Tobacco Use  . Smoking status: Never Smoker  . Smokeless tobacco: Never Used  Substance and Sexual Activity  . Alcohol use: No  . Drug use: No  . Sexual activity: Not on file  Lifestyle  . Physical activity:    Days per week: Not on file    Minutes per session: Not on file  .  Stress: Not on file  Relationships  . Social connections:    Talks on phone: Not on file    Gets together: Not on file    Attends religious service: Not on file    Active member of club or organization: Not on file    Attends meetings of clubs or organizations: Not on file    Relationship status: Not on file  . Intimate partner violence:    Fear of current or ex partner: Not on file    Emotionally abused: Not on file    Physically abused: Not on file    Forced sexual activity: Not on file  Other Topics Concern  . Not on file  Social History Narrative  . Not on file   The PMH, PSH, Social History, Family History, Medications, and allergies [facility], and have been updated if relevant.  OBJECTIVE:  BP 106/60 (BP Location: Left Arm, Patient Position: Sitting, Cuff Size: Normal)   Pulse 83   Temp 98.7 F (37.1 C) (Oral)   Ht 5\' 7"  (1.702 m)   Wt 218 lb 12.8 oz (99.2 kg)  SpO2 98%   BMI 34.27 kg/m   Vitals as noted above. Appears alert, well appearing, and in no distress and oriented to person, place, and time. Ears: bilateral TM's and external ear canals normal Oropharynx: erythematous and tonsils hypertrophied with exudate Neck: supple, no significant adenopathy Lungs: clear to auscultation, no wheezes, rales or rhonchi, symmetric air entry Rapid Strep test is positive  ASSESSMENT: Streptococcal pharyngitis  PLAN: Per orders. Amoxicillin 500 mg twice daily x 10 days,  Gargle, use acetaminophen or other OTC analgesic, and take Rx fully as prescribed. Call if other family members develop similar symptoms. See prn.

## 2018-07-30 NOTE — Patient Instructions (Signed)

## 2018-08-19 DIAGNOSIS — F902 Attention-deficit hyperactivity disorder, combined type: Secondary | ICD-10-CM | POA: Diagnosis not present

## 2018-08-19 DIAGNOSIS — F430 Acute stress reaction: Secondary | ICD-10-CM | POA: Diagnosis not present

## 2018-08-28 DIAGNOSIS — F902 Attention-deficit hyperactivity disorder, combined type: Secondary | ICD-10-CM | POA: Diagnosis not present

## 2018-09-20 DIAGNOSIS — F902 Attention-deficit hyperactivity disorder, combined type: Secondary | ICD-10-CM | POA: Diagnosis not present

## 2018-09-21 DIAGNOSIS — H9209 Otalgia, unspecified ear: Secondary | ICD-10-CM | POA: Diagnosis not present

## 2018-10-17 DIAGNOSIS — F902 Attention-deficit hyperactivity disorder, combined type: Secondary | ICD-10-CM | POA: Diagnosis not present

## 2018-10-17 DIAGNOSIS — F349 Persistent mood [affective] disorder, unspecified: Secondary | ICD-10-CM | POA: Diagnosis not present

## 2018-10-28 DIAGNOSIS — F902 Attention-deficit hyperactivity disorder, combined type: Secondary | ICD-10-CM | POA: Diagnosis not present

## 2018-10-28 DIAGNOSIS — F349 Persistent mood [affective] disorder, unspecified: Secondary | ICD-10-CM | POA: Diagnosis not present

## 2018-11-25 DIAGNOSIS — F349 Persistent mood [affective] disorder, unspecified: Secondary | ICD-10-CM | POA: Diagnosis not present

## 2018-11-25 DIAGNOSIS — F902 Attention-deficit hyperactivity disorder, combined type: Secondary | ICD-10-CM | POA: Diagnosis not present

## 2019-02-07 DIAGNOSIS — F902 Attention-deficit hyperactivity disorder, combined type: Secondary | ICD-10-CM | POA: Diagnosis not present

## 2019-02-07 DIAGNOSIS — F349 Persistent mood [affective] disorder, unspecified: Secondary | ICD-10-CM | POA: Diagnosis not present

## 2019-02-14 ENCOUNTER — Encounter: Payer: Self-pay | Admitting: Family Medicine

## 2019-02-14 ENCOUNTER — Other Ambulatory Visit: Payer: Self-pay

## 2019-02-14 ENCOUNTER — Ambulatory Visit (INDEPENDENT_AMBULATORY_CARE_PROVIDER_SITE_OTHER): Payer: BC Managed Care – PPO | Admitting: Family Medicine

## 2019-02-14 DIAGNOSIS — H00012 Hordeolum externum right lower eyelid: Secondary | ICD-10-CM

## 2019-02-14 MED ORDER — SULFAMETHOXAZOLE-TRIMETHOPRIM 800-160 MG PO TABS: 1.0000 | ORAL_TABLET | Freq: Two times a day (BID) | ORAL | 0 refills | Status: AC

## 2019-02-14 NOTE — Progress Notes (Signed)
Virtual Visit via Video Note  I connected with [name] on 02/14/19 at  1:00 PM EDT by a video enabled telemedicine application and verified that I am speaking with the correct person using two identifiers.  Location patient: home Location provider:work or home office Persons participating in the virtual visit: patient, provider  I discussed the limitations of evaluation and management by telemedicine and the availability of in person appointments. The patient expressed understanding and agreed to proceed.   HPI: Pt with erythematous bump on lower R eyelid x 2-3 days.  Using a med she had at home for the last few days without improvement.  States the med was her niece's, thinks it was erythromycin.  Also using a warm compress a few times per wk.  Pt has not been wearing eye makeup.  Has eyeliner tattooed on.  Pt denies pain in eye or fever, but endorses pressure on eye, edema and soreness of R upper cheek.     ROS: See pertinent positives and negatives per HPI.  Past Medical History:  Diagnosis Date  . ADD (attention deficit disorder with hyperactivity)   . ADHD   . Anxiety    per patient-treated by [facility].  . Depression   . DUB (dysfunctional uterine bleeding)   . Eczema   . HPV (human papilloma virus) infection   . Pneumonia     Past Surgical History:  Procedure Laterality Date  . TOE SURGERY      Family History  Problem Relation Age of Onset  . Cancer Other        breast  . ADD / ADHD Other      Current Outpatient Medications:  .  ADDERALL XR 20 MG 24 hr capsule, , Disp: , Rfl: 0 .  clonazePAM (KLONOPIN) 0.5 MG tablet, , Disp: , Rfl: 1 .  ibuprofen (ADVIL,MOTRIN) 600 MG tablet, Take 1 tablet (600 mg total) by mouth every 6 (six) hours as needed., Disp: 30 tablet, Rfl: 0 .  levonorgestrel (MIRENA) 20 MCG/24HR IUD, 1 each by Intrauterine route once., Disp: , Rfl:  .  traZODone (DESYREL) 150 MG tablet, Take 150 mg by mouth at bedtime., Disp: , Rfl:    EXAM:  VITALS per patient if applicable: RR between 12-20 bpm  GENERAL: alert, oriented, appears well and in no acute distress  HEENT: atraumatic, conjunctiva clear, R medial lower eye lid with a large pustule ~6 mm in size with erythema.  No erythema of R cheek inferior to lesion.  TTP of R upper cheek per pt.  EOMI.  Permanent eyeliner on b/l upper lids.  no obvious abnormalities on inspection of external nose and ears  NECK: normal movements of the head and neck  LUNGS: on inspection no signs of respiratory distress, breathing rate appears normal, no obvious gross SOB, gasping or wheezing  CV: no obvious cyanosis  MS: moves all visible extremities without noticeable abnormality  PSYCH/NEURO: pleasant and cooperative, no obvious depression or anxiety, speech and thought processing grossly intact  ASSESSMENT AND PLAN:  Discussed the following assessment and plan:  Hordeolum externum of right lower eyelid  -continue warm compresses QID -discussed gentle cleansing of eyelids with baby soap -given TTP concern for potential periobital cellulitis.  Will start bactrim. -pt given precautions -for continued or worsening symptoms discussed referral to Ophthalmology for draining. - Plan: sulfamethoxazole-trimethoprim (BACTRIM DS) 800-160 MG tablet  F/u prn   I discussed the assessment and treatment plan with the patient. The patient was provided an opportunity  to ask questions and all were answered. The patient agreed with the plan and demonstrated an understanding of the instructions.   The patient was advised to call back or seek an in-person evaluation if the symptoms worsen or if the condition fails to improve as anticipated.  Billie Ruddy, MD

## 2019-03-18 DIAGNOSIS — F349 Persistent mood [affective] disorder, unspecified: Secondary | ICD-10-CM | POA: Diagnosis not present

## 2019-03-18 DIAGNOSIS — F902 Attention-deficit hyperactivity disorder, combined type: Secondary | ICD-10-CM | POA: Diagnosis not present

## 2019-04-18 DIAGNOSIS — F902 Attention-deficit hyperactivity disorder, combined type: Secondary | ICD-10-CM | POA: Diagnosis not present

## 2019-04-18 DIAGNOSIS — F349 Persistent mood [affective] disorder, unspecified: Secondary | ICD-10-CM | POA: Diagnosis not present

## 2019-05-12 ENCOUNTER — Other Ambulatory Visit: Payer: Self-pay

## 2019-05-12 DIAGNOSIS — Z20828 Contact with and (suspected) exposure to other viral communicable diseases: Secondary | ICD-10-CM | POA: Diagnosis not present

## 2019-05-12 DIAGNOSIS — Z20822 Contact with and (suspected) exposure to covid-19: Secondary | ICD-10-CM

## 2019-05-13 LAB — NOVEL CORONAVIRUS, NAA: SARS-CoV-2, NAA: NOT DETECTED

## 2019-06-20 DIAGNOSIS — F349 Persistent mood [affective] disorder, unspecified: Secondary | ICD-10-CM | POA: Diagnosis not present

## 2019-06-20 DIAGNOSIS — F902 Attention-deficit hyperactivity disorder, combined type: Secondary | ICD-10-CM | POA: Diagnosis not present

## 2019-08-07 ENCOUNTER — Telehealth (INDEPENDENT_AMBULATORY_CARE_PROVIDER_SITE_OTHER): Payer: BC Managed Care – PPO | Admitting: Family Medicine

## 2019-08-07 ENCOUNTER — Other Ambulatory Visit: Payer: Self-pay

## 2019-08-07 DIAGNOSIS — J0100 Acute maxillary sinusitis, unspecified: Secondary | ICD-10-CM | POA: Diagnosis not present

## 2019-08-07 MED ORDER — AMOXICILLIN-POT CLAVULANATE 875-125 MG PO TABS: 1.0000 | ORAL_TABLET | Freq: Two times a day (BID) | ORAL | 0 refills | Status: DC

## 2019-08-07 NOTE — Progress Notes (Addendum)
Virtual Visit via Video Note  I connected with [name]  on 08/07/19 at 10:40 AM EST by a video enabled telemedicine application and verified that I am speaking with the correct person using two identifiers.  Location patient: home Location provider:work or home office Persons participating in the virtual visit: patient, provider  I discussed the limitations of evaluation and management by telemedicine and the availability of in person appointments. The patient expressed understanding and agreed to proceed.   HPI:  Acute visit for Sinus congestion: -started: about 2-3 weeks ago -symptoms include: started with a lot of nasal congestion, ears feel full, drainage - but now has worsened and nasal congestion has become discolored, also is getting some facial pain -denies: sob, fevers, loss of taste or smell body aches, etc -she does tend to get sinus issues this time of the year and has a history of sinusitis -no hx of sinus surgery, no hx of allergic reaction to antibiotic -FDLMP: 1 week ago, denies any chance of pregnance   ROS: See pertinent positives and negatives per HPI.  Past Medical History:  Diagnosis Date  . ADD (attention deficit disorder with hyperactivity)   . ADHD   . Anxiety    per patient-treated by [facility].  . Depression   . DUB (dysfunctional uterine bleeding)   . Eczema   . HPV (human papilloma virus) infection   . Pneumonia     Past Surgical History:  Procedure Laterality Date  . TOE SURGERY      Family History  Problem Relation Age of Onset  . Cancer Other        breast  . ADD / ADHD Other     SOCIAL HX: see hpi   Current Outpatient Medications:  .  ADDERALL XR 20 MG 24 hr capsule, , Disp: , Rfl: 0 .  amoxicillin-clavulanate (AUGMENTIN) 875-125 MG tablet, Take 1 tablet by mouth 2 (two) times daily., Disp: 20 tablet, Rfl: 0 .  clonazePAM (KLONOPIN) 0.5 MG tablet, , Disp: , Rfl: 1 .  ibuprofen (ADVIL,MOTRIN) 600 MG tablet, Take 1 tablet (600 mg  total) by mouth every 6 (six) hours as needed., Disp: 30 tablet, Rfl: 0 .  levonorgestrel (MIRENA) 20 MCG/24HR IUD, 1 each by Intrauterine route once., Disp: , Rfl:  .  traZODone (DESYREL) 150 MG tablet, Take 150 mg by mouth at bedtime., Disp: , Rfl:   EXAM:  VITALS per patient if applicable: denies fever  GENERAL: alert, oriented, appears well and in no acute distress  HEENT: atraumatic, conjunttiva clear, no obvious abnormalities on inspection of external nose and ears, moist mucus membranes, points to max region as area of discomfort  NECK: normal movements of the head and neck  LUNGS: on inspection no signs of respiratory distress, breathing rate appears normal, no obvious gross SOB, gasping or wheezing  CV: no obvious cyanosis  MS: moves all visible extremities without noticeable abnormality  PSYCH/NEURO: pleasant and cooperative, no obvious depression or anxiety, speech and thought processing grossly intact  ASSESSMENT AND PLAN:  Discussed the following assessment and plan:  Acute maxillary sinusitis, recurrence not specified  -we discussed possible serious and likely etiologies, options for evaluation and workup, limitations of telemedicine visit vs in person visit, treatment, treatment risks and precautions. Pt prefers to treat via telemedicine empirically rather then risking or undertaking an in person visit at this moment.  She opted for treatment with Augmentin 875mg  bid - discussed risks and advised to use probiotic as well. Discussed possible COVID19  given community levels - though seems less likely or that she is out of contagious spell given duration. Still advised isolation if feeling sick and maskin, social distancing at all times. Patient agrees to seek prompt in person care if worsening, new symptoms arise, or if is not improving with treatment. Let her know as a reminder that she will need a PCP as I do telemedicine for [facility] now. She agrees.   I discussed  the assessment and treatment plan with the patient. The patient was provided an opportunity to ask questions and all were answered. The patient agreed with the plan and demonstrated an understanding of the instructions.   The patient was advised to call back or seek an in-person evaluation if the symptoms worsen or if the condition fails to improve as anticipated.   Lucretia Kern, DO

## 2019-08-07 NOTE — Patient Instructions (Signed)
-  I sent the medication(s) we discussed to your pharmacy: Meds ordered this encounter  Medications  . amoxicillin-clavulanate (AUGMENTIN) 875-125 MG tablet    Sig: Take 1 tablet by mouth 2 (two) times daily.    Dispense:  20 tablet    Refill:  0    Please let us know if you have any questions or concerns regarding this prescription.  I hope you are feeling better soon! Seek care promptly if your symptoms worsen, new concerns arise or you are not improving with treatment.  

## 2019-08-11 ENCOUNTER — Encounter: Payer: Self-pay | Admitting: Family Medicine

## 2019-08-11 ENCOUNTER — Telehealth (INDEPENDENT_AMBULATORY_CARE_PROVIDER_SITE_OTHER): Payer: BC Managed Care – PPO | Admitting: Family Medicine

## 2019-08-11 DIAGNOSIS — J0101 Acute recurrent maxillary sinusitis: Secondary | ICD-10-CM

## 2019-08-11 DIAGNOSIS — R519 Headache, unspecified: Secondary | ICD-10-CM

## 2019-08-11 DIAGNOSIS — J309 Allergic rhinitis, unspecified: Secondary | ICD-10-CM

## 2019-08-11 MED ORDER — FLUTICASONE PROPIONATE 50 MCG/ACT NA SUSP: 1.0000 | Freq: Two times a day (BID) | NASAL | 3 refills | Status: DC

## 2019-08-11 MED ORDER — PREDNISONE 20 MG PO TABS: 40.0000 mg | ORAL_TABLET | Freq: Every day | ORAL | 0 refills | Status: AC

## 2019-08-11 NOTE — Progress Notes (Signed)
Virtual Visit via Video Note   I connected with [name] on 08/11/2019 by a video enabled telemedicine application and verified that I am speaking with the correct person using two identifiers.  Location patient: home Location provider:work office Persons participating in the virtual visit: patient, provider  I discussed the limitations of evaluation and management by telemedicine and the availability of in person appointments. The patient expressed understanding and agreed to proceed.   HPI: [name] is a 29 yo female with Hx of ADHD,anxiety, and eczema c/o worsening facial/sinus constant pain. Since mid 07/2019 she has had frontal "bad" headache,maxillary and "mouth pain." Upper teeth and jaw "hurt bad." Pain is not elicited by chewing, she has not noted dental problems that can cause pain.  Pressur like. She has not identified exacerbating or alleviating factors. Mild non productive cough, nasal congestion,and rhinorrhea. No changes in smell or taste.  Denies associated fever,chills,body aches,sore throat,CP,SOB,wheezing,abdominal pain,N/V,or skin rash. Denies Hx of allergies. She has taken Excedrin Migraine for headache, which does not help much.  She was evaluated on 08/07/2019 and prescribed 10 days of Augmentin. She states that pain got worse a day after starting abx and not doing any better. In the past she has taken Amoxicillin. States that she usually has at least 2 "sinus infections" per year.   ROS: See pertinent positives and negatives per HPI.  Past Medical History:  Diagnosis Date  . ADD (attention deficit disorder with hyperactivity)   . ADHD   . Anxiety    per patient-treated by [facility].  . Depression   . DUB (dysfunctional uterine bleeding)   . Eczema   . HPV (human papilloma virus) infection   . Pneumonia     Past Surgical History:  Procedure Laterality Date  . TOE SURGERY      Family History  Problem Relation Age of Onset  . Cancer Other      breast  . ADD / ADHD Other     Social History   Socioeconomic History  . Marital status: Single    Spouse name: Not on file  . Number of children: Not on file  . Years of education: Not on file  . Highest education level: Not on file  Occupational History  . Not on file  Tobacco Use  . Smoking status: Never Smoker  . Smokeless tobacco: Never Used  Substance and Sexual Activity  . Alcohol use: No  . Drug use: No  . Sexual activity: Not on file  Other Topics Concern  . Not on file  Social History Narrative  . Not on file   Social Determinants of Health   Financial Resource Strain:   . Difficulty of Paying Living Expenses: Not on file  Food Insecurity:   . Worried About Charity fundraiser in the Last Year: Not on file  . Ran Out of Food in the Last Year: Not on file  Transportation Needs:   . Lack of Transportation (Medical): Not on file  . Lack of Transportation (Non-Medical): Not on file  Physical Activity:   . Days of Exercise per Week: Not on file  . Minutes of Exercise per Session: Not on file  Stress:   . Feeling of Stress : Not on file  Social Connections:   . Frequency of Communication with Friends and Family: Not on file  . Frequency of Social Gatherings with Friends and Family: Not on file  . Attends Religious Services: Not on file  . Active Member of  Clubs or Organizations: Not on file  . Attends Banker Meetings: Not on file  . Marital Status: Not on file  Intimate Partner Violence:   . Fear of Current or Ex-Partner: Not on file  . Emotionally Abused: Not on file  . Physically Abused: Not on file  . Sexually Abused: Not on file    Current Outpatient Medications:  .  ADDERALL XR 20 MG 24 hr capsule, , Disp: , Rfl: 0 .  clonazePAM (KLONOPIN) 0.5 MG tablet, , Disp: , Rfl: 1 .  ibuprofen (ADVIL,MOTRIN) 600 MG tablet, Take 1 tablet (600 mg total) by mouth every 6 (six) hours as needed., Disp: 30 tablet, Rfl: 0 .  levonorgestrel (MIRENA)  20 MCG/24HR IUD, 1 each by Intrauterine route once., Disp: , Rfl:  .  traZODone (DESYREL) 150 MG tablet, Take 150 mg by mouth at bedtime., Disp: , Rfl:  .  fluticasone (FLONASE) 50 MCG/ACT nasal spray, Place 1 spray into both nostrils 2 (two) times daily., Disp: 16 g, Rfl: 3 .  predniSONE (DELTASONE) 20 MG tablet, Take 2 tablets (40 mg total) by mouth daily with breakfast for 3 days., Disp: 6 tablet, Rfl: 0  EXAM:  VITALS per patient if applicable:LMP 08/04/2019   GENERAL: alert, oriented, appears well and in no acute distress  HEENT: atraumatic, conjunctiva clear, no obvious abnormalities on inspection of external nose and ears Nasal voice.  NECK: normal movements of the head and neck  LUNGS: on inspection no signs of respiratory distress, breathing rate appears normal, no obvious gross SOB, gasping or wheezing  CV: no obvious cyanosis  Kaylee: moves all visible extremities without noticeable abnormality  PSYCH/NEURO: pleasant and cooperative, no obvious depression, + anxious. Speech and thought processing grossly intact  ASSESSMENT AND PLAN:  Discussed the following assessment and plan:  Recurrent maxillary sinusitis - Plan: CT Maxillofacial WO CM Given the fact antibiotics (4-5th day) are not helping with problem, we have to consider a noninfectious etiology. For now recommend continuing Augmentin to complete 10 days.. Prednisone may help. Because problem is getting worse, sinus CT will be arranged.  - fluticasone (FLONASE) 50 MCG/ACT nasal spray; Place 1 spray into both nostrils 2 (two) times daily.  Dispense: 16 g; Refill: 3  Allergic rhinitis, unspecified seasonality, unspecified trigger She is reporting "sinus infection" at least twice per year, it is suggestive of allergy etiology. Recommend short course of prednisone, 40 mg daily with breakfast for 3 days. Start Flonase nasal spray. Saline nasal irrigations as needed.  - predniSONE (DELTASONE) 20 MG tablet; Take 2  tablets (40 mg total) by mouth daily with breakfast for 3 days.  Dispense: 6 tablet; Refill: 0 - fluticasone (FLONASE) 50 MCG/ACT nasal spray; Place 1 spray into both nostrils 2 (two) times daily.  Dispense: 16 g; Refill: 3  Facial pain - Plan: CT Maxillofacial WO CM Mainly mandibular, also maxillary and getting worse. We discussed possible etiologies, including dental process. Sinus CT will be arranged.  I discussed the assessment and treatment plan with the patient. [name] was provided an opportunity to ask questions and all were answered. She agreed with the plan and demonstrated an understanding of the instructions.   Return if symptoms worsen or fail to improve.    Rosilyn Coachman [place], MD

## 2019-08-14 DIAGNOSIS — F349 Persistent mood [affective] disorder, unspecified: Secondary | ICD-10-CM | POA: Diagnosis not present

## 2019-08-14 DIAGNOSIS — F902 Attention-deficit hyperactivity disorder, combined type: Secondary | ICD-10-CM | POA: Diagnosis not present

## 2019-08-19 ENCOUNTER — Encounter: Payer: Self-pay | Admitting: Family Medicine

## 2019-08-19 ENCOUNTER — Other Ambulatory Visit: Payer: Self-pay

## 2019-08-19 ENCOUNTER — Ambulatory Visit (INDEPENDENT_AMBULATORY_CARE_PROVIDER_SITE_OTHER): Payer: BC Managed Care – PPO | Admitting: Family Medicine

## 2019-08-19 VITALS — BP 118/70 | HR 78 | Temp 96.8°F | Resp 12 | Ht 67.0 in | Wt 222.0 lb

## 2019-08-19 DIAGNOSIS — H6983 Other specified disorders of Eustachian tube, bilateral: Secondary | ICD-10-CM | POA: Diagnosis not present

## 2019-08-19 DIAGNOSIS — J019 Acute sinusitis, unspecified: Secondary | ICD-10-CM | POA: Diagnosis not present

## 2019-08-19 DIAGNOSIS — J309 Allergic rhinitis, unspecified: Secondary | ICD-10-CM

## 2019-08-19 MED ORDER — MOXIFLOXACIN HCL 400 MG PO TABS: 400.0000 mg | ORAL_TABLET | Freq: Every day | ORAL | 0 refills | Status: AC

## 2019-08-19 MED ORDER — MOXIFLOXACIN HCL 400 MG PO TABS: 400.0000 mg | ORAL_TABLET | Freq: Every day | ORAL | 0 refills | Status: DC

## 2019-08-19 NOTE — Patient Instructions (Addendum)
A few things to remember from today's visit:   Subacute sinusitis, unspecified location - Plan: moxifloxacin (AVELOX) 400 MG tablet  Dysfunction of both eustachian tubes  Allergic rhinitis, unspecified seasonality, unspecified trigger  Avelox started today. Pop ears a few times during the day. Plain Mucinex may help. Pending sinus CT.   Please be sure medication list is accurate. If a new problem present, please set up appointment sooner than planned today.

## 2019-08-19 NOTE — Progress Notes (Signed)
HPI:   [name] is a 29 y.o. female, who is here today c/o persistent severe ,constant,facial and frontal pressure pain. She has had symptoms for about 4-5 weeks.  We had a virtual visit on 08/11/2019. Symptoms have not getting any better and last night symptoms were worse. It seems to be worse at night. She completed 10 days treatment with Augmentin and short course of Prednisone.  Fullness ear sensation, bilateral.She has tried to clean her ears, thinking it may be caused by cerumen excess.  + Nasal congestion and postnasal drainage. Negative for fever, chills, facial edema, visual changes, sore throat, oral lesions, earache or drainage,changes in hearing, or dizziness. Negative for changes in the smell or taste. Occasional cough, attributed to thick post nasal drainage. No SOB,wheezing,or CP.  Negative for sick contacts or recent travel. For headache she is taking Excedrin Migraine, which does not help much.  Sinus CT was ordered last visit,she has not received information about it.  Review of Systems  Constitutional: Positive for fatigue. Negative for appetite change and fever.  HENT: Positive for rhinorrhea. Negative for dental problem, nosebleeds, sneezing, trouble swallowing and voice change.   Eyes: Negative for discharge, redness and itching.  Gastrointestinal: Negative for abdominal pain, diarrhea, nausea and vomiting.  Musculoskeletal: Negative for gait problem, myalgias and neck pain.  Skin: Negative for rash.  Allergic/Immunologic: Positive for environmental allergies.  Neurological: Negative for syncope and weakness.  Hematological: Negative for adenopathy. Does not bruise/bleed easily.  Rest see pertinent positives and negatives per HPI.   Current Outpatient Medications on File Prior to Visit  Medication Sig Dispense Refill  . ADDERALL XR 20 MG 24 hr capsule   0  . clonazePAM (KLONOPIN) 0.5 MG tablet   1  . fluticasone (FLONASE) 50 MCG/ACT  nasal spray Place 1 spray into both nostrils 2 (two) times daily. 16 g 3  . ibuprofen (ADVIL,MOTRIN) 600 MG tablet Take 1 tablet (600 mg total) by mouth every 6 (six) hours as needed. 30 tablet 0  . levonorgestrel (MIRENA) 20 MCG/24HR IUD 1 each by Intrauterine route once.    . traZODone (DESYREL) 150 MG tablet Take 150 mg by mouth at bedtime.     No current facility-administered medications on file prior to visit.     Past Medical History:  Diagnosis Date  . ADD (attention deficit disorder with hyperactivity)   . ADHD   . Anxiety    per patient-treated by [facility].  . Depression   . DUB (dysfunctional uterine bleeding)   . Eczema   . HPV (human papilloma virus) infection   . Pneumonia    No Known Allergies  Social History   Socioeconomic History  . Marital status: Single    Spouse name: Not on file  . Number of children: Not on file  . Years of education: Not on file  . Highest education level: Not on file  Occupational History  . Not on file  Tobacco Use  . Smoking status: Never Smoker  . Smokeless tobacco: Never Used  Substance and Sexual Activity  . Alcohol use: No  . Drug use: No  . Sexual activity: Not on file  Other Topics Concern  . Not on file  Social History Narrative  . Not on file   Social Determinants of Health   Financial Resource Strain:   . Difficulty of Paying Living Expenses: Not on file  Food Insecurity:   . Worried About Programme researcher, broadcasting/film/video in  the Last Year: Not on file  . Ran Out of Food in the Last Year: Not on file  Transportation Needs:   . Lack of Transportation (Medical): Not on file  . Lack of Transportation (Non-Medical): Not on file  Physical Activity:   . Days of Exercise per Week: Not on file  . Minutes of Exercise per Session: Not on file  Stress:   . Feeling of Stress : Not on file  Social Connections:   . Frequency of Communication with Friends and Family: Not on file  . Frequency of Social Gatherings with Friends and  Family: Not on file  . Attends Religious Services: Not on file  . Active Member of Clubs or Organizations: Not on file  . Attends Archivist Meetings: Not on file  . Marital Status: Not on file    Vitals:   08/19/19 0801  BP: 118/70  Pulse: 78  Resp: 12  Temp: (!) 96.8 F (36 C)  SpO2: 99%   Body mass index is 34.77 kg/m.  Physical Exam  Constitutional: She is oriented to person, place, and time. She appears well-developed. She does not appear ill. No distress.  HENT:  Head: Atraumatic.  Right Ear: External ear and ear canal normal. Tympanic membrane is not bulging.  Left Ear: External ear and ear canal normal. Tympanic membrane is not bulging. A middle ear effusion is present.  Nose: Septal deviation present. No rhinorrhea. Right sinus exhibits maxillary sinus tenderness and frontal sinus tenderness. Left sinus exhibits maxillary sinus tenderness and frontal sinus tenderness.  Mouth/Throat: Oropharynx is clear and moist and mucous membranes are normal.  Hypertrophic turbinates. Post nasal drainage. Normal transilluminations of sinuses.  Eyes: Conjunctivae are normal.  Cardiovascular: Normal rate and regular rhythm.  No murmur heard. Respiratory: Effort normal and breath sounds normal. No respiratory distress.  Lymphadenopathy:       Head (right side): No submandibular adenopathy present.       Head (left side): No submandibular adenopathy present.    She has no cervical adenopathy.  Neurological: She is alert and oriented to person, place, and time. She has normal strength. No cranial nerve deficit. Gait normal.  Skin: Skin is warm. No rash noted. No erythema.  Psychiatric: Her mood appears anxious.  Well groomed, good eye contact.    ASSESSMENT AND PLAN:  [name] was seen today for follow-up.  Diagnoses and all orders for this visit:  Subacute sinusitis, unspecified location Completed Augmentin treatment, which did not help. Problem seems to be  getting worse. Avelox 400 mg daily x7 days recommended. We discussed some side effects of antibiotics and the complications of frequent antibiotic use, including C. difficile.  A daily probiotic may help to prevent diarrhea. Pending sinus CT. Instructed about warning signs.  -     moxifloxacin (AVELOX) 400 MG tablet; Take 1 tablet (400 mg total) by mouth daily for 7 days.  Dysfunction of both eustachian tubes Educated about Dx. Plain Mucinex may help. Auto inflation maneuvers several times during the day will also help.  Allergic rhinitis, unspecified seasonality, unspecified trigger This problem could be a contributing factor. Continue Flonase nasal spray. Saline nasal irrigations as needed. For now I am not recommending any antihistaminic.   Return if symptoms worsen or fail to improve.    Rekha Hobbins G. [place], MD  [facility]. [place] office.

## 2019-09-08 DIAGNOSIS — Z1152 Encounter for screening for COVID-19: Secondary | ICD-10-CM | POA: Diagnosis not present

## 2019-09-10 ENCOUNTER — Other Ambulatory Visit: Payer: BC Managed Care – PPO

## 2019-09-29 DIAGNOSIS — F349 Persistent mood [affective] disorder, unspecified: Secondary | ICD-10-CM | POA: Diagnosis not present

## 2019-09-29 DIAGNOSIS — F902 Attention-deficit hyperactivity disorder, combined type: Secondary | ICD-10-CM | POA: Diagnosis not present

## 2019-11-05 DIAGNOSIS — Z79899 Other long term (current) drug therapy: Secondary | ICD-10-CM | POA: Diagnosis not present

## 2019-11-10 DIAGNOSIS — F902 Attention-deficit hyperactivity disorder, combined type: Secondary | ICD-10-CM | POA: Diagnosis not present

## 2019-11-20 DIAGNOSIS — J309 Allergic rhinitis, unspecified: Secondary | ICD-10-CM | POA: Diagnosis not present

## 2020-01-12 DIAGNOSIS — F349 Persistent mood [affective] disorder, unspecified: Secondary | ICD-10-CM | POA: Diagnosis not present

## 2020-01-12 DIAGNOSIS — F902 Attention-deficit hyperactivity disorder, combined type: Secondary | ICD-10-CM | POA: Diagnosis not present

## 2020-01-29 DIAGNOSIS — F902 Attention-deficit hyperactivity disorder, combined type: Secondary | ICD-10-CM | POA: Diagnosis not present

## 2020-01-29 DIAGNOSIS — F349 Persistent mood [affective] disorder, unspecified: Secondary | ICD-10-CM | POA: Diagnosis not present

## 2020-02-12 DIAGNOSIS — F902 Attention-deficit hyperactivity disorder, combined type: Secondary | ICD-10-CM | POA: Diagnosis not present

## 2020-02-12 DIAGNOSIS — F349 Persistent mood [affective] disorder, unspecified: Secondary | ICD-10-CM | POA: Diagnosis not present

## 2020-02-16 DIAGNOSIS — F349 Persistent mood [affective] disorder, unspecified: Secondary | ICD-10-CM | POA: Diagnosis not present

## 2020-02-16 DIAGNOSIS — F902 Attention-deficit hyperactivity disorder, combined type: Secondary | ICD-10-CM | POA: Diagnosis not present

## 2020-03-08 ENCOUNTER — Telehealth: Payer: Self-pay | Admitting: *Deleted

## 2020-03-08 ENCOUNTER — Encounter (HOSPITAL_COMMUNITY): Payer: Self-pay | Admitting: Emergency Medicine

## 2020-03-08 ENCOUNTER — Emergency Department (HOSPITAL_COMMUNITY)
Admission: EM | Admit: 2020-03-08 | Discharge: 2020-03-08 | Disposition: A | Discharge status: Home / Self Care | Payer: BC Managed Care – PPO | Attending: Emergency Medicine | Admitting: Emergency Medicine

## 2020-03-08 ENCOUNTER — Other Ambulatory Visit: Payer: Self-pay

## 2020-03-08 ENCOUNTER — Emergency Department (HOSPITAL_COMMUNITY): Payer: BC Managed Care – PPO

## 2020-03-08 DIAGNOSIS — N281 Cyst of kidney, acquired: Secondary | ICD-10-CM | POA: Diagnosis not present

## 2020-03-08 DIAGNOSIS — N200 Calculus of kidney: Secondary | ICD-10-CM | POA: Diagnosis not present

## 2020-03-08 DIAGNOSIS — N83201 Unspecified ovarian cyst, right side: Secondary | ICD-10-CM

## 2020-03-08 DIAGNOSIS — M419 Scoliosis, unspecified: Secondary | ICD-10-CM | POA: Diagnosis not present

## 2020-03-08 DIAGNOSIS — N83291 Other ovarian cyst, right side: Secondary | ICD-10-CM | POA: Insufficient documentation

## 2020-03-08 DIAGNOSIS — R1011 Right upper quadrant pain: Secondary | ICD-10-CM | POA: Diagnosis not present

## 2020-03-08 DIAGNOSIS — F902 Attention-deficit hyperactivity disorder, combined type: Secondary | ICD-10-CM | POA: Diagnosis not present

## 2020-03-08 LAB — CBC
HCT: 42.6 % (ref 36.0–46.0)
Hemoglobin: 14 g/dL (ref 12.0–15.0)
MCH: 30.4 pg (ref 26.0–34.0)
MCHC: 32.9 g/dL (ref 30.0–36.0)
MCV: 92.6 fL (ref 80.0–100.0)
Platelets: 377 10*3/uL (ref 150–400)
RBC: 4.6 MIL/uL (ref 3.87–5.11)
RDW: 12.8 % (ref 11.5–15.5)
WBC: 10.5 10*3/uL (ref 4.0–10.5)
nRBC: 0 % (ref 0.0–0.2)

## 2020-03-08 LAB — I-STAT BETA HCG BLOOD, ED (MC, WL, AP ONLY): I-stat hCG, quantitative: <5 m[IU]/mL (ref <5)

## 2020-03-08 LAB — COMPREHENSIVE METABOLIC PANEL
ALT: 17 U/L (ref 0–44)
AST: 15 U/L (ref 15–41)
Albumin: 4.2 g/dL (ref 3.5–5.0)
Alkaline Phosphatase: 31 U/L — ABNORMAL LOW (ref 38–126)
Anion gap: 11 (ref 5–15)
BUN: <5 mg/dL — ABNORMAL LOW (ref 6–20)
CO2: 24 mmol/L (ref 22–32)
Calcium: 8.9 mg/dL (ref 8.9–10.3)
Chloride: 104 mmol/L (ref 98–111)
Creatinine, Ser: 0.87 mg/dL (ref 0.44–1.00)
GFR calc Af Amer: >60 mL/min (ref >60)
GFR calc non Af Amer: >60 mL/min (ref >60)
Glucose, Bld: 91 mg/dL (ref 70–99)
Potassium: 3.7 mmol/L (ref 3.5–5.1)
Sodium: 139 mmol/L (ref 135–145)
Total Bilirubin: 0.7 mg/dL (ref 0.3–1.2)
Total Protein: 6.8 g/dL (ref 6.5–8.1)

## 2020-03-08 LAB — URINALYSIS, ROUTINE W REFLEX MICROSCOPIC
Bilirubin Urine: NEGATIVE
Glucose, UA: NEGATIVE mg/dL
Hgb urine dipstick: NEGATIVE
Ketones, ur: NEGATIVE mg/dL
Leukocytes,Ua: NEGATIVE
Nitrite: NEGATIVE
Protein, ur: NEGATIVE mg/dL
Specific Gravity, Urine: 1.013 (ref 1.005–1.030)
pH: 5 (ref 5.0–8.0)

## 2020-03-08 LAB — LIPASE, BLOOD: Lipase: 25 U/L (ref 11–51)

## 2020-03-08 MED ORDER — MORPHINE SULFATE (PF) 4 MG/ML IV SOLN
4.0000 mg | Freq: Once | INTRAVENOUS | Status: AC
  Administered 2020-03-08: 4 mg via INTRAVENOUS
  Filled 2020-03-08: qty 1

## 2020-03-08 MED ORDER — IOHEXOL 300 MG/ML  SOLN
100.0000 mL | Freq: Once | INTRAMUSCULAR | Status: AC | PRN
  Administered 2020-03-08: 100 mL via INTRAVENOUS

## 2020-03-08 MED ORDER — ONDANSETRON HCL 4 MG/2ML IJ SOLN
4.0000 mg | Freq: Once | INTRAMUSCULAR | Status: AC
  Administered 2020-03-08: 4 mg via INTRAVENOUS
  Filled 2020-03-08: qty 2

## 2020-03-08 MED ORDER — SODIUM CHLORIDE 0.9% FLUSH: 3.0000 mL | Freq: Once | INTRAVENOUS | Status: DC

## 2020-03-08 NOTE — Telephone Encounter (Signed)
Patient called nurse triage line on 03/08/2020 at 12:42:47 PM. Caller has a sharp pain on her side that flares from rib cage to hip and sometimes down to her right leg. She tried taking Tylenol. She has had pain for about a week. She has is constantly but it sometimes flares worse. When it flares it takes her breath away. It feels better when she sits but when she stands and walks it flares. She is short of breath. Patient was advised on 03/08/2020 at 12:47:39 PM Go to ED Now by Fransisco Hertz, RN, [place]  Patient went to ED as advised.

## 2020-03-08 NOTE — Discharge Instructions (Signed)
Your ultrasound today shows that you have a 6.5 cm ovarian cyst, this can cause some intermittent torsion, but your OB/GYN Dr. Tenny Craw did not see signs of swelling around her ovary to suggest that this is happening persistently.  You can use ibuprofen and Tylenol as needed at home for pain, you can also apply heat and pressure to the area but if you have severe pain that is not improving you should call your OB/GYN or return to the emergency department.  Dr. Tenny Craw would like for you to call her tomorrow to schedule close follow-up appointment in the office.

## 2020-03-08 NOTE — ED Triage Notes (Signed)
Pt here sent by PCP to r/o appendicitis, sharp pain right lower abd since last night radiating to her leg.

## 2020-03-08 NOTE — ED Provider Notes (Signed)
[facility] EMERGENCY DEPARTMENT Provider Note   CSN: [id number] Arrival date & time: 03/08/20  1501     History Chief Complaint  Patient presents with  . Abdominal Pain    [name] is a 29 y.o. female.  [name] is a 29 y.o. female with a history of HPV, DUB, ADD, depression and anxiety, who presents for evaluation of right lower quadrant pain.  Patient states that about a week ago she began having pain on her right side, primarily located in the right lower quadrant.  She reports the pain is a constant dull ache but intermittently pain mobile, significantly worse, described as a sharp severe pain.  She reports that last functional pain got so bad that she was pulled up in the fetal position.  She states initially this sharp pain seems to resolve but the pain never completely goes away.  She states that last night she was eating some salsa and then shortly after the pain got worse and did not know if this was contributing to it.  She states when the pain becomes severe it gives her chills and makes her nauseated, she has not vomited, reports normal bowel movements.  Denies any dysuria or urinary frequency.  Denies vaginal bleeding or discharge.  She called her PCPs office who recommended that she come to the emergency department for further evaluation.  No previous abdominal surgeries.  No medications given prior to arrival.        Past Medical History:  Diagnosis Date  . ADD (attention deficit disorder with hyperactivity)   . ADHD   . Anxiety    per patient-treated by [facility].  . Depression   . DUB (dysfunctional uterine bleeding)   . Eczema   . HPV (human papilloma virus) infection   . Pneumonia     Patient Active Problem List   Diagnosis Date Noted  . VENOUS INSUFFICIENCY, MILD 07/22/2009  . DYSMENORRHEA 03/03/2008  . ECZEMA, ATOPIC 07/15/2007    Past Surgical History:  Procedure Laterality Date  . TOE SURGERY       OB History   No  obstetric history on file.     Family History  Problem Relation Age of Onset  . Cancer Other        breast  . ADD / ADHD Other     Social History   Tobacco Use  . Smoking status: Never Smoker  . Smokeless tobacco: Never Used  Substance Use Topics  . Alcohol use: No  . Drug use: No    Home Medications Prior to Admission medications   Medication Sig Start Date End Date Taking? Authorizing Provider  ADDERALL XR 20 MG 24 hr capsule  05/08/17   [provider]  clonazePAM (KLONOPIN) 0.5 MG tablet  05/08/17   [provider]  fluticasone (FLONASE) 50 MCG/ACT nasal spray Place 1 spray into both nostrils 2 (two) times daily. 08/11/19   [place], Betty G, MD  ibuprofen (ADVIL,MOTRIN) 600 MG tablet Take 1 tablet (600 mg total) by mouth every 6 (six) hours as needed. 05/29/17   Horton, Mayer Masker, MD  levonorgestrel (MIRENA) 20 MCG/24HR IUD 1 each by Intrauterine route once.    [provider]  traZODone (DESYREL) 150 MG tablet Take 150 mg by mouth at bedtime.    [provider]    Allergies    Patient has no known allergies.  Review of Systems   Review of Systems  Constitutional: Negative for chills and  fever.  HENT: Negative.   Respiratory: Negative for cough and shortness of breath.   Cardiovascular: Negative for chest pain.  Gastrointestinal: Positive for abdominal pain and nausea. Negative for constipation, diarrhea and vomiting.  Genitourinary: Negative for dysuria, frequency, vaginal bleeding and vaginal discharge.  Musculoskeletal: Negative for arthralgias and myalgias.  Skin: Negative for color change and rash.  Neurological: Negative for dizziness, syncope and light-headedness.  All other systems reviewed and are negative.   Physical Exam Updated Vital Signs BP 103/67   Pulse (!) 54   Temp 98.6 F (37 C) (Oral)   Resp 16   Ht 5\' 7"  (1.702 m)   Wt 97.5 kg   SpO2 99%   BMI 33.67 kg/m   Physical Exam Vitals and nursing note  reviewed.  Constitutional:      General: She is not in acute distress.    Appearance: She is well-developed. She is not diaphoretic.     Comments: Well-appearing and in no distress  HENT:     Head: Normocephalic and atraumatic.     Mouth/Throat:     Mouth: Mucous membranes are moist.     Pharynx: Oropharynx is clear.  Eyes:     General:        Right eye: No discharge.        Left eye: No discharge.     Pupils: Pupils are equal, round, and reactive to light.  Cardiovascular:     Rate and Rhythm: Normal rate and regular rhythm.     Heart sounds: Normal heart sounds. No murmur heard.  No friction rub. No gallop.   Pulmonary:     Effort: Pulmonary effort is normal. No respiratory distress.     Breath sounds: Normal breath sounds. No wheezing or rales.     Comments: Respirations equal and unlabored, patient able to speak in full sentences, lungs clear to auscultation bilaterally Abdominal:     General: Bowel sounds are normal. There is no distension.     Palpations: Abdomen is soft. There is no mass.     Tenderness: There is abdominal tenderness in the right lower quadrant. There is no guarding.     Comments: Abdomen is soft, nondistended, bowel sounds present throughout, there is focal tenderness in the right lower quadrant, without guarding or peritoneal signs, no CVA tenderness  Musculoskeletal:        General: No deformity.     Cervical back: Neck supple.  Skin:    General: Skin is warm and dry.     Capillary Refill: Capillary refill takes less than 2 seconds.  Neurological:     Mental Status: She is alert.     Coordination: Coordination normal.     Comments: Speech is clear, able to follow commands Moves extremities without ataxia, coordination intac  Psychiatric:        Mood and Affect: Mood normal.        Behavior: Behavior normal.     ED Results / Procedures / Treatments   Labs (all labs ordered are listed, but only abnormal results are displayed) Labs Reviewed    COMPREHENSIVE METABOLIC PANEL - Abnormal; Notable for the following components:      Result Value   BUN <5 (*)    Alkaline Phosphatase 31 (*)    All other components within normal limits  URINALYSIS, ROUTINE W REFLEX MICROSCOPIC - Abnormal; Notable for the following components:   APPearance HAZY (*)    All other components within normal limits  LIPASE, BLOOD  CBC  I-STAT BETA HCG BLOOD, ED ([facility], [facility], [facility] ONLY)    EKG None  Radiology CT Abdomen Pelvis W Contrast  Result Date: 03/08/2020 CLINICAL DATA:  29 year old female with right lower quadrant abdominal pain. EXAM: CT ABDOMEN AND PELVIS WITH CONTRAST TECHNIQUE: Multidetector CT imaging of the abdomen and pelvis was performed using the standard protocol following bolus administration of intravenous contrast. CONTRAST:  100mL OMNIPAQUE IOHEXOL 300 MG/ML  SOLN COMPARISON:  None. FINDINGS: Lower chest: The visualized lung bases are clear. No intra-abdominal free air. Small free fluid in the pelvis Hepatobiliary: No focal liver abnormality is seen. No gallstones, gallbladder wall thickening, or biliary dilatation. Pancreas: Unremarkable. No pancreatic ductal dilatation or surrounding inflammatory changes. Spleen: Normal in size without focal abnormality. Adrenals/Urinary Tract: The adrenal glands unremarkable. There is a 1 cm right renal upper pole cyst. There is a 5 mm nonobstructing stone in the inferior pole of the right kidney. There is no hydronephrosis on either side. The visualized ureters and urinary bladder appear unremarkable. Stomach/Bowel: There is no bowel obstruction or active inflammation. The appendix is normal. Vascular/Lymphatic: The abdominal aorta and IVC unremarkable. No portal venous gas. Small scattered mesenteric lymph nodes. No adenopathy. Reproductive: The uterus is anteverted. An intrauterine device is noted. There is a 6.5 cm right ovarian cyst. Other: None Musculoskeletal: Scoliosis. No acute osseous pathology. IMPRESSION:  1. A 6.5 cm right ovarian cyst. Ultrasound may provide better evaluation. 2. No bowel obstruction. Normal appendix. 3. A 5 mm nonobstructing right renal inferior pole stone. No hydronephrosis. Electronically Signed   By: Elgie CollardArash  Radparvar M.D.   On: 03/08/2020 17:41   US PELVIC COMPLETE W TRANSVAGINAL AND TORSION R/O  Addendum Date: 03/08/2020   ADDENDUM REPORT: 03/08/2020 19:55 ADDENDUM: These results were called by telephone at the time of interpretation on 03/08/2020 at 7:55 pm to [facility] , who verbally acknowledged these results. Electronically Signed   By: Helyn NumbersAshesh  Parikh MD   On: 03/08/2020 19:55   Result Date: 03/08/2020 CLINICAL DATA:  Right lower quadrant abdominal pain x2 weeks EXAM: TRANSABDOMINAL AND TRANSVAGINAL ULTRASOUND OF PELVIS DOPPLER ULTRASOUND OF OVARIES TECHNIQUE: Both transabdominal and transvaginal ultrasound examinations of the pelvis were performed. Transabdominal technique was performed for global imaging of the pelvis including uterus, ovaries, adnexal regions, and pelvic cul-de-sac. It was necessary to proceed with endovaginal exam following the transabdominal exam to visualize the ovaries bilaterally. Color and duplex Doppler ultrasound was utilized to evaluate blood flow to the ovaries. COMPARISON:  None. FINDINGS: Uterus Measurements: 8.3 x 3.3 x 4.3 cm = volume: 62 mL. No fibroids or other mass visualized. Endometrium Thickness: 7 mm. An intrauterine device is in expected position within the endometrial cavity Right ovary Measurements: 7.1 x 4.6 x 4.8 cm = volume: 83 mL. The right ovary is largely replaced by a simple, unilocular cyst measuring 6.4 x 4.0 x 4.7 cm. Additionally, the right ovary appears displaced into the cul-de-sac. Left ovary Measurements: 2.8 x 1.6 x 2.0 cm = volume: 5 mL. Normal appearance/no adnexal mass. Multiple follicles are seen within the left ovary Pulsed Doppler evaluation of both ovaries demonstrates a normal low resistance arterial waveform  involving the right ovary, however, a true venous waveform is not clearly identified. Normal low resistance arterial and venous waveforms are identified involving the left ovary. Other findings Trace simple appearing free fluid is seen within the cul-de-sac. IMPRESSION: 6.4 cm simple unilocular cyst within the right ovary possibly representing a follicular cyst. The right ovary appears displaced  into the cul-de-sac and a true low resistance venous waveform is not clearly identified, findings which, together, could reflect changes of partial torsion of the right ovary. Gynecologic consultation is recommended for further management. Electronically Signed: By: Helyn Numbers MD On: 03/08/2020 19:49    Procedures Procedures (including critical care time)  Medications Ordered in ED Medications  sodium chloride flush (NS) 0.9 % injection 3 mL (has no administration in time range)  morphine 4 MG/ML injection 4 mg (has no administration in time range)  iohexol (OMNIPAQUE) 300 MG/ML solution 100 mL (100 mLs Intravenous Contrast Given 03/08/20 1720)  ondansetron (ZOFRAN) injection 4 mg (4 mg Intravenous Given 03/08/20 2113)    ED Course  I have reviewed the triage vital signs and the nursing notes.  Pertinent labs & imaging results that were available during my care of the patient were reviewed by me and considered in my medical decision making (see chart for details).    MDM Rules/Calculators/A&P                         29 year old female presents with 1 week of intermittent right lower quadrant abdominal pain, reports constant dull ache with intermittent sharp severe pains associated with nausea.  On arrival patient is well-appearing and in no acute distress she is afebrile, vitals otherwise normal.  She does have some focal right lower quadrant tenderness on exam but without guarding or peritoneal signs.  Based on patient's complaint, from triage labs and CT abdomen pelvis were ordered. I have  independently ordered, reviewed and interpreted all labs and imaging: CBC: No leukocytosis, normal hemoglobin CMP: No acute electrolyte derangements, normal renal and liver function Lipase: WNL UA: No evidence of infection Pregnancy: Negative  CT abdomen pelvis with a 6.5 cm right ovarian cyst noted, recommend ultrasound for better evaluation, normal appendix noted, 5 mm nonobstructing right renal stone without hydronephrosis.  I suspect 6.5 cm cyst is likely contributing to patient's pain, I reviewed these results, patient was still in the waiting room and ordered pelvic ultrasound to rule out torsion.  Received call from radiology regarding the results of pelvic ultrasound, 6.5 cm cyst with some displacement of the ovary into the cul-de-sac which is atypical, on Doppler, there is partial loss of venous waveform, but arterial waveform is still intact, this is concerning for potential partial torsion, will discuss with OB/GYN.  Case discussed with patient's OB/GYN Dr. Tenny Craw who reviewed patient's images, did not see any edema over the ovary and so felt that torsion was much less likely.  Discussed observation versus at home follow-up and Dr. Tenny Craw states the patient can be discharged home with close outpatient follow-up in the office.  I discussed this with the patient who is currently not experiencing severe pain and looks very comfortable, and she is in agreement with this plan.  I discussed strict return precautions with her, encouraged her to use heat, ibuprofen or Tylenol as needed for pain.  At this time she is stable for discharge home.  Final Clinical Impression(s) / ED Diagnoses Final diagnoses:  Right ovarian cyst    Rx / DC Orders ED Discharge Orders    None       Legrand Rams 03/09/20 1006    Tilden Fossa, MD 03/09/20 1351

## 2020-03-08 NOTE — Telephone Encounter (Signed)
Noted  

## 2020-03-09 ENCOUNTER — Ambulatory Visit: Payer: BC Managed Care – PPO | Admitting: Family Medicine

## 2020-03-09 ENCOUNTER — Other Ambulatory Visit: Payer: Self-pay | Admitting: Obstetrics and Gynecology

## 2020-03-09 ENCOUNTER — Encounter (HOSPITAL_BASED_OUTPATIENT_CLINIC_OR_DEPARTMENT_OTHER): Payer: Self-pay | Admitting: Obstetrics and Gynecology

## 2020-03-09 ENCOUNTER — Other Ambulatory Visit (HOSPITAL_COMMUNITY)
Admission: RE | Admit: 2020-03-09 | Discharge: 2020-03-09 | Disposition: A | Discharge status: Home / Self Care | Payer: BC Managed Care – PPO | Source: Ambulatory Visit | Attending: Obstetrics and Gynecology | Admitting: Obstetrics and Gynecology

## 2020-03-09 ENCOUNTER — Other Ambulatory Visit: Payer: Self-pay

## 2020-03-09 DIAGNOSIS — Z20822 Contact with and (suspected) exposure to covid-19: Secondary | ICD-10-CM | POA: Diagnosis not present

## 2020-03-09 DIAGNOSIS — Z01812 Encounter for preprocedural laboratory examination: Secondary | ICD-10-CM | POA: Diagnosis not present

## 2020-03-09 DIAGNOSIS — N83209 Unspecified ovarian cyst, unspecified side: Secondary | ICD-10-CM | POA: Diagnosis not present

## 2020-03-09 LAB — SARS CORONAVIRUS 2 (TAT 6-24 HRS): SARS Coronavirus 2: NEGATIVE

## 2020-03-09 NOTE — Progress Notes (Signed)
Spoke w/ via phone for pre-op interview---PT Lab needs dos----   ASK DR HORVATH IF NEED CBC AND URINE PREG HAS LABS DONE 03-08-2020 EPIC            Lab results------  Has labs done 03-08-2020 in epic I stat hcg, [place], cbc, cmet, lipase             COVID test ------03-09-2020 1500 PM Arrive at -------1030 AM 03-10-2020 PER DR HORVATH NPO after MN NO Solid Food.  Clear liquids from MN until---930 AM THEN NPO Medications to take morning of surgery -----CLONAZEPAM PRN Diabetic medication -----N/A Patient Special Instructions -----NONE Pre-Op special Istructions -----NONE Patient verbalized understanding of instructions that were given at this phone interview. Patient denies shortness of breath, chest pain, fever, cough at this phone interview.

## 2020-03-10 ENCOUNTER — Ambulatory Visit (HOSPITAL_BASED_OUTPATIENT_CLINIC_OR_DEPARTMENT_OTHER): Payer: BC Managed Care – PPO | Admitting: Anesthesiology

## 2020-03-10 ENCOUNTER — Encounter (HOSPITAL_BASED_OUTPATIENT_CLINIC_OR_DEPARTMENT_OTHER)
Admission: RE | Disposition: A | Discharge status: Home / Self Care | Payer: Self-pay | Source: Home / Self Care | Attending: Obstetrics and Gynecology

## 2020-03-10 ENCOUNTER — Other Ambulatory Visit: Payer: Self-pay

## 2020-03-10 ENCOUNTER — Encounter (HOSPITAL_BASED_OUTPATIENT_CLINIC_OR_DEPARTMENT_OTHER): Payer: Self-pay | Admitting: Obstetrics and Gynecology

## 2020-03-10 ENCOUNTER — Ambulatory Visit (HOSPITAL_BASED_OUTPATIENT_CLINIC_OR_DEPARTMENT_OTHER)
Admission: RE | Admit: 2020-03-10 | Discharge: 2020-03-10 | Disposition: A | Discharge status: Home / Self Care | Payer: BC Managed Care – PPO | Attending: Obstetrics and Gynecology | Admitting: Obstetrics and Gynecology

## 2020-03-10 DIAGNOSIS — N83201 Unspecified ovarian cyst, right side: Secondary | ICD-10-CM | POA: Diagnosis not present

## 2020-03-10 DIAGNOSIS — Z79899 Other long term (current) drug therapy: Secondary | ICD-10-CM | POA: Diagnosis not present

## 2020-03-10 DIAGNOSIS — F329 Major depressive disorder, single episode, unspecified: Secondary | ICD-10-CM | POA: Diagnosis not present

## 2020-03-10 DIAGNOSIS — F909 Attention-deficit hyperactivity disorder, unspecified type: Secondary | ICD-10-CM | POA: Diagnosis not present

## 2020-03-10 DIAGNOSIS — N8301 Follicular cyst of right ovary: Secondary | ICD-10-CM | POA: Insufficient documentation

## 2020-03-10 DIAGNOSIS — N803 Endometriosis of pelvic peritoneum: Secondary | ICD-10-CM | POA: Diagnosis not present

## 2020-03-10 DIAGNOSIS — F418 Other specified anxiety disorders: Secondary | ICD-10-CM | POA: Diagnosis not present

## 2020-03-10 DIAGNOSIS — F419 Anxiety disorder, unspecified: Secondary | ICD-10-CM | POA: Insufficient documentation

## 2020-03-10 HISTORY — PX: LAPAROSCOPIC OVARIAN CYSTECTOMY: SHX6248

## 2020-03-10 HISTORY — PX: LAPAROSCOPY: SHX197

## 2020-03-10 LAB — POCT PREGNANCY, URINE: Preg Test, Ur: NEGATIVE

## 2020-03-10 SURGERY — LAPAROSCOPY OPERATIVE
Anesthesia: General | Site: Abdomen | Laterality: Right

## 2020-03-10 MED ORDER — ACETAMINOPHEN 500 MG PO TABS
ORAL_TABLET | ORAL | Status: AC
  Filled 2020-03-10: qty 2

## 2020-03-10 MED ORDER — FENTANYL CITRATE (PF) 100 MCG/2ML IJ SOLN
INTRAMUSCULAR | Status: AC
  Filled 2020-03-10: qty 2

## 2020-03-10 MED ORDER — DEXMEDETOMIDINE (PRECEDEX) IN NS 20 MCG/5ML (4 MCG/ML) IV SYRINGE
PREFILLED_SYRINGE | INTRAVENOUS | Status: AC
  Filled 2020-03-10: qty 5

## 2020-03-10 MED ORDER — AMISULPRIDE (ANTIEMETIC) 5 MG/2ML IV SOLN
INTRAVENOUS | Status: AC
  Filled 2020-03-10: qty 4

## 2020-03-10 MED ORDER — ONDANSETRON HCL 4 MG/2ML IJ SOLN
INTRAMUSCULAR | Status: DC | PRN
  Administered 2020-03-10: 4 mg via INTRAVENOUS

## 2020-03-10 MED ORDER — LACTATED RINGERS IV SOLN: INTRAVENOUS | Status: DC

## 2020-03-10 MED ORDER — GABAPENTIN 300 MG PO CAPS
300.0000 mg | ORAL_CAPSULE | ORAL | Status: AC
  Administered 2020-03-10: 300 mg via ORAL

## 2020-03-10 MED ORDER — HYDROCODONE-ACETAMINOPHEN 5-325 MG PO TABS
1.0000 | ORAL_TABLET | Freq: Once | ORAL | Status: AC
  Administered 2020-03-10: 1 via ORAL

## 2020-03-10 MED ORDER — HYDROCODONE-ACETAMINOPHEN 5-325 MG PO TABS
ORAL_TABLET | ORAL | Status: AC
  Filled 2020-03-10: qty 1

## 2020-03-10 MED ORDER — DEXAMETHASONE SODIUM PHOSPHATE 10 MG/ML IJ SOLN
INTRAMUSCULAR | Status: AC
  Filled 2020-03-10: qty 1

## 2020-03-10 MED ORDER — MIDAZOLAM HCL 2 MG/2ML IJ SOLN
INTRAMUSCULAR | Status: DC | PRN
  Administered 2020-03-10: 2 mg via INTRAVENOUS

## 2020-03-10 MED ORDER — ONDANSETRON HCL 4 MG/2ML IJ SOLN
INTRAMUSCULAR | Status: AC
  Filled 2020-03-10: qty 2

## 2020-03-10 MED ORDER — SODIUM CHLORIDE (PF) 0.9 % IJ SOLN
INTRAMUSCULAR | Status: DC | PRN
  Administered 2020-03-10: 30 mL

## 2020-03-10 MED ORDER — HYDROMORPHONE HCL 1 MG/ML IJ SOLN
INTRAMUSCULAR | Status: AC
  Filled 2020-03-10: qty 1

## 2020-03-10 MED ORDER — PROPOFOL 10 MG/ML IV BOLUS
INTRAVENOUS | Status: AC
  Filled 2020-03-10: qty 20

## 2020-03-10 MED ORDER — AMISULPRIDE (ANTIEMETIC) 5 MG/2ML IV SOLN
10.0000 mg | Freq: Once | INTRAVENOUS | Status: AC
  Administered 2020-03-10: 10 mg via INTRAVENOUS

## 2020-03-10 MED ORDER — ACETAMINOPHEN 500 MG PO TABS
1000.0000 mg | ORAL_TABLET | ORAL | Status: AC
  Administered 2020-03-10: 1000 mg via ORAL

## 2020-03-10 MED ORDER — HYDROMORPHONE HCL 1 MG/ML IJ SOLN
0.2500 mg | INTRAMUSCULAR | Status: DC | PRN
  Administered 2020-03-10 (×2): 0.25 mg via INTRAVENOUS
  Administered 2020-03-10 (×3): 0.5 mg via INTRAVENOUS

## 2020-03-10 MED ORDER — HYDROCODONE-ACETAMINOPHEN 5-325 MG PO TABS: 1.0000 | ORAL_TABLET | Freq: Four times a day (QID) | ORAL | 0 refills | Status: DC | PRN

## 2020-03-10 MED ORDER — FENTANYL CITRATE (PF) 100 MCG/2ML IJ SOLN
INTRAMUSCULAR | Status: DC | PRN
  Administered 2020-03-10 (×3): 50 ug via INTRAVENOUS
  Administered 2020-03-10: 25 ug via INTRAVENOUS
  Administered 2020-03-10 (×2): 50 ug via INTRAVENOUS
  Administered 2020-03-10: 25 ug via INTRAVENOUS

## 2020-03-10 MED ORDER — ROCURONIUM BROMIDE 10 MG/ML (PF) SYRINGE
PREFILLED_SYRINGE | INTRAVENOUS | Status: DC | PRN
  Administered 2020-03-10: 5 mg via INTRAVENOUS
  Administered 2020-03-10: 50 mg via INTRAVENOUS

## 2020-03-10 MED ORDER — SODIUM CHLORIDE 0.9 % IR SOLN
Status: DC | PRN
  Administered 2020-03-10: 100 mL

## 2020-03-10 MED ORDER — POVIDONE-IODINE 10 % EX SWAB: 2.0000 "application " | Freq: Once | CUTANEOUS | Status: DC

## 2020-03-10 MED ORDER — GLYCOPYRROLATE PF 0.2 MG/ML IJ SOSY
PREFILLED_SYRINGE | INTRAMUSCULAR | Status: AC
  Filled 2020-03-10: qty 1

## 2020-03-10 MED ORDER — SUGAMMADEX SODIUM 200 MG/2ML IV SOLN
INTRAVENOUS | Status: DC | PRN
  Administered 2020-03-10: 200 mg via INTRAVENOUS

## 2020-03-10 MED ORDER — DEXAMETHASONE SODIUM PHOSPHATE 10 MG/ML IJ SOLN
INTRAMUSCULAR | Status: DC | PRN
  Administered 2020-03-10: 5 mg via INTRAVENOUS

## 2020-03-10 MED ORDER — DEXMEDETOMIDINE (PRECEDEX) IN NS 20 MCG/5ML (4 MCG/ML) IV SYRINGE
PREFILLED_SYRINGE | INTRAVENOUS | Status: DC | PRN
  Administered 2020-03-10 (×3): 4 ug via INTRAVENOUS

## 2020-03-10 MED ORDER — GABAPENTIN 300 MG PO CAPS
ORAL_CAPSULE | ORAL | Status: AC
  Filled 2020-03-10: qty 1

## 2020-03-10 MED ORDER — SOD CITRATE-CITRIC ACID 500-334 MG/5ML PO SOLN: 30.0000 mL | ORAL | Status: DC

## 2020-03-10 MED ORDER — GLYCOPYRROLATE PF 0.2 MG/ML IJ SOSY
PREFILLED_SYRINGE | INTRAMUSCULAR | Status: DC | PRN
Start: 2020-03-10 — End: 2020-03-10
  Administered 2020-03-10: .2 mg via INTRAVENOUS

## 2020-03-10 MED ORDER — PROPOFOL 10 MG/ML IV BOLUS
INTRAVENOUS | Status: DC | PRN
  Administered 2020-03-10: 150 mg via INTRAVENOUS

## 2020-03-10 MED ORDER — ROPIVACAINE HCL 5 MG/ML IJ SOLN
INTRAMUSCULAR | Status: DC | PRN
  Administered 2020-03-10: 30 mL

## 2020-03-10 MED ORDER — CELECOXIB 200 MG PO CAPS
400.0000 mg | ORAL_CAPSULE | ORAL | Status: AC
  Administered 2020-03-10: 400 mg via ORAL

## 2020-03-10 MED ORDER — CELECOXIB 200 MG PO CAPS
ORAL_CAPSULE | ORAL | Status: AC
  Filled 2020-03-10: qty 2

## 2020-03-10 MED ORDER — MIDAZOLAM HCL 2 MG/2ML IJ SOLN
INTRAMUSCULAR | Status: AC
  Filled 2020-03-10: qty 2

## 2020-03-10 MED ORDER — ROCURONIUM BROMIDE 10 MG/ML (PF) SYRINGE
PREFILLED_SYRINGE | INTRAVENOUS | Status: AC
  Filled 2020-03-10: qty 10

## 2020-03-10 MED ORDER — LIDOCAINE 2% (20 MG/ML) 5 ML SYRINGE
INTRAMUSCULAR | Status: DC | PRN
  Administered 2020-03-10: 60 mg via INTRAVENOUS

## 2020-03-10 MED ORDER — LIDOCAINE 2% (20 MG/ML) 5 ML SYRINGE
INTRAMUSCULAR | Status: AC
  Filled 2020-03-10: qty 5

## 2020-03-10 SURGICAL SUPPLY — 46 items
ADH SKN CLS APL DERMABOND .7 (GAUZE/BANDAGES/DRESSINGS) ×2
APL SRG 38 LTWT LNG FL B (MISCELLANEOUS) ×2
APPLICATOR ARISTA FLEXITIP XL (MISCELLANEOUS) ×3 IMPLANT
BAG SPEC RTRVL LRG 6X4 10 (ENDOMECHANICALS)
CABLE HIGH FREQUENCY MONO STRZ (ELECTRODE) IMPLANT
CANISTER SUCT 3000ML PPV (MISCELLANEOUS) ×3 IMPLANT
CATH ROBINSON RED A/P 16FR (CATHETERS) ×3 IMPLANT
COVER MAYO STAND STRL (DRAPES) ×3 IMPLANT
COVER WAND RF STERILE (DRAPES) ×3 IMPLANT
DERMABOND ADVANCED (GAUZE/BANDAGES/DRESSINGS) ×1
DERMABOND ADVANCED .7 DNX12 (GAUZE/BANDAGES/DRESSINGS) ×2 IMPLANT
DRSG COVADERM PLUS 2X2 (GAUZE/BANDAGES/DRESSINGS) ×3 IMPLANT
DRSG OPSITE POSTOP 3X4 (GAUZE/BANDAGES/DRESSINGS) ×3 IMPLANT
DURAPREP 26ML APPLICATOR (WOUND CARE) ×3 IMPLANT
GAUZE 4X4 16PLY RFD (DISPOSABLE) ×3 IMPLANT
GLOVE BIO SURGEON STRL SZ7 (GLOVE) ×3 IMPLANT
GOWN STRL REUS W/TWL LRG LVL3 (GOWN DISPOSABLE) ×3 IMPLANT
HEMOSTAT ARISTA ABSORB 3G PWDR (HEMOSTASIS) ×3 IMPLANT
KIT TURNOVER CYSTO (KITS) ×3 IMPLANT
LIGASURE VESSEL 5MM BLUNT TIP (ELECTROSURGICAL) ×3 IMPLANT
NEEDLE HYPO 22GX1.5 SAFETY (NEEDLE) IMPLANT
NEEDLE INSUFFLATION 120MM (ENDOMECHANICALS) ×3 IMPLANT
NEEDLE INSUFFLATION 14GA 150MM (NEEDLE) IMPLANT
NS IRRIG 500ML POUR BTL (IV SOLUTION) ×3 IMPLANT
PACK LAPAROSCOPY BASIN (CUSTOM PROCEDURE TRAY) ×3 IMPLANT
PACK TRENDGUARD 450 HYBRID PRO (MISCELLANEOUS) ×2 IMPLANT
PAD OB MATERNITY 4.3X12.25 (PERSONAL CARE ITEMS) ×3 IMPLANT
POUCH SPECIMEN RETRIEVAL 10MM (ENDOMECHANICALS) IMPLANT
SCISSORS LAP 5X35 DISP (ENDOMECHANICALS) ×3 IMPLANT
SEALER TISSUE G2 CVD JAW 35 (ENDOMECHANICALS) IMPLANT
SEALER TISSUE G2 CVD JAW 45CM (ENDOMECHANICALS)
SET SUCTION IRRIG HYDROSURG (IRRIGATION / IRRIGATOR) ×3 IMPLANT
SET TUBE SMOKE EVAC HIGH FLOW (TUBING) ×3 IMPLANT
SOLUTION ELECTROLUBE (MISCELLANEOUS) IMPLANT
SUT VICRYL 0 UR6 27IN ABS (SUTURE) ×3 IMPLANT
SUT VICRYL RAPIDE 3 0 (SUTURE) IMPLANT
SUT VICRYL RAPIDE 4/0 PS 2 (SUTURE) ×6 IMPLANT
SYR 50ML LL SCALE MARK (SYRINGE) ×3 IMPLANT
TOWEL OR 17X26 10 PK STRL BLUE (TOWEL DISPOSABLE) ×3 IMPLANT
TRAY FOLEY W/BAG SLVR 14FR (SET/KITS/TRAYS/PACK) IMPLANT
TRENDGUARD 450 HYBRID PRO PACK (MISCELLANEOUS) ×3
TROCAR DILATING TIP 12MM 150MM (ENDOMECHANICALS) IMPLANT
TROCAR OPTI TIP 5M 100M (ENDOMECHANICALS) ×6 IMPLANT
TROCAR XCEL DIL TIP R 11M (ENDOMECHANICALS) ×3 IMPLANT
WARMER LAPAROSCOPE (MISCELLANEOUS) ×3 IMPLANT
WATER STERILE IRR 500ML POUR (IV SOLUTION) ×3 IMPLANT

## 2020-03-10 NOTE — Transfer of Care (Signed)
Immediate Anesthesia Transfer of Care Note  Patient: Melannie A Suggs  Procedure(s) Performed: Procedure(s) (LRB): LAPAROSCOPY OPERATIVE AND CAUTERY OF ENDOMETRIOSIS (Right) LAPAROSCOPIC OVARIAN CYSTECTOMY (Right)  Patient Location: PACU  Anesthesia Type: General  Level of Consciousness: awake, oriented, sedated and patient cooperative  Airway & Oxygen Therapy: Patient Spontanous Breathing and Patient connected to face mask oxygen  Post-op Assessment: Report given to PACU RN and Post -op Vital signs reviewed and stable  Post vital signs: Reviewed and stable  Complications: No apparent anesthesia complications  Last Vitals:  Vitals Value Taken Time  BP 117/75 03/10/20 1300  Temp 36.7 C 03/10/20 1256  Pulse 83 03/10/20 1304  Resp 16 03/10/20 1304  SpO2 100 % 03/10/20 1304  Vitals shown include unvalidated device data.  Last Pain:  Vitals:   03/10/20 1052  TempSrc: Oral  PainSc: 7

## 2020-03-10 NOTE — Anesthesia Procedure Notes (Signed)
Procedure Name: Intubation Date/Time: 03/10/2020 11:50 AM Performed by: Suan Halter, CRNA Pre-anesthesia Checklist: Patient identified, Emergency Drugs available, Suction available and Patient being monitored Patient Re-evaluated:Patient Re-evaluated prior to induction Oxygen Delivery Method: Circle system utilized Preoxygenation: Pre-oxygenation with 100% oxygen Induction Type: IV induction Ventilation: Mask ventilation without difficulty Laryngoscope Size: Mac and 3 Grade View: Grade I Tube type: Oral Tube size: 7.0 mm Number of attempts: 1 Airway Equipment and Method: Stylet and Oral airway Placement Confirmation: ETT inserted through vocal cords under direct vision,  positive ETCO2 and breath sounds checked- equal and bilateral Secured at: 21 cm Tube secured with: Tape Dental Injury: Teeth and Oropharynx as per pre-operative assessment

## 2020-03-10 NOTE — Progress Notes (Signed)
There has been no change in the patients history, status or exam since the history and physical.  Vitals:   03/09/20 1552 03/10/20 1052  BP:  104/65  Pulse:  61  Resp:  15  Temp:  98.9 F (37.2 C)  TempSrc:  Oral  SpO2:  100%  Weight: 99.8 kg 101 kg  Height: 5\' 7"  (1.702 m) 5\' 7"  (1.702 m)    Results for orders placed or performed during the hospital encounter of 03/09/20 (from the past 72 hour(s))  SARS CORONAVIRUS 2 (TAT 6-24 HRS) Nasopharyngeal Nasopharyngeal Swab     Status: None   Collection Time: 03/09/20  2:54 PM   Specimen: Nasopharyngeal Swab  Result Value Ref Range   SARS Coronavirus 2 NEGATIVE NEGATIVE    Comment: (NOTE) SARS-CoV-2 target nucleic acids are NOT DETECTED.  The SARS-CoV-2 RNA is generally detectable in upper and lower respiratory specimens during the acute phase of infection. Negative results do not preclude SARS-CoV-2 infection, do not rule out co-infections with other pathogens, and should not be used as the sole basis for treatment or other patient management decisions. Negative results must be combined with clinical observations, patient history, and epidemiological information. The expected result is Negative.  Fact Sheet for Patients: 05/09/20  Fact Sheet for Healthcare Providers: 05/09/20  This test is not yet approved or cleared by the [employer] FDA and  has been authorized for detection and/or diagnosis of SARS-CoV-2 by FDA under an Emergency Use Authorization (EUA). This EUA will remain  in effect (meaning this test can be used) for the duration of the COVID-19 declaration under Se ction 564(b)(1) of the Act, 21 U.S.C. section 360bbb-3(b)(1), unless the authorization is terminated or revoked sooner.  Performed at [facility], [address], [employer], [address] [place]     [place]

## 2020-03-10 NOTE — Discharge Instructions (Signed)
DISCHARGE INSTRUCTIONS: Laparoscopy  The following instructions have been prepared to help you care for yourself upon your return home today.  Wound care: Marland Kitchen Do not get the incision wet for the first 24 hours. The incision should be kept clean and dry. . The Band-Aids or dressings may be removed the day after surgery. . Should the incision become sore, red, and swollen after the first week, check with your doctor.  Personal hygiene: . Shower the day after your procedure.  Activity and limitations: . Do NOT drive or operate any equipment today. . Do NOT lift anything more than 15 pounds for 2-3 weeks after surgery. . Do NOT rest in bed all day. . Walking is encouraged. Walk each day, starting slowly with 5-minute walks 3 or 4 times a day. Slowly increase the length of your walks. . Walk up and down stairs slowly. . Do NOT do strenuous activities, such as golfing, playing tennis, bowling, running, biking, weight lifting, gardening, mowing, or vacuuming for 2-4 weeks. Ask your doctor when it is okay to start.  Diet: Eat a light meal as desired this evening. You may resume your usual diet tomorrow.  Return to work: This is dependent on the type of work you do. For the most part you can return to a desk job within a week of surgery. If you are more active at work, please discuss this with your doctor.  What to expect after your surgery: You may have a slight burning sensation when you urinate on the first day. You may have a very small amount of blood in the urine. Expect to have a small amount of vaginal discharge/light bleeding for 1-2 weeks. It is not unusual to have abdominal soreness and bruising for up to 2 weeks. You may be tired and need more rest for about 1 week. You may experience shoulder pain for 24-72 hours. Lying flat in bed may relieve it.  Call your doctor for any of the following: . Develop a fever of 100.4 or greater . Inability to urinate 6 hours after discharge from  hospital . Severe pain not relieved by pain medications . Persistent of heavy bleeding at incision site . Redness or swelling around incision site after a week . Increasing nausea or vomiting       Post Anesthesia Home Care Instructions  Activity: Get plenty of rest for the remainder of the day. A responsible individual must stay with you for 24 hours following the procedure.  For the next 24 hours, DO NOT: -Drive a car -Advertising copywriter -Drink alcoholic beverages -Take any medication unless instructed by your physician -Make any legal decisions or sign important papers.  Meals: Start with liquid foods such as gelatin or soup. Progress to regular foods as tolerated. Avoid greasy, spicy, heavy foods. If nausea and/or vomiting occur, drink only clear liquids until the nausea and/or vomiting subsides. Call your physician if vomiting continues.  Special Instructions/Symptoms: Your throat may feel dry or sore from the anesthesia or the breathing tube placed in your throat during surgery. If this causes discomfort, gargle with warm salt water. The discomfort should disappear within 24 hours.    Do not take any Tylenol until after 4:45 pm today. Marland Kitchen

## 2020-03-10 NOTE — Anesthesia Postprocedure Evaluation (Signed)
Anesthesia Post Note  Patient: [name]  Procedure(s) Performed: LAPAROSCOPY OPERATIVE AND CAUTERY OF ENDOMETRIOSIS (Right Abdomen) LAPAROSCOPIC OVARIAN CYSTECTOMY (Right Abdomen)     Patient location during evaluation: PACU Anesthesia Type: General Level of consciousness: awake and alert Pain management: pain level controlled Vital Signs Assessment: post-procedure vital signs reviewed and stable Respiratory status: spontaneous breathing, nonlabored ventilation and respiratory function stable Cardiovascular status: blood pressure returned to baseline and stable Postop Assessment: no apparent nausea or vomiting Anesthetic complications: no   No complications documented.  Last Vitals:  Vitals:   03/10/20 1330 03/10/20 1345  BP:    Pulse: 71 72  Resp: 10 13  Temp:    SpO2: 100% 98%             Journiee Feldkamp,W. EDMOND

## 2020-03-10 NOTE — Anesthesia Preprocedure Evaluation (Addendum)
Anesthesia Evaluation  Patient identified by MRN, date of birth, ID band Patient awake    Reviewed: Allergy & Precautions, H&P , NPO status , Patient's Chart, lab work & pertinent test results  Airway Mallampati: III  TM Distance: >3 FB Neck ROM: Full    Dental no notable dental hx. (+) Teeth Intact, Dental Advisory Given   Pulmonary neg pulmonary ROS,    Pulmonary exam normal breath sounds clear to auscultation       Cardiovascular negative cardio ROS   Rhythm:Regular Rate:Normal     Neuro/Psych Anxiety Depression negative neurological ROS  negative psych ROS   GI/Hepatic negative GI ROS, Neg liver ROS,   Endo/Other  negative endocrine ROS  Renal/GU negative Renal ROS  negative genitourinary   Musculoskeletal   Abdominal   Peds  Hematology negative hematology ROS (+)   Anesthesia Other Findings   Reproductive/Obstetrics negative OB ROS                            Anesthesia Physical Anesthesia Plan  ASA: II  Anesthesia Plan: General   Post-op Pain Management:    Induction: Intravenous  PONV Risk Score and Plan: 4 or greater and Ondansetron, Dexamethasone and Midazolam  Airway Management Planned: Oral ETT  Additional Equipment:   Intra-op Plan:   Post-operative Plan: Extubation in OR  Informed Consent: I have reviewed the patients History and Physical, chart, labs and discussed the procedure including the risks, benefits and alternatives for the proposed anesthesia with the patient or authorized representative who has indicated his/her understanding and acceptance.     Dental advisory given  Plan Discussed with: CRNA  Anesthesia Plan Comments:         Anesthesia Quick Evaluation

## 2020-03-10 NOTE — H&P (Signed)
29 y.o.  G0. complains of RLQ pain worsened over last week and R ovarian cyst that could be intermittently torsing.  Previously:"Pt seen last night for pain and dx with 6 cm cyst and uncertain if venous flow seen. Today pain is substantial still but [place] is same and venous flow is confirmed. Pt has been bothered for last six months although has not had [place] before this. This week has been hard for her to walk. Work makes worse. No nausea, vomiting. BM normal. No fever. Low risk for STDs.   Pt G0, in process of adopting neice. Not sure about having kids of own. "  Past Medical History:  Diagnosis Date  . ADD (attention deficit disorder with hyperactivity)   . ADHD   . Anxiety    per patient-treated by [facility].  . Depression   . Eczema    as child  . HPV (human papilloma virus) infection   . Pneumonia 2016   Past Surgical History:  Procedure Laterality Date  . TOE SURGERY Bilateral 2005   2 nd toes both sides    Social History   Socioeconomic History  . Marital status: Single    Spouse name: Not on file  . Number of children: Not on file  . Years of education: Not on file  . Highest education level: Not on file  Occupational History  . Not on file  Tobacco Use  . Smoking status: Never Smoker  . Smokeless tobacco: Never Used  Vaping Use  . Vaping Use: Never used  Substance and Sexual Activity  . Alcohol use: No  . Drug use: No  . Sexual activity: Not on file  Other Topics Concern  . Not on file  Social History Narrative  . Not on file   Social Determinants of Health   Financial Resource Strain:   . Difficulty of Paying Living Expenses:   Food Insecurity:   . Worried About Programme researcher, broadcasting/film/video in the Last Year:   . Barista in the Last Year:   Transportation Needs:   . Freight forwarder (Medical):   Marland Kitchen Lack of Transportation (Non-Medical):   Physical Activity:   . Days of Exercise per Week:   . Minutes of Exercise per Session:   Stress:   . Feeling  of Stress :   Social Connections:   . Frequency of Communication with Friends and Family:   . Frequency of Social Gatherings with Friends and Family:   . Attends Religious Services:   . Active Member of Clubs or Organizations:   . Attends Banker Meetings:   Marland Kitchen Marital Status:   Intimate Partner Violence:   . Fear of Current or Ex-Partner:   . Emotionally Abused:   Marland Kitchen Physically Abused:   . Sexually Abused:     No current facility-administered medications on file prior to encounter.   Current Outpatient Medications on File Prior to Encounter  Medication Sig Dispense Refill  . brexpiprazole (REXULTI) 1 MG TABS tablet Take by mouth at bedtime.    Marland Kitchen ibuprofen (ADVIL) 800 MG tablet Take 800 mg by mouth every 8 (eight) hours as needed.    . ADDERALL XR 20 MG 24 hr capsule   0  . clonazePAM (KLONOPIN) 0.5 MG tablet 2 (two) times daily as needed.   1  . fluticasone (FLONASE) 50 MCG/ACT nasal spray Place 1 spray into both nostrils 2 (two) times daily. (Patient taking differently: Place 1 spray into both nostrils as  needed. ) 16 g 3  . levonorgestrel (MIRENA) 20 MCG/24HR IUD 1 each by Intrauterine route once. Inserted 2017    . traZODone (DESYREL) 150 MG tablet Take 150 mg by mouth at bedtime.      No Known Allergies  Vitals:   03/09/20 1552  Weight: 99.8 kg  Height: 5\' 7"  (1.702 m)    Lungs: clear to ascultation Cor:  RRR Abdomen:  soft, nontender, nondistended. Ex:  no cords, erythema Pelvic:   Vulva: no masses, no atrophy, no lesions Vagina: no tenderness, no erythema, no abnormal vaginal discharge, no vesicle(s) or ulcers, no cystocele, no rectocele Cervix: grossly normal, no discharge, no cervical motion tenderness; IUD strings seen Uterus: normal size, normal shape, midline, no uterine prolapse, mobile, tender Bladder/Urethra: normal meatus, no urethral discharge, no urethral mass, bladder non distended, Urethra well supported Adnexa/Parametria: no parametrial  tenderness, no parametrial mass, no ovarian mass, adnexal tenderness; pain preventing feeling cul de sac mass  8x4x3; EM normal. Simple appearing ovarian cyst, 6 cm, normal flow seen.  A:  RO cyst about 6 cm with worsening pain.  [place] at [facility] showed was in cul de sac- possible that cyst is now in position where it is stretched or intermittently torsed.  Pt offered expectant management vs surgery - pt c/o worsening pain and desires definitive.  Plan on laparoscopy with R ovarian cystectomy to relieve pedicles.  Pt understands and is ok if oopherectomy or salpingectomy is necessary for any reason during surgery.  Pt does not have plans for pregnancy now or in future but we did discuss the ramifications of the possible surgeries on future fertility- she desires to proceed.    P: P: All risks, benefits and alternatives d/w patient and she desires to proceed.  Patient has undergone ERAS protocol and will receive SCDs during the operation.     [facility]

## 2020-03-10 NOTE — Op Note (Signed)
03/10/2020  1:09 PM  PATIENT:  [name]  29 y.o. female  PRE-OPERATIVE DIAGNOSIS:  possible ovarian torsion  POST-OPERATIVE DIAGNOSIS:  possible ovarian torsion  PROCEDURE:  Procedure(s) with comments: LAPAROSCOPY OPERATIVE AND CAUTERY OF ENDOMETRIOSIS (Right) LAPAROSCOPIC OVARIAN CYSTECTOMY (Right) - POSSIBLE  SURGEON:  Surgeon(s) and Role:    Carrington Clamp, MD - Primary  ANESTHESIA:   general  EBL:  50 mL   LOCAL MEDICATIONS USED:  OTHER ropivicaine, arista  SPECIMEN:  Source of Specimen:  ovarian cyst  DISPOSITION OF SPECIMEN:  PATHOLOGY  COUNTS:  YES  TOURNIQUET:  * No tourniquets in log *  DICTATION: .Note written in EPIC  PLAN OF CARE: Discharge to home after PACU  PATIENT DISPOSITION:  PACU - hemodynamically stable.   Delay start of Pharmacological VTE agent (>24hrs) due to surgical blood loss or risk of bleeding: not applicable         Note Details Complications: none  Medications:  None  Findings:Normal uterus.  IUD strings felt after procedure.  8 cm cyst on R in cul de sac with clear fluid in cyst.  Two areas of endometriosis on the R pelvis in cul de sac and below the ureter.  Possible lesion on L but too close to ureter to ablate.    Technique: After adequate general anesthesia was achieved, the patient was prepped and draped in the usual fashion.  A hulka was placed in the cervix and the bladder drained..  A two cm incision was made above the umbilicus and the veress needle passed inside without aspiration of bowel contents or blood.  The 12 mm trocar was placed without comp after the abdomin had been insufflated.  The camera was placed inside and a 5 mm trocar placed 3 above the right and left iliac crests under direct visualization of the camera.  A careful and systematic exploration was performed and the above findings were noted.   The R ovarian cyst was moved from the cul de sac and ruptured during the removal.  The  ligasure was used to remove half of the thinnest portion of the cyst and was removed through the 5 mm trocar site at the end of the procedure.  The edges of the cyst were cauterized with both the ligasure and the hot scissors.   The endometriotic areas were identified and once the ureters were seen out of the way, were cauterized.   Arista was applied to the ovary, which appeared hemostatic but wanted to be sure no small vessels would open later.  The instruments were removed from the abdomen and the abdomen desufflated.  The 12 mm trocar fascia was closed with a figure of eight stitch and the skin incisions closed with subq 3-0vicryl R.  The incisions were closed with dermabond.  The patient tolerated the procedure well and was returned to the recovery room in stable condition.    Jaceon Heiberger A

## 2020-03-10 NOTE — Brief Op Note (Signed)
03/10/2020  1:09 PM  PATIENT:  [name]  29 y.o. female  PRE-OPERATIVE DIAGNOSIS:  possible ovarian torsion  POST-OPERATIVE DIAGNOSIS:  possible ovarian torsion  PROCEDURE:  Procedure(s) with comments: LAPAROSCOPY OPERATIVE AND CAUTERY OF ENDOMETRIOSIS (Right) LAPAROSCOPIC OVARIAN CYSTECTOMY (Right) - POSSIBLE  SURGEON:  Surgeon(s) and Role:    Carrington Clamp, MD - Primary  ANESTHESIA:   general  EBL:  50 mL   LOCAL MEDICATIONS USED:  OTHER ropivicaine, arista  SPECIMEN:  Source of Specimen:  ovarian cyst  DISPOSITION OF SPECIMEN:  PATHOLOGY  COUNTS:  YES  TOURNIQUET:  * No tourniquets in log *  DICTATION: .Note written in EPIC  PLAN OF CARE: Discharge to home after PACU  PATIENT DISPOSITION:  PACU - hemodynamically stable.   Delay start of Pharmacological VTE agent (>24hrs) due to surgical blood loss or risk of bleeding: not applicable

## 2020-03-11 ENCOUNTER — Encounter (HOSPITAL_BASED_OUTPATIENT_CLINIC_OR_DEPARTMENT_OTHER): Payer: Self-pay | Admitting: Obstetrics and Gynecology

## 2020-03-11 LAB — SURGICAL PATHOLOGY

## 2020-03-16 DIAGNOSIS — R509 Fever, unspecified: Secondary | ICD-10-CM | POA: Diagnosis not present

## 2020-03-16 DIAGNOSIS — Z20828 Contact with and (suspected) exposure to other viral communicable diseases: Secondary | ICD-10-CM | POA: Diagnosis not present

## 2020-03-16 DIAGNOSIS — R0981 Nasal congestion: Secondary | ICD-10-CM | POA: Diagnosis not present

## 2020-03-19 ENCOUNTER — Other Ambulatory Visit: Payer: Self-pay | Admitting: Adult Health

## 2020-03-19 ENCOUNTER — Encounter: Payer: Self-pay | Admitting: Adult Health

## 2020-03-19 ENCOUNTER — Ambulatory Visit (HOSPITAL_COMMUNITY)
Admission: RE | Admit: 2020-03-19 | Discharge: 2020-03-19 | Disposition: A | Discharge status: Home / Self Care | Payer: BC Managed Care – PPO | Source: Ambulatory Visit | Attending: Pulmonary Disease | Admitting: Pulmonary Disease

## 2020-03-19 DIAGNOSIS — U071 COVID-19: Secondary | ICD-10-CM

## 2020-03-19 MED ORDER — SODIUM CHLORIDE 0.9 % IV SOLN
1200.0000 mg | Freq: Once | INTRAVENOUS | Status: AC
  Administered 2020-03-19: 1200 mg via INTRAVENOUS
  Filled 2020-03-19: qty 10

## 2020-03-19 MED ORDER — ALBUTEROL SULFATE HFA 108 (90 BASE) MCG/ACT IN AERS: 2.0000 | INHALATION_SPRAY | Freq: Once | RESPIRATORY_TRACT | Status: DC | PRN

## 2020-03-19 MED ORDER — METHYLPREDNISOLONE SODIUM SUCC 125 MG IJ SOLR: 125.0000 mg | Freq: Once | INTRAMUSCULAR | Status: DC | PRN

## 2020-03-19 MED ORDER — EPINEPHRINE 0.3 MG/0.3ML IJ SOAJ: 0.3000 mg | Freq: Once | INTRAMUSCULAR | Status: DC | PRN

## 2020-03-19 MED ORDER — FAMOTIDINE IN NACL 20-0.9 MG/50ML-% IV SOLN: 20.0000 mg | Freq: Once | INTRAVENOUS | Status: DC | PRN

## 2020-03-19 MED ORDER — SODIUM CHLORIDE 0.9 % IV SOLN: INTRAVENOUS | Status: DC | PRN

## 2020-03-19 MED ORDER — DIPHENHYDRAMINE HCL 50 MG/ML IJ SOLN: 50.0000 mg | Freq: Once | INTRAMUSCULAR | Status: DC | PRN

## 2020-03-19 NOTE — Progress Notes (Signed)
I connected by phone with [name] on 03/19/2020 at 10:25 AM to discuss the potential use of a new treatment for mild to moderate COVID-19 viral infection in non-hospitalized patients.  This patient is a 29 y.o. female that meets the FDA criteria for Emergency Use Authorization of COVID monoclonal antibody casirivimab/imdevimab.  Has a (+) direct SARS-CoV-2 viral test result  Has mild or moderate COVID-19   Is NOT hospitalized due to COVID-19  Is within 10 days of symptom onset  Has at least one of the high risk factor(s) for progression to severe COVID-19 and/or hospitalization as defined in EUA.  Specific high risk criteria : BMI > 25   I have spoken and communicated the following to the patient or parent/caregiver regarding COVID monoclonal antibody treatment:  1. FDA has authorized the emergency use for the treatment of mild to moderate COVID-19 in adults and pediatric patients with positive results of direct SARS-CoV-2 viral testing who are 49 years of age and older weighing at least 40 kg, and who are at high risk for progressing to severe COVID-19 and/or hospitalization.  2. The significant known and potential risks and benefits of COVID monoclonal antibody, and the extent to which such potential risks and benefits are unknown.  3. Information on available alternative treatments and the risks and benefits of those alternatives, including clinical trials.  4. Patients treated with COVID monoclonal antibody should continue to self-isolate and use infection control measures (e.g., wear mask, isolate, social distance, avoid sharing personal items, clean and disinfect "high touch" surfaces, and frequent handwashing) according to CDC guidelines.   5. The patient or parent/caregiver has the option to accept or refuse COVID monoclonal antibody treatment.  After reviewing this information with the patient, The patient agreed to proceed with receiving casirivimab\imdevimab infusion and  will be provided a copy of the Fact sheet prior to receiving the infusion. [name] 03/19/2020 10:25 AM

## 2020-03-19 NOTE — Discharge Instructions (Signed)

## 2020-04-20 DIAGNOSIS — F902 Attention-deficit hyperactivity disorder, combined type: Secondary | ICD-10-CM | POA: Diagnosis not present

## 2020-04-20 DIAGNOSIS — F349 Persistent mood [affective] disorder, unspecified: Secondary | ICD-10-CM | POA: Diagnosis not present

## 2020-04-29 DIAGNOSIS — Z01419 Encounter for gynecological examination (general) (routine) without abnormal findings: Secondary | ICD-10-CM | POA: Diagnosis not present

## 2020-04-29 DIAGNOSIS — Z124 Encounter for screening for malignant neoplasm of cervix: Secondary | ICD-10-CM | POA: Diagnosis not present

## 2020-04-29 DIAGNOSIS — Z113 Encounter for screening for infections with a predominantly sexual mode of transmission: Secondary | ICD-10-CM | POA: Diagnosis not present

## 2020-04-29 LAB — HM PAP SMEAR

## 2020-05-03 ENCOUNTER — Encounter: Payer: Self-pay | Admitting: *Deleted

## 2020-05-10 DIAGNOSIS — F349 Persistent mood [affective] disorder, unspecified: Secondary | ICD-10-CM | POA: Diagnosis not present

## 2020-05-10 DIAGNOSIS — F902 Attention-deficit hyperactivity disorder, combined type: Secondary | ICD-10-CM | POA: Diagnosis not present

## 2020-05-26 DIAGNOSIS — M791 Myalgia, unspecified site: Secondary | ICD-10-CM | POA: Diagnosis not present

## 2020-05-26 DIAGNOSIS — M9908 Segmental and somatic dysfunction of rib cage: Secondary | ICD-10-CM | POA: Diagnosis not present

## 2020-05-26 DIAGNOSIS — M9902 Segmental and somatic dysfunction of thoracic region: Secondary | ICD-10-CM | POA: Diagnosis not present

## 2020-06-01 DIAGNOSIS — M9908 Segmental and somatic dysfunction of rib cage: Secondary | ICD-10-CM | POA: Diagnosis not present

## 2020-06-01 DIAGNOSIS — M9902 Segmental and somatic dysfunction of thoracic region: Secondary | ICD-10-CM | POA: Diagnosis not present

## 2020-06-01 DIAGNOSIS — M791 Myalgia, unspecified site: Secondary | ICD-10-CM | POA: Diagnosis not present

## 2020-06-07 DIAGNOSIS — F349 Persistent mood [affective] disorder, unspecified: Secondary | ICD-10-CM | POA: Diagnosis not present

## 2020-06-07 DIAGNOSIS — F902 Attention-deficit hyperactivity disorder, combined type: Secondary | ICD-10-CM | POA: Diagnosis not present

## 2020-06-11 DIAGNOSIS — N809 Endometriosis, unspecified: Secondary | ICD-10-CM | POA: Diagnosis not present

## 2020-06-11 DIAGNOSIS — Z6833 Body mass index (BMI) 33.0-33.9, adult: Secondary | ICD-10-CM | POA: Diagnosis not present

## 2020-06-15 ENCOUNTER — Ambulatory Visit (INDEPENDENT_AMBULATORY_CARE_PROVIDER_SITE_OTHER): Payer: BC Managed Care – PPO | Admitting: Internal Medicine

## 2020-06-15 ENCOUNTER — Encounter: Payer: Self-pay | Admitting: Internal Medicine

## 2020-06-15 ENCOUNTER — Other Ambulatory Visit: Payer: Self-pay

## 2020-06-15 VITALS — BP 100/68 | HR 74 | Temp 98.8°F | Ht 67.0 in | Wt 223.8 lb

## 2020-06-15 DIAGNOSIS — F339 Major depressive disorder, recurrent, unspecified: Secondary | ICD-10-CM

## 2020-06-15 DIAGNOSIS — E669 Obesity, unspecified: Secondary | ICD-10-CM | POA: Diagnosis not present

## 2020-06-15 DIAGNOSIS — F909 Attention-deficit hyperactivity disorder, unspecified type: Secondary | ICD-10-CM | POA: Diagnosis not present

## 2020-06-15 NOTE — Patient Instructions (Signed)
-  Nice seeing you today!!  -Schedule follow up in 3 months for your physical. Please come in fasting that day. 

## 2020-06-15 NOTE — Progress Notes (Signed)
Established Patient Office Visit     This visit occurred during the SARS-CoV-2 public health emergency.  Safety protocols were in place, including screening questions prior to the visit, additional usage of staff PPE, and extensive cleaning of exam room while observing appropriate contact time as indicated for disinfecting solutions.    CC/Reason for Visit: Establish care, discuss chronic medical conditions  HPI: [name] is a 29 y.o. female who is coming in today for the above mentioned reasons. Past Medical History is significant for: Depression and ADHD followed by psychiatry on Adderall, Rexulti, Klonopin, trazodone.  She follows up with them every 3 months.  She also has a therapist that she sees routinely.  She has no known drug allergies, she does not smoke, she does not do drugs, she drinks alcohol occasionally.  Her surgery significant for a recent laparoscopy due to a right ovarian torsion due to a cyst.  She is recently married.  [place].   Past Medical/Surgical History: Past Medical History:  Diagnosis Date  . ADD (attention deficit disorder with hyperactivity)   . ADHD   . Anxiety    per patient-treated by [facility].  . Depression   . Eczema    as child  . HPV (human papilloma virus) infection   . Pneumonia 2016    Past Surgical History:  Procedure Laterality Date  . LAPAROSCOPIC OVARIAN CYSTECTOMY Right 03/10/2020   Procedure: LAPAROSCOPIC OVARIAN CYSTECTOMY;  Surgeon: Carrington Clamp, MD;  Location[facility];  Service: Gynecology;  Laterality: Right;  POSSIBLE  . LAPAROSCOPY Right 03/10/2020   Procedure: LAPAROSCOPY OPERATIVE AND CAUTERY OF ENDOMETRIOSIS;  Surgeon: Carrington Clamp, MD;  Location: [facility];  Service: Gynecology;  Laterality: Right;  . TOE SURGERY Bilateral 2005   2 nd toes both sides    Social History:  reports that she has never smoked. She has never used smokeless tobacco. She  reports that she does not drink alcohol and does not use drugs.  Allergies: No Known Allergies  Family History:  Family History  Problem Relation Age of Onset  . Cancer Other        breast  . ADD / ADHD Other      Current Outpatient Medications:  .  ADDERALL XR 20 MG 24 hr capsule, , Disp: , Rfl: 0 .  brexpiprazole (REXULTI) 1 MG TABS tablet, Take by mouth at bedtime., Disp: , Rfl:  .  clonazePAM (KLONOPIN) 0.5 MG tablet, 2 (two) times daily as needed. , Disp: , Rfl: 1 .  clonazePAM (KLONOPIN) 1 MG tablet, Take 1 mg by mouth 2 (two) times daily as needed., Disp: , Rfl:  .  fluticasone (FLONASE) 50 MCG/ACT nasal spray, Place 1 spray into both nostrils 2 (two) times daily. (Patient taking differently: Place 1 spray into both nostrils as needed. ), Disp: 16 g, Rfl: 3 .  ibuprofen (ADVIL) 800 MG tablet, Take 800 mg by mouth every 8 (eight) hours as needed., Disp: , Rfl:  .  levonorgestrel (MIRENA) 20 MCG/24HR IUD, 1 each by Intrauterine route once. Inserted 2017, Disp: , Rfl:  .  traZODone (DESYREL) 150 MG tablet, Take 150 mg by mouth at bedtime., Disp: , Rfl:   Review of Systems:  Constitutional: Denies fever, chills, diaphoresis, appetite change and fatigue.  HEENT: Denies photophobia, eye pain, redness, hearing loss, ear pain, congestion, sore throat, rhinorrhea, sneezing, mouth sores, trouble swallowing, neck pain, neck stiffness and tinnitus.   Respiratory:  Denies SOB, DOE, cough, chest tightness,  and wheezing.   Cardiovascular: Denies chest pain, palpitations and leg swelling.  Gastrointestinal: Denies nausea, vomiting, abdominal pain, diarrhea, constipation, blood in stool and abdominal distention.  Genitourinary: Denies dysuria, urgency, frequency, hematuria, flank pain and difficulty urinating.  Endocrine: Denies: hot or cold intolerance, sweats, changes in hair or nails, polyuria, polydipsia. Musculoskeletal: Denies myalgias, back pain, joint swelling, arthralgias and gait  problem.  Skin: Denies pallor, rash and wound.  Neurological: Denies dizziness, seizures, syncope, weakness, light-headedness, numbness and headaches.  Hematological: Denies adenopathy. Easy bruising, personal or family bleeding history  Psychiatric/Behavioral: Denies suicidal ideation, mood changes, confusion, nervousness, sleep disturbance and agitation    Physical Exam: Vitals:   06/15/20 0943  BP: 100/68  Pulse: 74  Temp: 98.8 F (37.1 C)  TempSrc: Oral  SpO2: 98%  Weight: 223 lb 12.8 oz (101.5 kg)  Height: 5\' 7"  (1.702 m)    Body mass index is 35.05 kg/m.   Constitutional: NAD, calm, comfortable Eyes: PERRL, lids and conjunctivae normal ENMT: Mucous membranes are moist.  Respiratory: clear to auscultation bilaterally, no wheezing, no crackles. Normal respiratory effort. No accessory muscle use.  Cardiovascular: Regular rate and rhythm, no murmurs / rubs / gallops. No extremity edema. Neurologic: Grossly intact and nonfocal Psychiatric: Normal judgment and insight. Alert and oriented x 3. Normal mood.    Impression and Plan:  Obesity (BMI 35.0-39.9 without comorbidity) -Discussed healthy lifestyle, including increased physical activity and better food choices to promote weight loss.  Depression, recurrent (HCC) Attention deficit hyperactivity disorder (ADHD), unspecified ADHD type -Followed by psychiatry and psychology.  She will schedule follow-up for CPE.  She declines flu vaccine today.    Patient Instructions  -Nice seeing you today!!  -Schedule follow up in 3 months for your physical. Please come in fasting that day.     06-30-1990, MD [facility]

## 2020-06-28 DIAGNOSIS — F349 Persistent mood [affective] disorder, unspecified: Secondary | ICD-10-CM | POA: Diagnosis not present

## 2020-06-28 DIAGNOSIS — F902 Attention-deficit hyperactivity disorder, combined type: Secondary | ICD-10-CM | POA: Diagnosis not present

## 2020-07-21 DIAGNOSIS — F902 Attention-deficit hyperactivity disorder, combined type: Secondary | ICD-10-CM | POA: Diagnosis not present

## 2020-07-21 DIAGNOSIS — F349 Persistent mood [affective] disorder, unspecified: Secondary | ICD-10-CM | POA: Diagnosis not present

## 2020-09-22 ENCOUNTER — Encounter: Payer: Self-pay | Admitting: Internal Medicine

## 2020-09-22 ENCOUNTER — Ambulatory Visit (INDEPENDENT_AMBULATORY_CARE_PROVIDER_SITE_OTHER): Payer: Commercial Managed Care - PPO | Admitting: Internal Medicine

## 2020-09-22 ENCOUNTER — Other Ambulatory Visit: Payer: Self-pay

## 2020-09-22 VITALS — BP 110/80 | HR 74 | Temp 98.6°F | Ht 67.0 in | Wt 230.6 lb

## 2020-09-22 DIAGNOSIS — E669 Obesity, unspecified: Secondary | ICD-10-CM | POA: Diagnosis not present

## 2020-09-22 DIAGNOSIS — Z1231 Encounter for screening mammogram for malignant neoplasm of breast: Secondary | ICD-10-CM | POA: Diagnosis not present

## 2020-09-22 DIAGNOSIS — Z803 Family history of malignant neoplasm of breast: Secondary | ICD-10-CM

## 2020-09-22 DIAGNOSIS — Z23 Encounter for immunization: Secondary | ICD-10-CM

## 2020-09-22 DIAGNOSIS — Z0000 Encounter for general adult medical examination without abnormal findings: Secondary | ICD-10-CM | POA: Diagnosis not present

## 2020-09-22 LAB — COMPREHENSIVE METABOLIC PANEL
ALT: 15 U/L (ref 0–35)
AST: 14 U/L (ref 0–37)
Albumin: 4.4 g/dL (ref 3.5–5.2)
Alkaline Phosphatase: 36 U/L — ABNORMAL LOW (ref 39–117)
BUN: 10 mg/dL (ref 6–23)
CO2: 25 mEq/L (ref 19–32)
Calcium: 9.6 mg/dL (ref 8.4–10.5)
Chloride: 104 mEq/L (ref 96–112)
Creatinine, Ser: 0.88 mg/dL (ref 0.40–1.20)
GFR: 87.24 mL/min (ref >60.00)
Glucose, Bld: 82 mg/dL (ref 70–99)
Potassium: 4.2 mEq/L (ref 3.5–5.1)
Sodium: 136 mEq/L (ref 135–145)
Total Bilirubin: 0.4 mg/dL (ref 0.2–1.2)
Total Protein: 7 g/dL (ref 6.0–8.3)

## 2020-09-22 LAB — CBC WITH DIFFERENTIAL/PLATELET
Basophils Absolute: 0 10*3/uL (ref 0.0–0.1)
Basophils Relative: 0.4 % (ref 0.0–3.0)
Eosinophils Absolute: 0.1 10*3/uL (ref 0.0–0.7)
Eosinophils Relative: 1.6 % (ref 0.0–5.0)
HCT: 42 % (ref 36.0–46.0)
Hemoglobin: 14.3 g/dL (ref 12.0–15.0)
Lymphocytes Relative: 26.9 % (ref 12.0–46.0)
Lymphs Abs: 2.1 10*3/uL (ref 0.7–4.0)
MCHC: 34.2 g/dL (ref 30.0–36.0)
MCV: 86.7 fl (ref 78.0–100.0)
Monocytes Absolute: 0.3 10*3/uL (ref 0.1–1.0)
Monocytes Relative: 4.1 % (ref 3.0–12.0)
Neutro Abs: 5.3 10*3/uL (ref 1.4–7.7)
Neutrophils Relative %: 67 % (ref 43.0–77.0)
Platelets: 400 10*3/uL (ref 150.0–400.0)
RBC: 4.84 Mil/uL (ref 3.87–5.11)
RDW: 13 % (ref 11.5–15.5)
WBC: 8 10*3/uL (ref 4.0–10.5)

## 2020-09-22 LAB — LIPID PANEL
Cholesterol: 164 mg/dL (ref 0–200)
HDL: 47.2 mg/dL (ref >39.00)
LDL Cholesterol: 102 mg/dL — ABNORMAL HIGH (ref 0–99)
NonHDL: 116.36
Total CHOL/HDL Ratio: 3
Triglycerides: 71 mg/dL (ref 0.0–149.0)
VLDL: 14.2 mg/dL (ref 0.0–40.0)

## 2020-09-22 LAB — TSH: TSH: 2.2 u[IU]/mL (ref 0.35–4.50)

## 2020-09-22 NOTE — Addendum Note (Signed)
Addended by: Evert Kohl D on: 09/22/2020 09:53 AM   Modules accepted: Orders

## 2020-09-22 NOTE — Patient Instructions (Signed)
-Nice seeing you today!!  -Lab work today; will notify you once results are available.  -Flu vaccine today.  -Consider getting your COVID vaccines.  -Schedule follow up in 1 year or sooner as needed.   Preventive Care 77-32 Years Old, Female Preventive care refers to lifestyle choices and visits with your health care provider that can promote health and wellness. This includes:  A yearly physical exam. This is also called an annual wellness visit.  Regular dental and eye exams.  Immunizations.  Screening for certain conditions.  Healthy lifestyle choices, such as: ? Eating a healthy diet. ? Getting regular exercise. ? Not using drugs or products that contain nicotine and tobacco. ? Limiting alcohol use. What can I expect for my preventive care visit? Physical exam Your health care provider may check your:  Height and weight. These may be used to calculate your BMI (body mass index). BMI is a measurement that tells if you are at a healthy weight.  Heart rate and blood pressure.  Body temperature.  Skin for abnormal spots. Counseling Your health care provider may ask you questions about your:  Past medical problems.  Family's medical history.  Alcohol, tobacco, and drug use.  Emotional well-being.  Home life and relationship well-being.  Sexual activity.  Diet, exercise, and sleep habits.  Work and work Statistician.  Access to firearms.  Method of birth control.  Menstrual cycle.  Pregnancy history. What immunizations do I need? Vaccines are usually given at various ages, according to a schedule. Your health care provider will recommend vaccines for you based on your age, medical history, and lifestyle or other factors, such as travel or where you work.   What tests do I need? Blood tests  Lipid and cholesterol levels. These may be checked every 5 years starting at age 55.  Hepatitis C test.  Hepatitis B test. Screening  Diabetes screening.  This is done by checking your blood sugar (glucose) after you have not eaten for a while (fasting).  STD (sexually transmitted disease) testing, if you are at risk.  BRCA-related cancer screening. This may be done if you have a family history of breast, ovarian, tubal, or peritoneal cancers.  Pelvic exam and Pap test. This may be done every 3 years starting at age 43. Starting at age 72, this may be done every 5 years if you have a Pap test in combination with an HPV test. Talk with your health care provider about your test results, treatment options, and if necessary, the need for more tests.   Follow these instructions at home: Eating and drinking  Eat a healthy diet that includes fresh fruits and vegetables, whole grains, lean protein, and low-fat dairy products.  Take vitamin and mineral supplements as recommended by your health care provider.  Do not drink alcohol if: ? Your health care provider tells you not to drink. ? You are pregnant, may be pregnant, or are planning to become pregnant.  If you drink alcohol: ? Limit how much you have to 0-1 drink a day. ? Be aware of how much alcohol is in your drink. In the [place] one drink equals one 12 oz bottle of beer (355 mL), one 5 oz glass of wine (148 mL), or one 1 oz glass of hard liquor (44 mL).   Lifestyle  Take daily care of your teeth and gums. Brush your teeth every morning and night with fluoride toothpaste. Floss one time each day.  Stay active. Exercise for at least 30  minutes 5 or more days each week.  Do not use any products that contain nicotine or tobacco, such as cigarettes, e-cigarettes, and chewing tobacco. If you need help quitting, ask your health care provider.  Do not use drugs.  If you are sexually active, practice safe sex. Use a condom or other form of protection to prevent STIs (sexually transmitted infections).  If you do not wish to become pregnant, use a form of birth control. If you plan to become  pregnant, see your health care provider for a prepregnancy visit.  Find healthy ways to cope with stress, such as: ? Meditation, yoga, or listening to music. ? Journaling. ? Talking to a trusted person. ? Spending time with friends and family. Safety  Always wear your seat belt while driving or riding in a vehicle.  Do not drive: ? If you have been drinking alcohol. Do not ride with someone who has been drinking. ? When you are tired or distracted. ? While texting.  Wear a helmet and other protective equipment during sports activities.  If you have firearms in your house, make sure you follow all gun safety procedures.  Seek help if you have been physically or sexually abused. What's next?  Go to your health care provider once a year for an annual wellness visit.  Ask your health care provider how often you should have your eyes and teeth checked.  Stay up to date on all vaccines. This information is not intended to replace advice given to you by your health care provider. Make sure you discuss any questions you have with your health care provider. Document Revised: 03/14/2020 Document Reviewed: 03/28/2018 Elsevier Patient Education  2021 [employer].

## 2020-09-22 NOTE — Addendum Note (Signed)
Addended by: Kern Reap B on: 09/22/2020 10:04 AM   Modules accepted: Orders

## 2020-09-22 NOTE — Progress Notes (Signed)
Established Patient Office Visit     This visit occurred during the SARS-CoV-2 public health emergency.  Safety protocols were in place, including screening questions prior to the visit, additional usage of staff PPE, and extensive cleaning of exam room while observing appropriate contact time as indicated for disinfecting solutions.    CC/Reason for Visit: Annual preventive exam  HPI: [name] is a 30 y.o. female who is coming in today for the above mentioned reasons. Past Medical History is significant for: ADHD and depression followed by psychiatry.  She currently has a BMI of 36.12.  She has concerns about potential thyroid issues due to weight gain and her mother having had hypothyroidism.  She tells me that her paternal grandfather died from breast cancer in her early 42s and she is interested in getting an early mammogram.  She has routine dental care but no eye care.  She is overdue for flu and COVID vaccines.  Tdap is up-to-date.   Past Medical/Surgical History: Past Medical History:  Diagnosis Date  . ADD (attention deficit disorder with hyperactivity)   . ADHD   . Anxiety    per patient-treated by [facility].  . Depression   . Eczema    as child  . HPV (human papilloma virus) infection   . Pneumonia 2016    Past Surgical History:  Procedure Laterality Date  . LAPAROSCOPIC OVARIAN CYSTECTOMY Right 03/10/2020   Procedure: LAPAROSCOPIC OVARIAN CYSTECTOMY;  Surgeon: Bobbye Charleston, MD;  Location: [facility];  Service: Gynecology;  Laterality: Right;  POSSIBLE  . LAPAROSCOPY Right 03/10/2020   Procedure: LAPAROSCOPY OPERATIVE AND CAUTERY OF ENDOMETRIOSIS;  Surgeon: Bobbye Charleston, MD;  Location: [facility];  Service: Gynecology;  Laterality: Right;  . TOE SURGERY Bilateral 2005   2 nd toes both sides    Social History:  reports that she has never smoked. She has never used smokeless tobacco. She reports that she does not  drink alcohol and does not use drugs.  Allergies: No Known Allergies  Family History:  Family History  Problem Relation Age of Onset  . Cancer Other        breast  . ADD / ADHD Other      Current Outpatient Medications:  .  ADDERALL XR 20 MG 24 hr capsule, , Disp: , Rfl: 0 .  brexpiprazole (REXULTI) 1 MG TABS tablet, Take by mouth at bedtime., Disp: , Rfl:  .  clonazePAM (KLONOPIN) 1 MG tablet, Take 1 mg by mouth 2 (two) times daily as needed., Disp: , Rfl:  .  fluticasone (FLONASE) 50 MCG/ACT nasal spray, Place 1 spray into both nostrils 2 (two) times daily. (Patient taking differently: Place 1 spray into both nostrils as needed.), Disp: 16 g, Rfl: 3 .  ibuprofen (ADVIL) 800 MG tablet, Take 800 mg by mouth every 8 (eight) hours as needed., Disp: , Rfl:  .  levonorgestrel (MIRENA) 20 MCG/24HR IUD, 1 each by Intrauterine route once. Inserted 2017, Disp: , Rfl:  .  traZODone (DESYREL) 150 MG tablet, Take 150 mg by mouth at bedtime., Disp: , Rfl:   Review of Systems:  Constitutional: Denies fever, chills, diaphoresis, appetite change and fatigue.  HEENT: Denies photophobia, eye pain, redness, hearing loss, ear pain, congestion, sore throat, rhinorrhea, sneezing, mouth sores, trouble swallowing, neck pain, neck stiffness and tinnitus.   Respiratory: Denies SOB, Russo, cough, chest tightness,  and wheezing.   Cardiovascular: Denies chest pain, palpitations and leg swelling.  Gastrointestinal:  Denies nausea, vomiting, abdominal pain, diarrhea, constipation, blood in stool and abdominal distention.  Genitourinary: Denies dysuria, urgency, frequency, hematuria, flank pain and difficulty urinating.  Endocrine: Denies: hot or cold intolerance, sweats, changes in hair or nails, polyuria, polydipsia. Musculoskeletal: Denies myalgias, back pain, joint swelling, arthralgias and gait problem.  Skin: Denies pallor, rash and wound.  Neurological: Denies dizziness, seizures, syncope, weakness,  light-headedness, numbness and headaches.  Hematological: Denies adenopathy. Easy bruising, personal or family bleeding history  Psychiatric/Behavioral: Denies suicidal ideation, mood changes, confusion, nervousness, sleep disturbance and agitation    Physical Exam: Vitals:   09/22/20 0908  BP: 110/80  Pulse: 74  Temp: 98.6 F (37 C)  TempSrc: Oral  SpO2: 98%  Weight: 230 lb 9.6 oz (104.6 kg)  Height: 5' 7"  (1.702 m)    Body mass index is 36.12 kg/m.   Constitutional: NAD, calm, comfortable Eyes: PERRL, lids and conjunctivae normal ENMT: Mucous membranes are moist. Posterior pharynx clear of any exudate or lesions. Normal dentition. Tympanic membrane is pearly white, no erythema or bulging. Neck: normal, supple, no masses, no thyromegaly Respiratory: clear to auscultation bilaterally, no wheezing, no crackles. Normal respiratory effort. No accessory muscle use.  Cardiovascular: Regular rate and rhythm, no murmurs / rubs / gallops. No extremity edema. 2+ pedal pulses.  Abdomen: no tenderness, no masses palpated. No hepatosplenomegaly. Bowel sounds positive.  Musculoskeletal: no clubbing / cyanosis. No joint deformity upper and lower extremities. Good ROM, no contractures. Normal muscle tone.  Skin: no rashes, lesions, ulcers. No induration Neurologic: CN 2-12 grossly intact. Sensation intact, DTR normal. Strength 5/5 in all 4.  Psychiatric: Normal judgment and insight. Alert and oriented x 3. Normal mood.    Impression and Plan:  Encounter for preventive health examination  -Have advised routine eye and dental care. -Flu vaccine in office today, she will consider obtaining Covid vaccinations at pharmacy, Tdap is up-to-date. -Screening labs today. -Healthy lifestyle discussed in detail. -She would like to pursue an early mammogram to 2 family history of paternal grandfather having died in her early 37s with breast cancer. -Commence routine colon cancer screening age  66. -She follows routinely with GYN, last Pap smear was in September 2021.  Obesity (BMI 35.0-39.9 without comorbidity) -Discussed healthy lifestyle, including increased physical activity and better food choices to promote weight loss.  Need for influenza vaccination -Flu vaccine administered today.   Patient Instructions   -Nice seeing you today!!  -Lab work today; will notify you once results are available.  -Flu vaccine today.  -Consider getting your COVID vaccines.  -Schedule follow up in 1 year or sooner as needed.   Preventive Care 11-71 Years Old, Female Preventive care refers to lifestyle choices and visits with your health care provider that can promote health and wellness. This includes:  A yearly physical exam. This is also called an annual wellness visit.  Regular dental and eye exams.  Immunizations.  Screening for certain conditions.  Healthy lifestyle choices, such as: ? Eating a healthy diet. ? Getting regular exercise. ? Not using drugs or products that contain nicotine and tobacco. ? Limiting alcohol use. What can I expect for my preventive care visit? Physical exam Your health care provider may check your:  Height and weight. These may be used to calculate your BMI (body mass index). BMI is a measurement that tells if you are at a healthy weight.  Heart rate and blood pressure.  Body temperature.  Skin for abnormal spots. Counseling Your health care provider may  ask you questions about your:  Past medical problems.  Family's medical history.  Alcohol, tobacco, and drug use.  Emotional well-being.  Home life and relationship well-being.  Sexual activity.  Diet, exercise, and sleep habits.  Work and work Statistician.  Access to firearms.  Method of birth control.  Menstrual cycle.  Pregnancy history. What immunizations do I need? Vaccines are usually given at various ages, according to a schedule. Your health care provider  will recommend vaccines for you based on your age, medical history, and lifestyle or other factors, such as travel or where you work.   What tests do I need? Blood tests  Lipid and cholesterol levels. These may be checked every 5 years starting at age 76.  Hepatitis C test.  Hepatitis B test. Screening  Diabetes screening. This is done by checking your blood sugar (glucose) after you have not eaten for a while (fasting).  STD (sexually transmitted disease) testing, if you are at risk.  BRCA-related cancer screening. This may be done if you have a family history of breast, ovarian, tubal, or peritoneal cancers.  Pelvic exam and Pap test. This may be done every 3 years starting at age 3. Starting at age 44, this may be done every 5 years if you have a Pap test in combination with an HPV test. Talk with your health care provider about your test results, treatment options, and if necessary, the need for more tests.   Follow these instructions at home: Eating and drinking  Eat a healthy diet that includes fresh fruits and vegetables, whole grains, lean protein, and low-fat dairy products.  Take vitamin and mineral supplements as recommended by your health care provider.  Do not drink alcohol if: ? Your health care provider tells you not to drink. ? You are pregnant, may be pregnant, or are planning to become pregnant.  If you drink alcohol: ? Limit how much you have to 0-1 drink a day. ? Be aware of how much alcohol is in your drink. In the [place] one drink equals one 12 oz bottle of beer (355 mL), one 5 oz glass of wine (148 mL), or one 1 oz glass of hard liquor (44 mL).   Lifestyle  Take daily care of your teeth and gums. Brush your teeth every morning and night with fluoride toothpaste. Floss one time each day.  Stay active. Exercise for at least 30 minutes 5 or more days each week.  Do not use any products that contain nicotine or tobacco, such as cigarettes, e-cigarettes, and  chewing tobacco. If you need help quitting, ask your health care provider.  Do not use drugs.  If you are sexually active, practice safe sex. Use a condom or other form of protection to prevent STIs (sexually transmitted infections).  If you do not wish to become pregnant, use a form of birth control. If you plan to become pregnant, see your health care provider for a prepregnancy visit.  Find healthy ways to cope with stress, such as: ? Meditation, yoga, or listening to music. ? Journaling. ? Talking to a trusted person. ? Spending time with friends and family. Safety  Always wear your seat belt while driving or riding in a vehicle.  Do not drive: ? If you have been drinking alcohol. Do not ride with someone who has been drinking. ? When you are tired or distracted. ? While texting.  Wear a helmet and other protective equipment during sports activities.  If you have firearms in  your house, make sure you follow all gun safety procedures.  Seek help if you have been physically or sexually abused. What's next?  Go to your health care provider once a year for an annual wellness visit.  Ask your health care provider how often you should have your eyes and teeth checked.  Stay up to date on all vaccines. This information is not intended to replace advice given to you by your health care provider. Make sure you discuss any questions you have with your health care provider. Document Revised: 03/14/2020 Document Reviewed: 03/28/2018 Elsevier Patient Education  2021 [place], [place] [facility]

## 2020-10-07 ENCOUNTER — Telehealth: Payer: Self-pay | Admitting: Internal Medicine

## 2020-10-07 NOTE — Telephone Encounter (Signed)
Patient is requesting that Fleet Contras give her a call regarding a billing questions. I offered the number to billing but she stated that she wanted to speak with Fleet Contras first.  Please advise.

## 2020-10-07 NOTE — Telephone Encounter (Signed)
Spoke with the pt and offered to give her the billing number to [facility] as Fleet Contras would not be able to help in this case.  Patient stated she was calling due to being charged for a physical which her insurance recommended she have.  I advised the pt she may want to contact her insurance and also gave the number to contact the billing office.

## 2020-12-16 ENCOUNTER — Ambulatory Visit: Payer: Commercial Managed Care - PPO | Admitting: Internal Medicine

## 2020-12-29 ENCOUNTER — Other Ambulatory Visit: Payer: Self-pay

## 2020-12-29 ENCOUNTER — Ambulatory Visit (INDEPENDENT_AMBULATORY_CARE_PROVIDER_SITE_OTHER): Payer: Commercial Managed Care - PPO | Admitting: Internal Medicine

## 2020-12-29 ENCOUNTER — Encounter: Payer: Self-pay | Admitting: Internal Medicine

## 2020-12-29 VITALS — BP 110/74 | HR 104 | Temp 98.7°F | Wt 233.8 lb

## 2020-12-29 DIAGNOSIS — E669 Obesity, unspecified: Secondary | ICD-10-CM

## 2020-12-29 NOTE — Progress Notes (Signed)
Established Patient Office Visit     This visit occurred during the SARS-CoV-2 public health emergency.  Safety protocols were in place, including screening questions prior to the visit, additional usage of staff PPE, and extensive cleaning of exam room while observing appropriate contact time as indicated for disinfecting solutions.    CC/Reason for Visit: Discuss obesity  HPI: [name] is a 30 y.o. female who is coming in today for the above mentioned reasons.  She has scheduled this visit to discuss obesity.  She is very discouraged as she feels like for the past 2 months she has been working out for 30 to 45 minutes every day with a personal trainer in addition to tracking her calories and has not seen much improvement on the scale.  Initially she told me she had gained 13 pounds but really her weight has remained basically unchanged since February per our measurements.  Past Medical/Surgical History: Past Medical History:  Diagnosis Date  . ADD (attention deficit disorder with hyperactivity)   . ADHD   . Anxiety    per patient-treated by [facility].  . Depression   . Eczema    as child  . HPV (human papilloma virus) infection   . Pneumonia 2016    Past Surgical History:  Procedure Laterality Date  . LAPAROSCOPIC OVARIAN CYSTECTOMY Right 03/10/2020   Procedure: LAPAROSCOPIC OVARIAN CYSTECTOMY;  Surgeon: Carrington Clamp, MD;  Location: [facility];  Service: Gynecology;  Laterality: Right;  POSSIBLE  . LAPAROSCOPY Right 03/10/2020   Procedure: LAPAROSCOPY OPERATIVE AND CAUTERY OF ENDOMETRIOSIS;  Surgeon: Carrington Clamp, MD;  Location: [facility];  Service: Gynecology;  Laterality: Right;  . TOE SURGERY Bilateral 2005   2 nd toes both sides    Social History:  reports that she has never smoked. She has never used smokeless tobacco. She reports that she does not drink alcohol and does not use drugs.  Allergies: No Known  Allergies  Family History:  Family History  Problem Relation Age of Onset  . Cancer Other        breast  . ADD / ADHD Other      Current Outpatient Medications:  .  ADDERALL XR 20 MG 24 hr capsule, , Disp: , Rfl: 0 .  brexpiprazole (REXULTI) 1 MG TABS tablet, Take by mouth at bedtime., Disp: , Rfl:  .  clonazePAM (KLONOPIN) 1 MG tablet, Take 1 mg by mouth 2 (two) times daily as needed., Disp: , Rfl:  .  fluticasone (FLONASE) 50 MCG/ACT nasal spray, Place 1 spray into both nostrils 2 (two) times daily. (Patient taking differently: Place 1 spray into both nostrils as needed.), Disp: 16 g, Rfl: 3 .  ibuprofen (ADVIL) 800 MG tablet, Take 800 mg by mouth every 8 (eight) hours as needed., Disp: , Rfl:  .  levonorgestrel (MIRENA) 20 MCG/24HR IUD, 1 each by Intrauterine route once. Inserted 2017, Disp: , Rfl:  .  traZODone (DESYREL) 150 MG tablet, Take 150 mg by mouth at bedtime., Disp: , Rfl:   Review of Systems:  Constitutional: Denies fever, chills, diaphoresis, appetite change and fatigue.  HEENT: Denies photophobia, eye pain, redness, hearing loss, ear pain, congestion, sore throat, rhinorrhea, sneezing, mouth sores, trouble swallowing, neck pain, neck stiffness and tinnitus.   Respiratory: Denies SOB, DOE, cough, chest tightness,  and wheezing.   Cardiovascular: Denies chest pain, palpitations and leg swelling.  Gastrointestinal: Denies nausea, vomiting, abdominal pain, diarrhea, constipation, blood in stool and  abdominal distention.  Genitourinary: Denies dysuria, urgency, frequency, hematuria, flank pain and difficulty urinating.  Endocrine: Denies: hot or cold intolerance, sweats, changes in hair or nails, polyuria, polydipsia. Musculoskeletal: Denies myalgias, back pain, joint swelling, arthralgias and gait problem.  Skin: Denies pallor, rash and wound.  Neurological: Denies dizziness, seizures, syncope, weakness, light-headedness, numbness and headaches.  Hematological: Denies  adenopathy. Easy bruising, personal or family bleeding history  Psychiatric/Behavioral: Denies suicidal ideation, mood changes, confusion, nervousness, sleep disturbance and agitation    Physical Exam: Vitals:   12/29/20 1530  BP: 110/74  Pulse: (!) 104  Temp: 98.7 F (37.1 C)  TempSrc: Oral  SpO2: 97%  Weight: 233 lb 12.8 oz (106.1 kg)    Body mass index is 36.62 kg/m.   Constitutional: NAD, calm, comfortable Eyes: PERRL, lids and conjunctivae normal ENMT: Mucous membranes are moist.  Neurologic: Grossly intact and nonfocal Psychiatric: Normal judgment and insight. Alert and oriented x 3.  Mood is tearful   Impression and Plan:  Obesity (BMI 35.0-39.9 without comorbidity) -Have counseled her that healthy weight loss is half pound to 2 pounds a week. -She might notice change in measurements and how her clothes fit before she notices changes in the scale especially as she is doing heavy workouts. -We have talked about how to create small and realistic goals. -We have talked about decreasing carbohydrate intake and focusing on lean protein and fruit/vegetables. -She would like to discuss additional weight loss methods.  I do not believe she would qualify for weight loss surgery as her BMI is not above 40 and she does not have any comorbid conditions. -I will refer to medical weight management.  Time spent: 32 minutes reviewing chart, interviewing and examining patient and formulating plan of care.    Chaya Jan, MD [facility]

## 2021-01-11 ENCOUNTER — Other Ambulatory Visit: Payer: Self-pay

## 2021-01-11 ENCOUNTER — Ambulatory Visit
Admission: RE | Admit: 2021-01-11 | Discharge: 2021-01-11 | Disposition: A | Discharge status: Home / Self Care | Payer: Commercial Managed Care - PPO | Source: Ambulatory Visit | Attending: Internal Medicine | Admitting: Internal Medicine

## 2021-01-11 DIAGNOSIS — Z1231 Encounter for screening mammogram for malignant neoplasm of breast: Secondary | ICD-10-CM

## 2021-01-11 DIAGNOSIS — Z803 Family history of malignant neoplasm of breast: Secondary | ICD-10-CM

## 2021-01-14 ENCOUNTER — Other Ambulatory Visit: Payer: Self-pay | Admitting: Internal Medicine

## 2021-01-14 DIAGNOSIS — R928 Other abnormal and inconclusive findings on diagnostic imaging of breast: Secondary | ICD-10-CM

## 2021-01-24 ENCOUNTER — Ambulatory Visit: Payer: Commercial Managed Care - PPO

## 2021-01-24 ENCOUNTER — Ambulatory Visit
Admission: RE | Admit: 2021-01-24 | Discharge: 2021-01-24 | Disposition: A | Discharge status: Home / Self Care | Payer: Commercial Managed Care - PPO | Source: Ambulatory Visit | Attending: Internal Medicine | Admitting: Internal Medicine

## 2021-01-24 ENCOUNTER — Other Ambulatory Visit: Payer: Self-pay

## 2021-01-24 DIAGNOSIS — R928 Other abnormal and inconclusive findings on diagnostic imaging of breast: Secondary | ICD-10-CM

## 2021-03-30 ENCOUNTER — Ambulatory Visit (INDEPENDENT_AMBULATORY_CARE_PROVIDER_SITE_OTHER): Payer: Commercial Managed Care - PPO | Admitting: Family Medicine

## 2021-03-30 ENCOUNTER — Encounter (INDEPENDENT_AMBULATORY_CARE_PROVIDER_SITE_OTHER): Payer: Self-pay | Admitting: Family Medicine

## 2021-03-30 ENCOUNTER — Other Ambulatory Visit: Payer: Self-pay

## 2021-03-30 VITALS — BP 105/68 | HR 83 | Temp 98.2°F | Ht 66.0 in | Wt 228.0 lb

## 2021-03-30 DIAGNOSIS — R5383 Other fatigue: Secondary | ICD-10-CM

## 2021-03-30 DIAGNOSIS — R0602 Shortness of breath: Secondary | ICD-10-CM

## 2021-03-30 DIAGNOSIS — Z1331 Encounter for screening for depression: Secondary | ICD-10-CM | POA: Diagnosis not present

## 2021-03-30 DIAGNOSIS — R739 Hyperglycemia, unspecified: Secondary | ICD-10-CM | POA: Diagnosis not present

## 2021-03-30 DIAGNOSIS — E669 Obesity, unspecified: Secondary | ICD-10-CM

## 2021-03-30 DIAGNOSIS — Z9189 Other specified personal risk factors, not elsewhere classified: Secondary | ICD-10-CM

## 2021-03-30 DIAGNOSIS — Z6835 Body mass index (BMI) 35.0-35.9, adult: Secondary | ICD-10-CM

## 2021-03-30 DIAGNOSIS — Z0289 Encounter for other administrative examinations: Secondary | ICD-10-CM

## 2021-03-30 NOTE — Progress Notes (Signed)
Chief Complaint:   OBESITY [name] (MR# [id number]) is a 30 y.o. female who presents for evaluation and treatment of obesity and related comorbidities. Current BMI is Body mass index is 36.8 kg/m. [name] has been struggling with her weight for many years and has been unsuccessful in either losing weight, maintaining weight loss, or reaching her healthy weight goal.  [name] is currently in the action stage of change and ready to dedicate time achieving and maintaining a healthier weight. [name] is interested in becoming our patient and working on intensive lifestyle modifications including (but not limited to) diet and exercise for weight loss.  [name]'s habits were reviewed today and are as follows: Her family eats meals together, she thinks her family will eat healthier with her, her desired weight loss is 38 lbs, she has been heavy most of her life, she started gaining weight since graduating high school, her heaviest weight ever was 245 pounds, she has significant food cravings issues, she snacks frequently in the evenings, she skips meals frequently, she is frequently drinking liquids with calories, she frequently makes poor food choices, she has problems with excessive hunger, she frequently eats larger portions than normal, she has binge eating behaviors, and she struggles with emotional eating.  Depression Screen [name]'s Food and Mood (modified PHQ-9) score was 22.  Depression screen [name] 2/9 03/30/2021  Decreased Interest 3  Down, Depressed, Hopeless 3  PHQ - 2 Score 6  Altered sleeping 2  Tired, decreased energy 3  Change in appetite 3  Feeling bad or failure about yourself  3  Trouble concentrating 3  Moving slowly or fidgety/restless 0  Suicidal thoughts 2  PHQ-9 Score 22  Difficult doing work/chores Very difficult   Subjective:   1. Other fatigue [name] admits to daytime somnolence and admits to waking up still tired. Patent has a history of symptoms of  daytime fatigue and morning fatigue. [name] generally gets 8 hours of sleep per night, and states that she has difficulty falling asleep. Snoring is not present. Apneic episodes are not present. Epworth Sleepiness Score is 15.  2. SOB (shortness of breath) on exertion [name] notes increasing shortness of breath with exercising and seems to be worsening over time with weight gain. She notes getting out of breath sooner with activity than she used to. This has not gotten worse recently. Joselin denies shortness of breath at rest or orthopnea.  3. Hyperglycemia [name] has a history of some elevated glucose readings in the past. She notes AM anorexia with PM poyphagia, which is consistent with increase in insulin levels.  4. At risk for heart disease [name] is at a higher than average risk for cardiovascular disease due to obesity.   Assessment/Plan:   1. Other fatigue [name] does feel that her weight is causing her energy to be lower than it should be. Fatigue may be related to obesity, depression or many other causes. Labs will be ordered, and in the meanwhile, [name] will focus on self care including making healthy food choices, increasing physical activity and focusing on stress reduction.  - Vitamin B12 - VITAMIN D 25 Hydroxy (Vit-D Deficiency, Fractures) - Lipid Panel With LDL/HDL Ratio - TSH - Folate - T3 - CBC With Differential - EKG 12-Lead - T4, free  2. SOB (shortness of breath) on exertion [name] does feel that she gets out of breath more easily that she used to when she exercises. [name] shortness of breath appears to be obesity related and  exercise induced. She has agreed to work on weight loss and gradually increase exercise to treat her exercise induced shortness of breath. Will continue to monitor closely.  3. Hyperglycemia Fasting labs will be obtained today, and results with be discussed with [name] in 2 weeks at her follow up visit. In the meanwhile [name] start on her  Category 2 and will work on weight loss efforts.  - Insulin, random - Comprehensive metabolic panel - Hemoglobin A1c  4. Screening for depression [name] had a positive depression screening. Depression is commonly associated with obesity and often results in emotional eating behaviors. We will monitor this closely and work on CBT to help improve the non-hunger eating patterns. Referral to Psychology may be required if no improvement is seen as she continues in our clinic.  5. At risk for heart disease [name] was given approximately 30 minutes of coronary artery disease prevention counseling today. She is 30 y.o. female and has risk factors for heart disease including obesity. We discussed intensive lifestyle modifications today with an emphasis on specific weight loss instructions and strategies.   Repetitive spaced learning was employed today to elicit superior memory formation and behavioral change.  6. Obesity with current BMI 36.9 [name] is currently in the action stage of change and her goal is to continue with weight loss efforts. I recommend [name] begin the structured treatment plan as follows:  She has agreed to the Category 2 Plan + 100 calories.  Exercise goals: No exercise has been prescribed for now, while we concentrate on nutritional changes.  Behavioral modification strategies: increasing lean protein intake and no skipping meals.  She was informed of the importance of frequent follow-up visits to maximize her success with intensive lifestyle modifications for her multiple health conditions. She was informed we would discuss her lab results at her next visit unless there is a critical issue that needs to be addressed sooner. [name] agreed to keep her next visit at the agreed upon time to discuss these results.  Objective:   Blood pressure 105/68, pulse 83, temperature 98.2 F (36.8 C), height 5\' 6"  (1.676 m), weight 228 lb (103.4 kg), SpO2 99 %. Body mass index is 36.8  kg/m.  EKG: Normal sinus rhythm, rate 80 BPM.  Indirect Calorimeter completed today shows a VO2 of 300 and a REE of 2074.  Her calculated basal metabolic rate is 2075 thus her basal metabolic rate is better than expected.  General: Cooperative, alert, well developed, in no acute distress. HEENT: Conjunctivae and lids unremarkable. Cardiovascular: Regular rhythm.  Lungs: Normal work of breathing. Neurologic: No focal deficits.   Lab Results  Component Value Date   CREATININE 0.88 09/22/2020   BUN 10 09/22/2020   NA 136 09/22/2020   K 4.2 09/22/2020   CL 104 09/22/2020   CO2 25 09/22/2020   Lab Results  Component Value Date   ALT 15 09/22/2020   AST 14 09/22/2020   ALKPHOS 36 (L) 09/22/2020   BILITOT 0.4 09/22/2020   Lab Results  Component Value Date   HGBA1C 5.1 12/14/2016   No results found for: INSULIN Lab Results  Component Value Date   TSH 2.20 09/22/2020   Lab Results  Component Value Date   CHOL 164 09/22/2020   HDL 47.20 09/22/2020   LDLCALC 102 (H) 09/22/2020   TRIG 71.0 09/22/2020   CHOLHDL 3 09/22/2020   Lab Results  Component Value Date   WBC 8.0 09/22/2020   HGB 14.3 09/22/2020   HCT 42.0 09/22/2020  MCV 86.7 09/22/2020   PLT 400.0 09/22/2020   No results found for: IRON, TIBC, FERRITIN  Attestation Statements:   Reviewed by clinician on day of visit: allergies, medications, problem list, medical history, surgical history, family history, social history, and previous encounter notes.   I, Burt Knack, am acting as transcriptionist for Quillian Quince, MD.  I have reviewed the above documentation for accuracy and completeness, and I agree with the above. - Quillian Quince, MD

## 2021-03-31 LAB — FOLATE: Folate: 8.6 ng/mL (ref >3.0)

## 2021-03-31 LAB — CBC WITH DIFFERENTIAL
Basophils Absolute: 0 10*3/uL (ref 0.0–0.2)
Basos: 1 %
EOS (ABSOLUTE): 0.1 10*3/uL (ref 0.0–0.4)
Eos: 1 %
Hematocrit: 43.9 % (ref 34.0–46.6)
Hemoglobin: 14.4 g/dL (ref 11.1–15.9)
Immature Grans (Abs): 0 10*3/uL (ref 0.0–0.1)
Immature Granulocytes: 0 %
Lymphocytes Absolute: 1.9 10*3/uL (ref 0.7–3.1)
Lymphs: 27 %
MCH: 29.3 pg (ref 26.6–33.0)
MCHC: 32.8 g/dL (ref 31.5–35.7)
MCV: 89 fL (ref 79–97)
Monocytes Absolute: 0.3 10*3/uL (ref 0.1–0.9)
Monocytes: 5 %
Neutrophils Absolute: 4.8 10*3/uL (ref 1.4–7.0)
Neutrophils: 66 %
RBC: 4.92 x10E6/uL (ref 3.77–5.28)
RDW: 12.4 % (ref 11.7–15.4)
WBC: 7.1 10*3/uL (ref 3.4–10.8)

## 2021-03-31 LAB — COMPREHENSIVE METABOLIC PANEL
ALT: 14 IU/L (ref 0–32)
AST: 15 IU/L (ref 0–40)
Albumin/Globulin Ratio: 2.2 (ref 1.2–2.2)
Albumin: 4.8 g/dL (ref 3.8–4.8)
Alkaline Phosphatase: 46 IU/L (ref 44–121)
BUN/Creatinine Ratio: 12 (ref 9–23)
BUN: 11 mg/dL (ref 6–20)
Bilirubin Total: 0.3 mg/dL (ref 0.0–1.2)
CO2: 18 mmol/L — ABNORMAL LOW (ref 20–29)
Calcium: 9.7 mg/dL (ref 8.7–10.2)
Chloride: 102 mmol/L (ref 96–106)
Creatinine, Ser: 0.94 mg/dL (ref 0.57–1.00)
Globulin, Total: 2.2 g/dL (ref 1.5–4.5)
Glucose: 84 mg/dL (ref 65–99)
Potassium: 4.2 mmol/L (ref 3.5–5.2)
Sodium: 138 mmol/L (ref 134–144)
Total Protein: 7 g/dL (ref 6.0–8.5)
eGFR: 83 mL/min/{1.73_m2} (ref >59)

## 2021-03-31 LAB — INSULIN, RANDOM: INSULIN: 11.5 u[IU]/mL (ref 2.6–24.9)

## 2021-03-31 LAB — VITAMIN D 25 HYDROXY (VIT D DEFICIENCY, FRACTURES): Vit D, 25-Hydroxy: 39.8 ng/mL (ref 30.0–100.0)

## 2021-03-31 LAB — VITAMIN B12: Vitamin B-12: 376 pg/mL (ref 232–1245)

## 2021-03-31 LAB — HEMOGLOBIN A1C
Est. average glucose Bld gHb Est-mCnc: 97 mg/dL
Hgb A1c MFr Bld: 5 % (ref 4.8–5.6)

## 2021-03-31 LAB — T3: T3, Total: 131 ng/dL (ref 71–180)

## 2021-03-31 LAB — LIPID PANEL WITH LDL/HDL RATIO
Cholesterol, Total: 169 mg/dL (ref 100–199)
HDL: 48 mg/dL (ref >39)
LDL Chol Calc (NIH): 102 mg/dL — ABNORMAL HIGH (ref 0–99)
LDL/HDL Ratio: 2.1 ratio (ref 0.0–3.2)
Triglycerides: 103 mg/dL (ref 0–149)
VLDL Cholesterol Cal: 19 mg/dL (ref 5–40)

## 2021-03-31 LAB — TSH: TSH: 1.54 u[IU]/mL (ref 0.450–4.500)

## 2021-03-31 LAB — T4, FREE: Free T4: 1.2 ng/dL (ref 0.82–1.77)

## 2021-04-13 ENCOUNTER — Other Ambulatory Visit: Payer: Self-pay

## 2021-04-13 ENCOUNTER — Ambulatory Visit (INDEPENDENT_AMBULATORY_CARE_PROVIDER_SITE_OTHER): Payer: Commercial Managed Care - PPO | Admitting: Family Medicine

## 2021-04-13 ENCOUNTER — Encounter (INDEPENDENT_AMBULATORY_CARE_PROVIDER_SITE_OTHER): Payer: Self-pay | Admitting: Family Medicine

## 2021-04-13 VITALS — BP 102/68 | HR 98 | Temp 98.4°F | Ht 66.0 in | Wt 223.0 lb

## 2021-04-13 DIAGNOSIS — E559 Vitamin D deficiency, unspecified: Secondary | ICD-10-CM

## 2021-04-13 DIAGNOSIS — E538 Deficiency of other specified B group vitamins: Secondary | ICD-10-CM

## 2021-04-13 DIAGNOSIS — Z9189 Other specified personal risk factors, not elsewhere classified: Secondary | ICD-10-CM | POA: Diagnosis not present

## 2021-04-13 DIAGNOSIS — E66812 Obesity, class 2: Secondary | ICD-10-CM

## 2021-04-13 DIAGNOSIS — E8881 Metabolic syndrome: Secondary | ICD-10-CM

## 2021-04-13 DIAGNOSIS — E88819 Insulin resistance, unspecified: Secondary | ICD-10-CM

## 2021-04-13 DIAGNOSIS — E7849 Other hyperlipidemia: Secondary | ICD-10-CM

## 2021-04-13 DIAGNOSIS — E7800 Pure hypercholesterolemia, unspecified: Secondary | ICD-10-CM

## 2021-04-13 DIAGNOSIS — Z6835 Body mass index (BMI) 35.0-35.9, adult: Secondary | ICD-10-CM

## 2021-04-13 MED ORDER — METFORMIN HCL 500 MG PO TABS: 500.0000 mg | ORAL_TABLET | Freq: Every day | ORAL | 0 refills | Status: DC

## 2021-04-13 MED ORDER — VITAMIN D (ERGOCALCIFEROL) 1.25 MG (50000 UNIT) PO CAPS: 50000.0000 [IU] | ORAL_CAPSULE | ORAL | 0 refills | Status: DC

## 2021-04-14 NOTE — Progress Notes (Signed)
Chief Complaint:   OBESITY [name] is here to discuss her progress with her obesity treatment plan along with follow-up of her obesity related diagnoses. [name] is on the Category 2 Plan + 100 calories and states she is following her eating plan approximately 99% of the time. [name] states she is walking for 60 minutes 1-2 times per week.  Today's visit was #: 2 Starting weight: 228 lbs Starting date: 03/30/2021 Today's weight: 223 lbs Today's date: 04/13/2021 Total lbs lost to date: 5 Total lbs lost since last in-office visit: 5  Interim History: [name] has done very well with diet, exercise, and weight loss. Her hunger is mostly controlled and she did well with meal planning and prepping.  Subjective:   1. Vitamin D deficiency [name] Vit D is lightly below the goal of 50. I discussed labs with the patient today.  2. B12 deficiency [name] B12 is below the goal of 500. She is now on a B12 rich diet. I discussed labs with the patient today.  3. Hyperlipidemia, pure [name] LDL is very mildly elevated, and her triglycerides and HDL are within normal limits. She is not on a statin. I discussed labs with the patient today.  4. Insulin resistance [name] A1c and glucose were within normal limits, but her fasting insulin is >5 which is consistent with insulin resistance. I discussed labs with the patient today.  5. At risk for diabetes mellitus [name] is at higher than average risk for developing diabetes due to obesity.   Assessment/Plan:   1. Vitamin D deficiency Low Vitamin D level contributes to fatigue and are associated with obesity, breast, and colon cancer. [name] agreed to start prescription Vitamin D 50,000 IU every week with no refills. We will recheck labs in 3 months, and she will follow-up for routine testing of Vitamin D, at least 2-3 times per year to avoid over-replacement.  - Vitamin D, Ergocalciferol, (DRISDOL) 1.25 MG (50000 UNIT) CAPS capsule; Take 1  capsule (50,000 Units total) by mouth every 7 (seven) days.  Dispense: 4 capsule; Refill: 0  2. B12 deficiency The diagnosis was reviewed with the patient. [name] will continue her Category 2 plan and we will recheck labs in 3 months. Orders and follow up as documented in patient record.  3. Hyperlipidemia, pure Cardiovascular risk and specific lipid/LDL goals reviewed. We discussed several lifestyle modifications today. [name] will continue to work on diet, exercise and weight loss efforts. We will recheck labs in 3 months. Orders and follow up as documented in patient record.   4. Insulin resistance [name] agreed to start metformin 500 mg q AM with food, with no refills. She will continue with diet, exercise, weight loss, and decreasing simple carbohydrates to help decrease the risk of diabetes. [name] agreed to follow-up with [name] as directed to closely monitor her progress.  - metFORMIN (GLUCOPHAGE) 500 MG tablet; Take 1 tablet (500 mg total) by mouth daily with breakfast.  Dispense: 30 tablet; Refill: 0  5. At risk for diabetes mellitus [name] was given approximately 30 minutes of diabetes education and counseling today. We discussed intensive lifestyle modifications today with an emphasis on weight loss as well as increasing exercise and decreasing simple carbohydrates in her diet. We also reviewed medication options with an emphasis on risk versus benefit of those discussed.   Repetitive spaced learning was employed today to elicit superior memory formation and behavioral change.  6. Obesity with current BMI 36.0 [name] is currently in the action stage of  change. As such, her goal is to continue with weight loss efforts. She has agreed to the Category 2 Plan + 100 calories.   Exercise goals: As is.  Behavioral modification strategies: decreasing simple carbohydrates and meal planning and cooking strategies.  [name] has agreed to follow-up with our clinic in 2 weeks. She was informed of  the importance of frequent follow-up visits to maximize her success with intensive lifestyle modifications for her multiple health conditions.   Objective:   Blood pressure 102/68, pulse 98, temperature 98.4 F (36.9 C), height 5\' 6"  (1.676 m), weight 223 lb (101.2 kg), SpO2 100 %. Body mass index is 35.99 kg/m.  General: Cooperative, alert, well developed, in no acute distress. HEENT: Conjunctivae and lids unremarkable. Cardiovascular: Regular rhythm.  Lungs: Normal work of breathing. Neurologic: No focal deficits.   Lab Results  Component Value Date   CREATININE 0.94 03/30/2021   BUN 11 03/30/2021   NA 138 03/30/2021   K 4.2 03/30/2021   CL 102 03/30/2021   CO2 18 (L) 03/30/2021   Lab Results  Component Value Date   ALT 14 03/30/2021   AST 15 03/30/2021   ALKPHOS 46 03/30/2021   BILITOT 0.3 03/30/2021   Lab Results  Component Value Date   HGBA1C 5.0 03/30/2021   HGBA1C 5.1 12/14/2016   Lab Results  Component Value Date   INSULIN 11.5 03/30/2021   Lab Results  Component Value Date   TSH 1.540 03/30/2021   Lab Results  Component Value Date   CHOL 169 03/30/2021   HDL 48 03/30/2021   LDLCALC 102 (H) 03/30/2021   TRIG 103 03/30/2021   CHOLHDL 3 09/22/2020   Lab Results  Component Value Date   VD25OH 39.8 03/30/2021   Lab Results  Component Value Date   WBC 7.1 03/30/2021   HGB 14.4 03/30/2021   HCT 43.9 03/30/2021   MCV 89 03/30/2021   PLT 400.0 09/22/2020   No results found for: IRON, TIBC, FERRITIN  Attestation Statements:   Reviewed by clinician on day of visit: allergies, medications, problem list, medical history, surgical history, family history, social history, and previous encounter notes.   I, 09/24/2020, am acting as transcriptionist for Burt Knack, MD.  I have reviewed the above documentation for accuracy and completeness, and I agree with the above. -  Quillian Quince, MD

## 2021-04-29 ENCOUNTER — Ambulatory Visit (INDEPENDENT_AMBULATORY_CARE_PROVIDER_SITE_OTHER): Payer: Commercial Managed Care - PPO | Admitting: Family Medicine

## 2021-05-09 ENCOUNTER — Ambulatory Visit (INDEPENDENT_AMBULATORY_CARE_PROVIDER_SITE_OTHER): Payer: Commercial Managed Care - PPO | Admitting: Family Medicine

## 2021-05-11 ENCOUNTER — Other Ambulatory Visit: Payer: Self-pay

## 2021-05-11 ENCOUNTER — Encounter: Payer: Self-pay | Admitting: Emergency Medicine

## 2021-05-11 ENCOUNTER — Ambulatory Visit (INDEPENDENT_AMBULATORY_CARE_PROVIDER_SITE_OTHER): Payer: Commercial Managed Care - PPO | Admitting: Family Medicine

## 2021-05-11 ENCOUNTER — Ambulatory Visit (INDEPENDENT_AMBULATORY_CARE_PROVIDER_SITE_OTHER): Payer: Commercial Managed Care - PPO

## 2021-05-11 ENCOUNTER — Encounter (INDEPENDENT_AMBULATORY_CARE_PROVIDER_SITE_OTHER): Payer: Self-pay | Admitting: Family Medicine

## 2021-05-11 ENCOUNTER — Ambulatory Visit
Admission: EM | Admit: 2021-05-11 | Discharge: 2021-05-11 | Disposition: A | Discharge status: Home / Self Care | Payer: Commercial Managed Care - PPO | Attending: Internal Medicine | Admitting: Internal Medicine

## 2021-05-11 ENCOUNTER — Ambulatory Visit: Payer: Self-pay

## 2021-05-11 VITALS — BP 101/67 | HR 72 | Temp 97.9°F | Ht 66.0 in | Wt 221.0 lb

## 2021-05-11 DIAGNOSIS — S92532A Displaced fracture of distal phalanx of left lesser toe(s), initial encounter for closed fracture: Secondary | ICD-10-CM | POA: Diagnosis not present

## 2021-05-11 DIAGNOSIS — E8881 Metabolic syndrome: Secondary | ICD-10-CM | POA: Diagnosis not present

## 2021-05-11 DIAGNOSIS — E559 Vitamin D deficiency, unspecified: Secondary | ICD-10-CM | POA: Diagnosis not present

## 2021-05-11 DIAGNOSIS — Z9189 Other specified personal risk factors, not elsewhere classified: Secondary | ICD-10-CM

## 2021-05-11 DIAGNOSIS — E66812 Obesity, class 2: Secondary | ICD-10-CM

## 2021-05-11 DIAGNOSIS — Z6835 Body mass index (BMI) 35.0-35.9, adult: Secondary | ICD-10-CM | POA: Diagnosis not present

## 2021-05-11 DIAGNOSIS — E88819 Insulin resistance, unspecified: Secondary | ICD-10-CM

## 2021-05-11 MED ORDER — VITAMIN D (ERGOCALCIFEROL) 1.25 MG (50000 UNIT) PO CAPS: 50000.0000 [IU] | ORAL_CAPSULE | ORAL | 0 refills | Status: DC

## 2021-05-11 MED ORDER — METFORMIN HCL 500 MG PO TABS: 500.0000 mg | ORAL_TABLET | Freq: Every day | ORAL | 0 refills | Status: DC

## 2021-05-11 NOTE — ED Provider Notes (Signed)
[facility]    CSN: [id number] Arrival date & time: 05/11/21  1555      History   Chief Complaint Chief Complaint  Patient presents with   Foot Pain    HPI [name] is a 30 y.o. female.   Patient presents with left fifth toe pain that started last night after injury.  Patient reports that she got her toe stuck in the couch and accidentally sat on her toe.  Having pain and swelling in the left toe.  Denies any numbness or tingling.  Patient reports that she injured the same toe in March of this year after slamming it in a car door.  Patient was not seen to be evaluated for injury in March.  Has taken ibuprofen for pain with minimal improvement.   Foot Pain   Past Medical History:  Diagnosis Date   ADD (attention deficit disorder with hyperactivity)    ADHD    Anxiety    per patient-treated by [facility].   Bipolar 1 disorder (HCC)    Depression    Eczema    as child   Endometriosis    HPV (human papilloma virus) infection    Pneumonia 2016   SOB (shortness of breath)     Patient Active Problem List   Diagnosis Date Noted   VENOUS INSUFFICIENCY, MILD 07/22/2009   DYSMENORRHEA 03/03/2008   ECZEMA, ATOPIC 07/15/2007    Past Surgical History:  Procedure Laterality Date   LAPAROSCOPIC OVARIAN CYSTECTOMY Right 03/10/2020   Procedure: LAPAROSCOPIC OVARIAN CYSTECTOMY;  Surgeon: Carrington Clamp, MD;  Location: [facility] [place];  Service: Gynecology;  Laterality: Right;  POSSIBLE   LAPAROSCOPY Right 03/10/2020   Procedure: LAPAROSCOPY OPERATIVE AND CAUTERY OF ENDOMETRIOSIS;  Surgeon: Carrington Clamp, MD;  Location: [facility] [place];  Service: Gynecology;  Laterality: Right;   TOE SURGERY Bilateral 2005   2 nd toes both sides    OB History     Gravida  0   Para  0   Term  0   Preterm  0   AB  0   Living  0      SAB  0   IAB  0   Ectopic  0   Multiple  0   Live Births  0            Home  Medications    Prior to Admission medications   Medication Sig Start Date End Date Taking? Authorizing Provider  ADDERALL XR 20 MG 24 hr capsule  05/08/17  Yes [provider]  brexpiprazole (REXULTI) 1 MG TABS tablet Take by mouth at bedtime.   Yes [provider]  clonazePAM (KLONOPIN) 1 MG tablet Take 1 mg by mouth 2 (two) times daily as needed. 06/07/20  Yes [provider]  fluticasone (FLONASE) 50 MCG/ACT nasal spray Place 1 spray into both nostrils 2 (two) times daily. Patient taking differently: Place 1 spray into both nostrils as needed. 08/11/19  Yes [place], Betty G, MD  ibuprofen (ADVIL) 800 MG tablet Take 800 mg by mouth every 8 (eight) hours as needed.   Yes [provider]  metFORMIN (GLUCOPHAGE) 500 MG tablet Take 1 tablet (500 mg total) by mouth daily with breakfast. 05/11/21  Yes Dalbert Garnet, Caren D, MD  traZODone (DESYREL) 150 MG tablet Take 150 mg by mouth at bedtime.   Yes [provider]  Vitamin D, Ergocalciferol, (DRISDOL) 1.25 MG (50000 UNIT) CAPS capsule Take 1 capsule (50,000 Units total)  by mouth every 7 (seven) days. 05/11/21  Yes Wilder Glade, MD    Family History Family History  Problem Relation Age of Onset   Bipolar disorder Mother    Drug abuse Mother    Obesity Mother    Breast cancer Paternal Grandmother 108   Cancer Other        breast   ADD / ADHD Other     Social History Social History   Tobacco Use   Smoking status: Never   Smokeless tobacco: Never  Vaping Use   Vaping Use: Some days   Substances: Nicotine, Flavoring  Substance Use Topics   Alcohol use: No   Drug use: No     Allergies   Patient has no known allergies.   Review of Systems Review of Systems Per HPI  Physical Exam Triage Vital Signs ED Triage Vitals  Enc Vitals Group     BP 05/11/21 1616 107/73     Pulse Rate 05/11/21 1616 73     Resp 05/11/21 1616 18     Temp 05/11/21 1616 98.9 F (37.2 C)     Temp Source 05/11/21  1616 Oral     SpO2 05/11/21 1616 97 %     Weight 05/11/21 1617 220 lb (99.8 kg)     Height 05/11/21 1617 5\' 6"  (1.676 m)     Head Circumference --      Peak Flow --      Pain Score 05/11/21 1617 8     Pain Loc --      Pain Edu? --      Excl. in GC? --    No data found.  Updated Vital Signs BP 107/73 (BP Location: Left Arm)   Pulse 73   Temp 98.9 F (37.2 C) (Oral)   Resp 18   Ht 5\' 6"  (1.676 m)   Wt 220 lb (99.8 kg)   LMP 05/07/2021   SpO2 97%   BMI 35.51 kg/m   Visual Acuity Right Eye Distance:   Left Eye Distance:   Bilateral Distance:    Right Eye Near:   Left Eye Near:    Bilateral Near:     Physical Exam Constitutional:      General: She is not in acute distress.    Appearance: Normal appearance. She is not toxic-appearing or diaphoretic.  HENT:     Head: Normocephalic and atraumatic.  Eyes:     Extraocular Movements: Extraocular movements intact.     Conjunctiva/sclera: Conjunctivae normal.  Pulmonary:     Effort: Pulmonary effort is normal.  Musculoskeletal:     Right foot: Normal.     Left foot: Decreased range of motion. Normal capillary refill. Swelling, tenderness and bony tenderness present. No deformity, laceration or crepitus. Normal pulse.     Comments: Redness and swelling noted to left fifth digit of left toe.  Neurovascular intact.  No bruising or lacerations noted.  Skin:    General: Skin is warm and dry.  Neurological:     General: No focal deficit present.     Mental Status: She is alert and oriented to person, place, and time. Mental status is at baseline.  Psychiatric:        Mood and Affect: Mood normal.        Behavior: Behavior normal.        Thought Content: Thought content normal.        Judgment: Judgment normal.     UC Treatments / Results  Labs (all  labs ordered are listed, but only abnormal results are displayed) Labs Reviewed - No data to display  EKG   Radiology DG Toe 5th Left  Result Date:  05/11/2021 CLINICAL DATA:  Left fifth toe injury EXAM: DG TOE 5TH LEFT COMPARISON:  None. FINDINGS: Transverse, mildly comminuted, mildly displaced fracture through the distal phalanx of the fifth toe. No evidence of articular surface involvement. The joint spaces are preserved. IMPRESSION: Fracture of the distal phalanx of the fifth toe. Electronically Signed   By: Wiliam Ke M.D.   On: 05/11/2021 16:52    Procedures Procedures (including critical care time)  Medications Ordered in UC Medications - No data to display  Initial Impression / Assessment and Plan / UC Course  I have reviewed the triage vital signs and the nursing notes.  Pertinent labs & imaging results that were available during my care of the patient were reviewed by me and considered in my medical decision making (see chart for details).     Patient has displaced fracture of the distal phalanx of the left fifth digit on the left foot.  Buddy tape was applied.  Patient was advised to follow-up with orthopedist tomorrow for further evaluation and management as fracture is displaced.  Although, neurovascular was intact of left toe and no red flags seen on exam so no indication for immediate referral to ortho at this time.Discussed strict return precautions. Patient verbalized understanding and is agreeable with plan.  Final Clinical Impressions(s) / UC Diagnoses   Final diagnoses:  Displaced fracture of distal phalanx of left lesser toe(s), initial encounter for closed fracture     Discharge Instructions      You have a fracture of your left toe.  Buddy tape has been applied to help with immobilization.  Ice application will be helpful.  You may also take ibuprofen as needed for pain and inflammation.  Please follow-up with provided contact information for orthopedist tomorrow for further evaluation and management.     ED Prescriptions   None    PDMP not reviewed this encounter.   Lance Muss, FNP 05/11/21  1724

## 2021-05-11 NOTE — Discharge Instructions (Addendum)
You have a fracture of your left toe.  Buddy tape has been applied to help with immobilization.  Ice application will be helpful.  You may also take ibuprofen as needed for pain and inflammation.  Please follow-up with provided contact information for orthopedist tomorrow for further evaluation and management.

## 2021-05-11 NOTE — ED Triage Notes (Signed)
Patient c/o left 5th pinky toe injury last night which is recurrent from a previous injury back in March.  Toe is red and swollen.  Patient has been taken Ibuprofen for the pain.

## 2021-05-11 NOTE — Progress Notes (Signed)
Chief Complaint:   OBESITY [name] is here to discuss her progress with her obesity treatment plan along with follow-up of her obesity related diagnoses. [name] is on the Category 2 Plan + 100 calories and states she is following her eating plan approximately 90% of the time. [name] states she is walking for 60 minutes 2 times per week.  Today's visit was #: 3 Starting weight: 228 lbs Starting date: 03/30/2021 Today's weight: 221 lbs Today's date: 05/11/2021 Total lbs lost to date: 7 Total lbs lost since last in-office visit: 2  Interim History: [name] continues to do very well with weight loss. She is getting bored with her lunch options and would like to add salads if possible.  Subjective:   1. Insulin resistance [name] started metformin, and she denies nausea or vomiting. She notes decreased polyphagia, and she continues to do well with weight loss.  2. Vitamin D deficiency [name] is stable on Vit D, and she denies nausea, vomiting, or muscle weakness. Her Vit D level is not yet at goal.  3. At risk for impaired metabolic function [name] is at increased risk for impaired metabolic function if protein decreases.  Assessment/Plan:   1. Insulin resistance [name] will continue to work on weight loss, exercise, and decreasing simple carbohydrates to help decrease the risk of diabetes. We will refill metformin for 1 month. [name] agreed to follow-up with [place] as directed to closely monitor her progress.  - metFORMIN (GLUCOPHAGE) 500 MG tablet; Take 1 tablet (500 mg total) by mouth daily with breakfast.  Dispense: 30 tablet; Refill: 0  2. Vitamin D deficiency Low Vitamin D level contributes to fatigue and are associated with obesity, breast, and colon cancer. [name] agreed to continue prescription Vitamin D 50,000 IU every week, and we will refill for 1 month. She will follow-up for routine testing of Vitamin D, at least 2-3 times per year to avoid over-replacement.  - Vitamin D,  Ergocalciferol, (DRISDOL) 1.25 MG (50000 UNIT) CAPS capsule; Take 1 capsule (50,000 Units total) by mouth every 7 (seven) days.  Dispense: 4 capsule; Refill: 0  3. At risk for impaired metabolic function [name] was given approximately 15 minutes of impaired  metabolic function prevention counseling today. We discussed intensive lifestyle modifications today with an emphasis on specific nutrition and exercise instructions and strategies.   Repetitive spaced learning was employed today to elicit superior memory formation and behavioral change.  4. Obesity with current BMI 35.8 [name] is currently in the action stage of change. As such, her goal is to continue with weight loss efforts. She has agreed to the Category 2 Plan with lunch options.   Exercise goals: As is.  Behavioral modification strategies: increasing lean protein intake.  [name] has agreed to follow-up with our clinic in 2 weeks. She was informed of the importance of frequent follow-up visits to maximize her success with intensive lifestyle modifications for her multiple health conditions.   Objective:   Blood pressure 101/67, pulse 72, temperature 97.9 F (36.6 C), temperature source Core (Comment), height 5\' 6"  (1.676 m), weight 221 lb (100.2 kg), SpO2 100 %. Body mass index is 35.67 kg/m.  General: Cooperative, alert, well developed, in no acute distress. HEENT: Conjunctivae and lids unremarkable. Cardiovascular: Regular rhythm.  Lungs: Normal work of breathing. Neurologic: No focal deficits.   Lab Results  Component Value Date   CREATININE 0.94 03/30/2021   BUN 11 03/30/2021   NA 138 03/30/2021   K 4.2 03/30/2021   CL  102 03/30/2021   CO2 18 (L) 03/30/2021   Lab Results  Component Value Date   ALT 14 03/30/2021   AST 15 03/30/2021   ALKPHOS 46 03/30/2021   BILITOT 0.3 03/30/2021   Lab Results  Component Value Date   HGBA1C 5.0 03/30/2021   HGBA1C 5.1 12/14/2016   Lab Results  Component Value Date    INSULIN 11.5 03/30/2021   Lab Results  Component Value Date   TSH 1.540 03/30/2021   Lab Results  Component Value Date   CHOL 169 03/30/2021   HDL 48 03/30/2021   LDLCALC 102 (H) 03/30/2021   TRIG 103 03/30/2021   CHOLHDL 3 09/22/2020   Lab Results  Component Value Date   VD25OH 39.8 03/30/2021   Lab Results  Component Value Date   WBC 7.1 03/30/2021   HGB 14.4 03/30/2021   HCT 43.9 03/30/2021   MCV 89 03/30/2021   PLT 400.0 09/22/2020   No results found for: IRON, TIBC, FERRITIN  Attestation Statements:   Reviewed by clinician on day of visit: allergies, medications, problem list, medical history, surgical history, family history, social history, and previous encounter notes.   I, Burt Knack, am acting as transcriptionist for Quillian Quince, MD.  I have reviewed the above documentation for accuracy and completeness, and I agree with the above. -  Quillian Quince, MD

## 2021-06-02 ENCOUNTER — Ambulatory Visit (INDEPENDENT_AMBULATORY_CARE_PROVIDER_SITE_OTHER): Payer: Commercial Managed Care - PPO | Admitting: Family Medicine

## 2021-06-02 ENCOUNTER — Encounter (INDEPENDENT_AMBULATORY_CARE_PROVIDER_SITE_OTHER): Payer: Self-pay | Admitting: Family Medicine

## 2021-06-02 ENCOUNTER — Other Ambulatory Visit: Payer: Self-pay

## 2021-06-02 VITALS — BP 113/72 | HR 85 | Temp 98.4°F | Ht 66.0 in | Wt 216.0 lb

## 2021-06-02 DIAGNOSIS — E88819 Insulin resistance, unspecified: Secondary | ICD-10-CM

## 2021-06-02 DIAGNOSIS — Z6836 Body mass index (BMI) 36.0-36.9, adult: Secondary | ICD-10-CM

## 2021-06-02 DIAGNOSIS — Z9189 Other specified personal risk factors, not elsewhere classified: Secondary | ICD-10-CM | POA: Diagnosis not present

## 2021-06-02 DIAGNOSIS — E8881 Metabolic syndrome: Secondary | ICD-10-CM | POA: Diagnosis not present

## 2021-06-02 DIAGNOSIS — E559 Vitamin D deficiency, unspecified: Secondary | ICD-10-CM

## 2021-06-02 MED ORDER — VITAMIN D (ERGOCALCIFEROL) 1.25 MG (50000 UNIT) PO CAPS: 50000.0000 [IU] | ORAL_CAPSULE | ORAL | 0 refills | Status: DC

## 2021-06-02 NOTE — Progress Notes (Signed)
Chief Complaint:   OBESITY [name] is here to discuss her progress with her obesity treatment plan along with follow-up of her obesity related diagnoses. [name] is on the Category 2 Plan with lunch options and states she is following her eating plan approximately 60% of the time. [name] states she is walking for 45 minutes 2 times per week.  Today's visit was #: 4 Starting weight: 228 lbs Starting date: 03/30/2021 Today's weight: 216 lbs Today's date: 06/02/2021 Total lbs lost to date: 12 Total lbs lost since last in-office visit: 5  Interim History: [name] continues to do well with weight loss on her plan. She did some celebration eating but she did well with portion control.  Subjective:   1. Vitamin D deficiency [name]'s Vit D is stable by not yet at goal. She requests a refill.  2. Insulin resistance [name] is doing well with decreasing simple carbohydrates in her diet. She denies GI upset with metformin.  3. At risk for impaired metabolic function [name] is at increased risk for impaired metabolic function if protein decreases.  Assessment/Plan:   1. Vitamin D deficiency Low Vitamin D level contributes to fatigue and are associated with obesity, breast, and colon cancer. We will refill prescription Vitamin D 50,000 IU every week #4 for 1 month. Chosen will follow-up for routine testing of Vitamin D, at least 2-3 times per year to avoid over-replacement.  2. Insulin resistance We will continue to manage metformin, and will monitor. [name] will continue to work on weight loss, exercise, and decreasing simple carbohydrates to help decrease the risk of diabetes. [name] agreed to follow-up with [place] as directed to closely monitor her progress.  3. At risk for impaired metabolic function [name] was given approximately 15 minutes of impaired  metabolic function prevention counseling today. We discussed intensive lifestyle modifications today with an emphasis on specific nutrition  and exercise instructions and strategies.   Repetitive spaced learning was employed today to elicit superior memory formation and behavioral change.  4. Obesity with current BMI of 34.9 [name] is currently in the action stage of change. As such, her goal is to continue with weight loss efforts. She has agreed to the Category 2 Plan.   Exercise goals: As is.  Behavioral modification strategies: increasing lean protein intake and holiday eating strategies .  [name] has agreed to follow-up with our clinic in 2 weeks. She was informed of the importance of frequent follow-up visits to maximize her success with intensive lifestyle modifications for her multiple health conditions.   Objective:   Blood pressure 113/72, pulse 85, temperature 98.4 F (36.9 C), height 5\' 6"  (1.676 m), weight 216 lb (98 kg), last menstrual period 05/07/2021, SpO2 99 %. Body mass index is 34.86 kg/m.  General: Cooperative, alert, well developed, in no acute distress. HEENT: Conjunctivae and lids unremarkable. Cardiovascular: Regular rhythm.  Lungs: Normal work of breathing. Neurologic: No focal deficits.   Lab Results  Component Value Date   CREATININE 0.94 03/30/2021   BUN 11 03/30/2021   NA 138 03/30/2021   K 4.2 03/30/2021   CL 102 03/30/2021   CO2 18 (L) 03/30/2021   Lab Results  Component Value Date   ALT 14 03/30/2021   AST 15 03/30/2021   ALKPHOS 46 03/30/2021   BILITOT 0.3 03/30/2021   Lab Results  Component Value Date   HGBA1C 5.0 03/30/2021   HGBA1C 5.1 12/14/2016   Lab Results  Component Value Date   INSULIN 11.5 03/30/2021  Lab Results  Component Value Date   TSH 1.540 03/30/2021   Lab Results  Component Value Date   CHOL 169 03/30/2021   HDL 48 03/30/2021   LDLCALC 102 (H) 03/30/2021   TRIG 103 03/30/2021   CHOLHDL 3 09/22/2020   Lab Results  Component Value Date   VD25OH 39.8 03/30/2021   Lab Results  Component Value Date   WBC 7.1 03/30/2021   HGB 14.4  03/30/2021   HCT 43.9 03/30/2021   MCV 89 03/30/2021   PLT 400.0 09/22/2020   No results found for: IRON, TIBC, FERRITIN  Attestation Statements:   Reviewed by clinician on day of visit: allergies, medications, problem list, medical history, surgical history, family history, social history, and previous encounter notes.   I, Burt Knack, am acting as transcriptionist for Quillian Quince, MD.  I have reviewed the above documentation for accuracy and completeness, and I agree with the above. -  Quillian Quince, MD

## 2021-06-15 ENCOUNTER — Telehealth: Payer: Commercial Managed Care - PPO | Admitting: Family Medicine

## 2021-06-15 ENCOUNTER — Ambulatory Visit
Admission: EM | Admit: 2021-06-15 | Discharge: 2021-06-15 | Disposition: A | Discharge status: Home / Self Care | Payer: Commercial Managed Care - PPO | Attending: Physician Assistant | Admitting: Physician Assistant

## 2021-06-15 ENCOUNTER — Other Ambulatory Visit: Payer: Self-pay

## 2021-06-15 ENCOUNTER — Ambulatory Visit (INDEPENDENT_AMBULATORY_CARE_PROVIDER_SITE_OTHER): Payer: Commercial Managed Care - PPO | Admitting: Family Medicine

## 2021-06-15 DIAGNOSIS — Z20828 Contact with and (suspected) exposure to other viral communicable diseases: Secondary | ICD-10-CM

## 2021-06-15 DIAGNOSIS — R6889 Other general symptoms and signs: Secondary | ICD-10-CM | POA: Diagnosis not present

## 2021-06-15 LAB — POCT INFLUENZA A/B
Influenza A, POC: NEGATIVE
Influenza B, POC: NEGATIVE

## 2021-06-15 MED ORDER — OSELTAMIVIR PHOSPHATE 75 MG PO CAPS: 75.0000 mg | ORAL_CAPSULE | Freq: Two times a day (BID) | ORAL | 0 refills | Status: DC

## 2021-06-15 NOTE — ED Provider Notes (Signed)
[facility]    CSN: [id number] Arrival date & time: 06/15/21  0947      History   Chief Complaint Chief Complaint  Patient presents with   Headache    HPI [name] is a 30 y.o. female.   Patient here today for evaluation of nasal congestion, cough, headache, body aches and chills that started 2 days ago. She has had known flu exposure. She denies any nausea, vomiting or diarrhea. She has tried OTC meds without significant relief.   The history is provided by the patient.  Headache Associated symptoms: congestion, cough, sinus pressure and sore throat   Associated symptoms: no abdominal pain, no diarrhea, no ear pain, no fever, no nausea and no vomiting    Past Medical History:  Diagnosis Date   ADD (attention deficit disorder with hyperactivity)    ADHD    Anxiety    per patient-treated by [facility].   Bipolar 1 disorder (HCC)    Depression    Eczema    as child   Endometriosis    HPV (human papilloma virus) infection    Pneumonia 2016   SOB (shortness of breath)     Patient Active Problem List   Diagnosis Date Noted   VENOUS INSUFFICIENCY, MILD 07/22/2009   DYSMENORRHEA 03/03/2008   ECZEMA, ATOPIC 07/15/2007    Past Surgical History:  Procedure Laterality Date   LAPAROSCOPIC OVARIAN CYSTECTOMY Right 03/10/2020   Procedure: LAPAROSCOPIC OVARIAN CYSTECTOMY;  Surgeon: Carrington Clamp, MD;  Location: [facility];  Service: Gynecology;  Laterality: Right;  POSSIBLE   LAPAROSCOPY Right 03/10/2020   Procedure: LAPAROSCOPY OPERATIVE AND CAUTERY OF ENDOMETRIOSIS;  Surgeon: Carrington Clamp, MD;  Location: [facility];  Service: Gynecology;  Laterality: Right;   TOE SURGERY Bilateral 2005   2 nd toes both sides    OB History     Gravida  0   Para  0   Term  0   Preterm  0   AB  0   Living  0      SAB  0   IAB  0   Ectopic  0   Multiple  0   Live Births  0            Home  Medications    Prior to Admission medications   Medication Sig Start Date End Date Taking? Authorizing Provider  oseltamivir (TAMIFLU) 75 MG capsule Take 1 capsule (75 mg total) by mouth every 12 (twelve) hours. 06/15/21  Yes Tomi Bamberger, PA-C  ADDERALL XR 20 MG 24 hr capsule  05/08/17   [provider]  brexpiprazole (REXULTI) 1 MG TABS tablet Take by mouth at bedtime.    [provider]  clonazePAM (KLONOPIN) 1 MG tablet Take 1 mg by mouth 2 (two) times daily as needed. 06/07/20   [provider]  fluticasone (FLONASE) 50 MCG/ACT nasal spray Place 1 spray into both nostrils 2 (two) times daily. Patient taking differently: Place 1 spray into both nostrils as needed. 08/11/19   [place], Betty G, MD  ibuprofen (ADVIL) 800 MG tablet Take 800 mg by mouth every 8 (eight) hours as needed.    [provider]  metFORMIN (GLUCOPHAGE) 500 MG tablet Take 1 tablet (500 mg total) by mouth daily with breakfast. 05/11/21   Quillian Quince D, MD  traZODone (DESYREL) 150 MG tablet Take 150 mg by mouth at bedtime.    [provider]  Vitamin D, Ergocalciferol, (DRISDOL)  1.25 MG (50000 UNIT) CAPS capsule Take 1 capsule (50,000 Units total) by mouth every 7 (seven) days. 06/02/21   Wilder Glade, MD    Family History Family History  Problem Relation Age of Onset   Bipolar disorder Mother    Drug abuse Mother    Obesity Mother    Breast cancer Paternal Grandmother 59   Cancer Other        breast   ADD / ADHD Other     Social History Social History   Tobacco Use   Smoking status: Never   Smokeless tobacco: Never  Vaping Use   Vaping Use: Some days   Substances: Nicotine, Flavoring  Substance Use Topics   Alcohol use: No   Drug use: No     Allergies   Patient has no known allergies.   Review of Systems Review of Systems  Constitutional:  Positive for chills and diaphoresis. Negative for fever.  HENT:  Positive for congestion, sinus pressure  and sore throat. Negative for ear pain.   Eyes:  Negative for discharge and redness.  Respiratory:  Positive for cough. Negative for shortness of breath and wheezing.   Gastrointestinal:  Negative for abdominal pain, diarrhea, nausea and vomiting.  Neurological:  Positive for headaches.    Physical Exam Triage Vital Signs ED Triage Vitals  Enc Vitals Group     BP 06/15/21 1119 114/77     Pulse Rate 06/15/21 1119 88     Resp 06/15/21 1119 (!) 98     Temp 06/15/21 1119 (!) 97.3 F (36.3 C)     Temp Source 06/15/21 1119 Oral     SpO2 06/15/21 1119 98 %     Weight --      Height --      Head Circumference --      Peak Flow --      Pain Score 06/15/21 1120 6     Pain Loc --      Pain Edu? --      Excl. in GC? --    No data found.  Updated Vital Signs BP 114/77 (BP Location: Left Arm)   Pulse 88   Temp (!) 97.3 F (36.3 C) (Oral)   Resp (!) 98   LMP 06/08/2021   SpO2 98%   Physical Exam Vitals and nursing note reviewed.  Constitutional:      General: She is not in acute distress.    Appearance: Normal appearance. She is not ill-appearing.  HENT:     Head: Normocephalic and atraumatic.     Nose: Congestion present.     Mouth/Throat:     Mouth: Mucous membranes are moist.     Pharynx: No oropharyngeal exudate or posterior oropharyngeal erythema.  Eyes:     Conjunctiva/sclera: Conjunctivae normal.  Cardiovascular:     Rate and Rhythm: Normal rate and regular rhythm.     Heart sounds: Normal heart sounds. No murmur heard. Pulmonary:     Effort: Pulmonary effort is normal. No respiratory distress.     Breath sounds: Normal breath sounds. No wheezing, rhonchi or rales.  Skin:    General: Skin is warm and dry.  Neurological:     Mental Status: She is alert.  Psychiatric:        Mood and Affect: Mood normal.        Thought Content: Thought content normal.     UC Treatments / Results  Labs (all labs ordered are listed, but only abnormal results are  displayed) Labs Reviewed  POCT INFLUENZA A/B    EKG   Radiology No results found.  Procedures Procedures (including critical care time)  Medications Ordered in UC Medications - No data to display  Initial Impression / Assessment and Plan / UC Course  I have reviewed the triage vital signs and the nursing notes.  Pertinent labs & imaging results that were available during my care of the patient were reviewed by me and considered in my medical decision making (see chart for details).   Flu test negative however given known exposure will treat to cover same with tamiflu. Recommend symptomatic treatment otherwise. Encouraged follow up with any further concerns.   Final Clinical Impressions(s) / UC Diagnoses   Final diagnoses:  Flu-like symptoms  Exposure to the flu   Discharge Instructions   None    ED Prescriptions     Medication Sig Dispense Auth. Provider   oseltamivir (TAMIFLU) 75 MG capsule Take 1 capsule (75 mg total) by mouth every 12 (twelve) hours. 10 capsule Tomi Bamberger, PA-C      PDMP not reviewed this encounter.   Tomi Bamberger, PA-C 06/15/21 1250

## 2021-06-15 NOTE — ED Triage Notes (Signed)
Pt c/o day 2 of cough, congestion, headache, chills, and body aches. States had a neg home covid test. States took ibuprofen 600mg  around 8am.

## 2021-06-16 ENCOUNTER — Ambulatory Visit: Payer: Self-pay

## 2021-07-04 ENCOUNTER — Other Ambulatory Visit: Payer: Self-pay

## 2021-07-04 ENCOUNTER — Ambulatory Visit (INDEPENDENT_AMBULATORY_CARE_PROVIDER_SITE_OTHER): Payer: Commercial Managed Care - PPO | Admitting: Family Medicine

## 2021-07-04 ENCOUNTER — Encounter (INDEPENDENT_AMBULATORY_CARE_PROVIDER_SITE_OTHER): Payer: Self-pay | Admitting: Family Medicine

## 2021-07-04 VITALS — BP 108/71 | HR 74 | Temp 98.6°F | Ht 66.0 in | Wt 216.0 lb

## 2021-07-04 DIAGNOSIS — Z6836 Body mass index (BMI) 36.0-36.9, adult: Secondary | ICD-10-CM

## 2021-07-04 DIAGNOSIS — E8881 Metabolic syndrome: Secondary | ICD-10-CM

## 2021-07-04 DIAGNOSIS — Z9189 Other specified personal risk factors, not elsewhere classified: Secondary | ICD-10-CM | POA: Diagnosis not present

## 2021-07-04 DIAGNOSIS — E559 Vitamin D deficiency, unspecified: Secondary | ICD-10-CM | POA: Diagnosis not present

## 2021-07-04 MED ORDER — VITAMIN D (ERGOCALCIFEROL) 1.25 MG (50000 UNIT) PO CAPS: 50000.0000 [IU] | ORAL_CAPSULE | ORAL | 0 refills | Status: DC

## 2021-07-04 NOTE — Progress Notes (Signed)
Chief Complaint:   OBESITY [name] is here to discuss her progress with her obesity treatment plan along with follow-up of her obesity related diagnoses. [name] is on the Category 2 Plan and states she is following her eating plan approximately 60% of the time. [name] states she is walking for 30 minutes 2 times per week.  Today's visit was #: 5 Starting weight: 228 lbs Starting date: 03/30/2021 Today's weight: 216 lbs Today's date: 07/04/2021 Total lbs lost to date: 12 Total lbs lost since last in-office visit: 0  Interim History: [name] has done well with avoiding holiday weight gain. Her hunger is mostly controlled, and she is working on meal planning and prepping.  Subjective:   1. Vitamin D deficiency [name] is stable on Vit D, but her level is not yet at goal. She denies nausea, vomiting, or muscle weakness.  2. Insulin resistance [name] is working on decreasing simple carbohydrates. She is stable on metformin. She denies nausea, vomiting, or hypoglycemia.  3. At risk for impaired metabolic function [name] is at increased risk for impaired metabolic function due to current nutrition and muscle mass.  Assessment/Plan:   1. Vitamin D deficiency Low Vitamin D level contributes to fatigue and are associated with obesity, breast, and colon cancer. We will refill prescription Vitamin D for 1 month. [name] will follow-up for routine testing of Vitamin D, at least 2-3 times per year to avoid over-replacement.  - Vitamin D, Ergocalciferol, (DRISDOL) 1.25 MG (50000 UNIT) CAPS capsule; Take 1 capsule (50,000 Units total) by mouth every 7 (seven) days.  Dispense: 4 capsule; Refill: 0  2. Insulin resistance [name] will continue metformin, and we will recheck labs in 1 month. [name] agreed to follow-up with [place] as directed to closely monitor her progress.  3. At risk for impaired metabolic function [name] was given approximately 15 minutes of impaired  metabolic function prevention  counseling today. We discussed intensive lifestyle modifications today with an emphasis on specific nutrition and exercise instructions and strategies.   Repetitive spaced learning was employed today to elicit superior memory formation and behavioral change.  4. Obesity with current BMI of 35 [name] is currently in the action stage of change. As such, her goal is to continue with weight loss efforts. She has agreed to the Category 2 Plan.   Exercise goals: As is.  Behavioral modification strategies: meal planning and cooking strategies.  [name] has agreed to follow-up with our clinic in 2 weeks. She was informed of the importance of frequent follow-up visits to maximize her success with intensive lifestyle modifications for her multiple health conditions.   Objective:   Blood pressure 108/71, pulse 74, temperature 98.6 F (37 C), height 5\' 6"  (1.676 m), weight 216 lb (98 kg), last menstrual period 06/08/2021, SpO2 99 %. Body mass index is 34.86 kg/m.  General: Cooperative, alert, well developed, in no acute distress. HEENT: Conjunctivae and lids unremarkable. Cardiovascular: Regular rhythm.  Lungs: Normal work of breathing. Neurologic: No focal deficits.   Lab Results  Component Value Date   CREATININE 0.94 03/30/2021   BUN 11 03/30/2021   NA 138 03/30/2021   K 4.2 03/30/2021   CL 102 03/30/2021   CO2 18 (L) 03/30/2021   Lab Results  Component Value Date   ALT 14 03/30/2021   AST 15 03/30/2021   ALKPHOS 46 03/30/2021   BILITOT 0.3 03/30/2021   Lab Results  Component Value Date   HGBA1C 5.0 03/30/2021   HGBA1C 5.1 12/14/2016  Lab Results  Component Value Date   INSULIN 11.5 03/30/2021   Lab Results  Component Value Date   TSH 1.540 03/30/2021   Lab Results  Component Value Date   CHOL 169 03/30/2021   HDL 48 03/30/2021   LDLCALC 102 (H) 03/30/2021   TRIG 103 03/30/2021   CHOLHDL 3 09/22/2020   Lab Results  Component Value Date   VD25OH 39.8 03/30/2021    Lab Results  Component Value Date   WBC 7.1 03/30/2021   HGB 14.4 03/30/2021   HCT 43.9 03/30/2021   MCV 89 03/30/2021   PLT 400.0 09/22/2020   No results found for: IRON, TIBC, FERRITIN  Attestation Statements:   Reviewed by clinician on day of visit: allergies, medications, problem list, medical history, surgical history, family history, social history, and previous encounter notes.   I, Burt Knack, am acting as transcriptionist for Quillian Quince, MD.  I have reviewed the above documentation for accuracy and completeness, and I agree with the above. -  Quillian Quince, MD

## 2021-07-08 ENCOUNTER — Other Ambulatory Visit: Payer: Self-pay

## 2021-07-08 ENCOUNTER — Ambulatory Visit
Admission: RE | Admit: 2021-07-08 | Discharge: 2021-07-08 | Disposition: A | Discharge status: Home / Self Care | Payer: Commercial Managed Care - PPO | Source: Ambulatory Visit | Attending: Physician Assistant | Admitting: Physician Assistant

## 2021-07-08 VITALS — BP 116/77 | HR 92 | Temp 98.6°F | Resp 18

## 2021-07-08 DIAGNOSIS — J069 Acute upper respiratory infection, unspecified: Secondary | ICD-10-CM | POA: Diagnosis not present

## 2021-07-08 DIAGNOSIS — Z20828 Contact with and (suspected) exposure to other viral communicable diseases: Secondary | ICD-10-CM | POA: Diagnosis not present

## 2021-07-08 MED ORDER — OSELTAMIVIR PHOSPHATE 75 MG PO CAPS: 75.0000 mg | ORAL_CAPSULE | Freq: Two times a day (BID) | ORAL | 0 refills | Status: DC

## 2021-07-08 NOTE — ED Provider Notes (Signed)
[facility]    CSN: [id number] Arrival date & time: 07/08/21  1354      History   Chief Complaint Chief Complaint  Patient presents with   Cough    HPI [name] is a 30 y.o. female.   Patient here today for evaluation of cough, congestion, body aches that started yesterday.  She states she is also had fever, headache and nausea.  She denies any vomiting.  She reports that her daughter had flu last week.  She has tried over-the-counter medication without significant relief.  The history is provided by the patient.  Cough Associated symptoms: chills, fever and sore throat   Associated symptoms: no ear pain, no eye discharge, no shortness of breath and no wheezing    Past Medical History:  Diagnosis Date   ADD (attention deficit disorder with hyperactivity)    ADHD    Anxiety    per patient-treated by [facility].   Bipolar 1 disorder (HCC)    Depression    Eczema    as child   Endometriosis    HPV (human papilloma virus) infection    Pneumonia 2016   SOB (shortness of breath)     Patient Active Problem List   Diagnosis Date Noted   VENOUS INSUFFICIENCY, MILD 07/22/2009   DYSMENORRHEA 03/03/2008   ECZEMA, ATOPIC 07/15/2007    Past Surgical History:  Procedure Laterality Date   LAPAROSCOPIC OVARIAN CYSTECTOMY Right 03/10/2020   Procedure: LAPAROSCOPIC OVARIAN CYSTECTOMY;  Surgeon: Carrington Clamp, MD;  Location: [facility];  Service: Gynecology;  Laterality: Right;  POSSIBLE   LAPAROSCOPY Right 03/10/2020   Procedure: LAPAROSCOPY OPERATIVE AND CAUTERY OF ENDOMETRIOSIS;  Surgeon: Carrington Clamp, MD;  Location: [facility];  Service: Gynecology;  Laterality: Right;   TOE SURGERY Bilateral 2005   2 nd toes both sides    OB History     Gravida  0   Para  0   Term  0   Preterm  0   AB  0   Living  0      SAB  0   IAB  0   Ectopic  0   Multiple  0   Live Births  0            Home  Medications    Prior to Admission medications   Medication Sig Start Date End Date Taking? Authorizing Provider  ADDERALL XR 20 MG 24 hr capsule  05/08/17   [provider]  brexpiprazole (REXULTI) 1 MG TABS tablet Take by mouth at bedtime.    [provider]  clonazePAM (KLONOPIN) 1 MG tablet Take 1 mg by mouth 2 (two) times daily as needed. 06/07/20   [provider]  fluticasone (FLONASE) 50 MCG/ACT nasal spray Place 1 spray into both nostrils 2 (two) times daily. Patient taking differently: Place 1 spray into both nostrils as needed. 08/11/19   [place], Betty G, MD  ibuprofen (ADVIL) 800 MG tablet Take 800 mg by mouth every 8 (eight) hours as needed.    [provider]  metFORMIN (GLUCOPHAGE) 500 MG tablet Take 1 tablet (500 mg total) by mouth daily with breakfast. 05/11/21   Quillian Quince D, MD  oseltamivir (TAMIFLU) 75 MG capsule Take 1 capsule (75 mg total) by mouth every 12 (twelve) hours. 07/08/21   Tomi Bamberger, PA-C  traZODone (DESYREL) 150 MG tablet Take 150 mg by mouth at bedtime.    [provider]  Vitamin  D, Ergocalciferol, (DRISDOL) 1.25 MG (50000 UNIT) CAPS capsule Take 1 capsule (50,000 Units total) by mouth every 7 (seven) days. 07/04/21   Wilder Glade, MD    Family History Family History  Problem Relation Age of Onset   Bipolar disorder Mother    Drug abuse Mother    Obesity Mother    Breast cancer Paternal Grandmother 77   Cancer Other        breast   ADD / ADHD Other     Social History Social History   Tobacco Use   Smoking status: Never   Smokeless tobacco: Never  Vaping Use   Vaping Use: Some days   Substances: Nicotine, Flavoring  Substance Use Topics   Alcohol use: No   Drug use: No     Allergies   Patient has no known allergies.   Review of Systems Review of Systems  Constitutional:  Positive for chills and fever.  HENT:  Positive for congestion and sore throat. Negative for ear pain.    Eyes:  Negative for discharge and redness.  Respiratory:  Positive for cough. Negative for shortness of breath and wheezing.   Gastrointestinal:  Negative for abdominal pain, diarrhea, nausea and vomiting.    Physical Exam Triage Vital Signs ED Triage Vitals [07/08/21 1437]  Enc Vitals Group     BP 116/77     Pulse Rate 92     Resp 18     Temp 98.6 F (37 C)     Temp Source Oral     SpO2 98 %     Weight      Height      Head Circumference      Peak Flow      Pain Score 0     Pain Loc      Pain Edu?      Excl. in GC?    No data found.  Updated Vital Signs BP 116/77 (BP Location: Left Arm)   Pulse 92   Temp 98.6 F (37 C) (Oral)   Resp 18   LMP 06/08/2021   SpO2 98%   Physical Exam Vitals and nursing note reviewed.  Constitutional:      General: She is not in acute distress.    Appearance: Normal appearance. She is not ill-appearing.  HENT:     Head: Normocephalic and atraumatic.     Nose: Congestion present.     Mouth/Throat:     Mouth: Mucous membranes are moist.     Pharynx: Oropharynx is clear. No oropharyngeal exudate or posterior oropharyngeal erythema.  Eyes:     Conjunctiva/sclera: Conjunctivae normal.  Cardiovascular:     Rate and Rhythm: Normal rate and regular rhythm.     Heart sounds: Normal heart sounds. No murmur heard. Pulmonary:     Effort: Pulmonary effort is normal. No respiratory distress.     Breath sounds: No wheezing, rhonchi or rales.  Neurological:     Mental Status: She is alert.  Psychiatric:        Mood and Affect: Mood normal.        Behavior: Behavior normal.     UC Treatments / Results  Labs (all labs ordered are listed, but only abnormal results are displayed) Labs Reviewed  COVID-19, FLU A+B NAA    EKG   Radiology No results found.  Procedures Procedures (including critical care time)  Medications Ordered in UC Medications - No data to display  Initial Impression / Assessment and Plan / UC  Course  I  have reviewed the triage vital signs and the nursing notes.  Pertinent labs & imaging results that were available during my care of the patient were reviewed by me and considered in my medical decision making (see chart for details).  Screening ordered for COVID and flu.  Will treat with Tamiflu given known exposure.  Recommend symptomatic treatment otherwise, increase fluids and rest.  Encouraged follow-up with any further concerns.  Final Clinical Impressions(s) / UC Diagnoses   Final diagnoses:  Acute upper respiratory infection  Exposure to the flu   Discharge Instructions   None    ED Prescriptions     Medication Sig Dispense Auth. Provider   oseltamivir (TAMIFLU) 75 MG capsule  (Status: Discontinued) Take 1 capsule (75 mg total) by mouth every 12 (twelve) hours. 10 capsule Tomi Bamberger, PA-C   oseltamivir (TAMIFLU) 75 MG capsule Take 1 capsule (75 mg total) by mouth every 12 (twelve) hours. 10 capsule Tomi Bamberger, PA-C      PDMP not reviewed this encounter.   Tomi Bamberger, PA-C 07/08/21 1550

## 2021-07-08 NOTE — ED Triage Notes (Signed)
Pt c/o headache, sore throat, cough, body aches, fever (at home), chills, nasal congestion with drainage, nausea.   Denies ear ache, vomiting, diarrhea, constipation.   Onset yesterday

## 2021-07-09 LAB — COVID-19, FLU A+B NAA
Influenza A, NAA: NOT DETECTED
Influenza B, NAA: NOT DETECTED
SARS-CoV-2, NAA: NOT DETECTED

## 2021-07-18 ENCOUNTER — Ambulatory Visit (INDEPENDENT_AMBULATORY_CARE_PROVIDER_SITE_OTHER): Payer: Commercial Managed Care - PPO | Admitting: Family Medicine

## 2021-07-18 ENCOUNTER — Other Ambulatory Visit: Payer: Self-pay

## 2021-07-18 ENCOUNTER — Encounter (INDEPENDENT_AMBULATORY_CARE_PROVIDER_SITE_OTHER): Payer: Self-pay | Admitting: Family Medicine

## 2021-07-18 VITALS — BP 105/69 | HR 66 | Temp 98.1°F | Ht 66.0 in | Wt 216.0 lb

## 2021-07-18 DIAGNOSIS — Z6836 Body mass index (BMI) 36.0-36.9, adult: Secondary | ICD-10-CM | POA: Diagnosis not present

## 2021-07-18 DIAGNOSIS — E559 Vitamin D deficiency, unspecified: Secondary | ICD-10-CM | POA: Diagnosis not present

## 2021-07-18 DIAGNOSIS — R7303 Prediabetes: Secondary | ICD-10-CM | POA: Diagnosis not present

## 2021-07-19 NOTE — Progress Notes (Signed)
Chief Complaint:   OBESITY [name] is here to discuss her progress with her obesity treatment plan along with follow-up of her obesity related diagnoses. [name] is on the Category 2 Plan and states she is following her eating plan approximately 70% of the time. [name] states she is walking for 30 minutes 2 times per week.  Today's visit was #: 6 Starting weight: 228 lbs Starting date: 03/30/2021 Today's weight: 216 lbs Today's date: 07/18/2021 Total lbs lost to date: 12 Total lbs lost since last in-office visit: 0  Interim History: [name] has done well maintaining her weight during the holiday season. She has a lot of celebrating eating situations this week, and she is planning on "damage control" strategies.  Subjective:   1. Pre-diabetes [name] continues to do well with metformin. She takes it with a meal and she has no side effects noted.  2. Vitamin D deficiency [name] is on Vit D, with no side effects noted. Her last labs were not yet at goal.  Assessment/Plan:   1. Pre-diabetes [name] will continue with diet, exercise, and metformin to help decrease the risk of diabetes. We will recheck labs in 3 weeks.  2. Vitamin D deficiency [name] will continue prescription Vitamin D 50,000 IU every week. We will recheck labs in 3 weeks, and she will follow-up for routine testing of Vitamin D, at least 2-3 times per year to avoid over-replacement.  3. Obesity with current BMI of 34.9 [name] is currently in the action stage of change. As such, her goal is to continue with weight loss efforts. She has agreed to the Category 2 Plan.   We will recheck fasting labs at her next visit.  Exercise goals: As is.  Behavioral modification strategies: holiday eating strategies  and celebration eating strategies.  [name] has agreed to follow-up with our clinic in 3 weeks. She was informed of the importance of frequent follow-up visits to maximize her success with intensive lifestyle  modifications for her multiple health conditions.   Objective:   Blood pressure 105/69, pulse 66, temperature 98.1 F (36.7 C), height 5\' 6"  (1.676 m), weight 216 lb (98 kg), SpO2 99 %. Body mass index is 34.86 kg/m.  General: Cooperative, alert, well developed, in no acute distress. HEENT: Conjunctivae and lids unremarkable. Cardiovascular: Regular rhythm.  Lungs: Normal work of breathing. Neurologic: No focal deficits.   Lab Results  Component Value Date   CREATININE 0.94 03/30/2021   BUN 11 03/30/2021   NA 138 03/30/2021   K 4.2 03/30/2021   CL 102 03/30/2021   CO2 18 (L) 03/30/2021   Lab Results  Component Value Date   ALT 14 03/30/2021   AST 15 03/30/2021   ALKPHOS 46 03/30/2021   BILITOT 0.3 03/30/2021   Lab Results  Component Value Date   HGBA1C 5.0 03/30/2021   HGBA1C 5.1 12/14/2016   Lab Results  Component Value Date   INSULIN 11.5 03/30/2021   Lab Results  Component Value Date   TSH 1.540 03/30/2021   Lab Results  Component Value Date   CHOL 169 03/30/2021   HDL 48 03/30/2021   LDLCALC 102 (H) 03/30/2021   TRIG 103 03/30/2021   CHOLHDL 3 09/22/2020   Lab Results  Component Value Date   VD25OH 39.8 03/30/2021   Lab Results  Component Value Date   WBC 7.1 03/30/2021   HGB 14.4 03/30/2021   HCT 43.9 03/30/2021   MCV 89 03/30/2021   PLT 400.0 09/22/2020   No  results found for: IRON, TIBC, FERRITIN  Attestation Statements:   Reviewed by clinician on day of visit: allergies, medications, problem list, medical history, surgical history, family history, social history, and previous encounter notes.  Time spent on visit including pre-visit chart review and post-visit care and charting was 30 minutes.    I, Burt Knack, am acting as transcriptionist for Quillian Quince, MD.  I have reviewed the above documentation for accuracy and completeness, and I agree with the above. -  Quillian Quince, MD

## 2021-07-31 NOTE — L&D Delivery Note (Signed)
Operative Delivery Note She progressed to complete and pushed for around an hour and 30 minutes, then requested assistance due to fatigue and pelvic pressure.  At 1:16 PM a viable female was delivered via Vaginal, Vacuum Investment banker, operational).  Presentation: vertex; Position: Occiput,, Posterior; Station: +2.  Verbal consent: obtained from patient.  Risks and benefits discussed in detail.  Risks include, but are not limited to the risks of anesthesia, bleeding, infection, damage to maternal tissues, fetal cephalhematoma.  There is also the risk of inability to effect vaginal delivery of the head, or shoulder dystocia that cannot be resolved by established maneuvers, leading to the need for emergency cesarean section.  Vacuum delivery accomplished with vacuum in green zone and pushes with 3 ctx  APGAR: 9, 9; weight pending.   Placenta status: spontaneous, intact.   Cord:  with the following complications: none  Anesthesia:  Epidural, local for repair Instruments: Mushroom cup Episiotomy: None Lacerations: 2nd degree Suture Repair: 3.0 vicryl rapide Est. Blood Loss (mL): 153  Mom to postpartum.  Baby to Couplet care / Skin to Skin.  They would like circumcision for baby, questions answered.  Leighton Roach Janely Gullickson 06/30/2022, 1:41 PM

## 2021-08-15 ENCOUNTER — Encounter (INDEPENDENT_AMBULATORY_CARE_PROVIDER_SITE_OTHER): Payer: Self-pay | Admitting: Family Medicine

## 2021-08-15 ENCOUNTER — Other Ambulatory Visit: Payer: Self-pay

## 2021-08-15 ENCOUNTER — Ambulatory Visit (INDEPENDENT_AMBULATORY_CARE_PROVIDER_SITE_OTHER): Payer: Commercial Managed Care - PPO | Admitting: Family Medicine

## 2021-08-15 VITALS — BP 109/73 | HR 67 | Temp 98.0°F | Ht 66.0 in | Wt 227.0 lb

## 2021-08-15 DIAGNOSIS — E7849 Other hyperlipidemia: Secondary | ICD-10-CM

## 2021-08-15 DIAGNOSIS — E669 Obesity, unspecified: Secondary | ICD-10-CM

## 2021-08-15 DIAGNOSIS — E8881 Metabolic syndrome: Secondary | ICD-10-CM | POA: Diagnosis not present

## 2021-08-15 DIAGNOSIS — Z9189 Other specified personal risk factors, not elsewhere classified: Secondary | ICD-10-CM

## 2021-08-15 DIAGNOSIS — Z6836 Body mass index (BMI) 36.0-36.9, adult: Secondary | ICD-10-CM

## 2021-08-15 DIAGNOSIS — E538 Deficiency of other specified B group vitamins: Secondary | ICD-10-CM | POA: Diagnosis not present

## 2021-08-15 DIAGNOSIS — E559 Vitamin D deficiency, unspecified: Secondary | ICD-10-CM

## 2021-08-15 MED ORDER — METFORMIN HCL 500 MG PO TABS: ORAL_TABLET | ORAL | 0 refills | Status: DC

## 2021-08-15 MED ORDER — VITAMIN D (ERGOCALCIFEROL) 1.25 MG (50000 UNIT) PO CAPS: 50000.0000 [IU] | ORAL_CAPSULE | ORAL | 0 refills | Status: DC

## 2021-08-16 LAB — VITAMIN D 25 HYDROXY (VIT D DEFICIENCY, FRACTURES): Vit D, 25-Hydroxy: 41.3 ng/mL (ref 30.0–100.0)

## 2021-08-16 LAB — INSULIN, RANDOM: INSULIN: 7.5 u[IU]/mL (ref 2.6–24.9)

## 2021-08-16 LAB — LIPID PANEL
Chol/HDL Ratio: 3 ratio (ref 0.0–4.4)
Cholesterol, Total: 183 mg/dL (ref 100–199)
HDL: 61 mg/dL (ref >39)
LDL Chol Calc (NIH): 101 mg/dL — ABNORMAL HIGH (ref 0–99)
Triglycerides: 121 mg/dL (ref 0–149)
VLDL Cholesterol Cal: 21 mg/dL (ref 5–40)

## 2021-08-16 LAB — HEMOGLOBIN A1C
Est. average glucose Bld gHb Est-mCnc: 94 mg/dL
Hgb A1c MFr Bld: 4.9 % (ref 4.8–5.6)

## 2021-08-16 LAB — VITAMIN B12: Vitamin B-12: 416 pg/mL (ref 232–1245)

## 2021-08-16 NOTE — Progress Notes (Signed)
Chief Complaint:   OBESITY [name] is here to discuss her progress with her obesity treatment plan along with follow-up of her obesity related diagnoses. [name] is on the Category 2 Plan and states she is following her eating plan approximately 50% of the time. [name] states she is walking for 30 minutes 2 times per week.  Today's visit was #: 7 Starting weight: 228 lbs Starting date: 03/30/2021 Today's weight: 227 lbs Today's date: 08/15/2021 Total lbs lost to date: 1 Total lbs lost since last in-office visit: 0  Interim History: [name] had a lot of celebration eating events over the holidays. She is ready to get back on the plan and even bought her food. She has a 33 year old, and often forgets breakfast and lunch with caring for her and working. She often indulges at dinner time.  Subjective:   1. Vitamin D deficiency [name] was started on Vit D supplementation at her second office visit. She is taking it as prescribed, and she is tolerating it well with no side effects.  2. B12 deficiency [name] has been following the meal plan. She was diagnosed with recent lab work on her first office visit with [place]. Her energy levels has changed very little at this point.  3. Insulin resistance [name] was started on metformin at her second office visit, and she is tolerating it well. She takes it every morning, but with Adderall every morning. She has no hunger or cravings until late in the day.  4. Other hyperlipidemia [name] has a slightly elevated LDL, and her treatment has been the meal plan and no medications needed.  5. At risk for deficient intake of food [name] is at a higher than average risk of deficient intake of food due to current habits.  Assessment/Plan:   Orders Placed This Encounter  Procedures   Vitamin B12   VITAMIN D 25 Hydroxy (Vit-D Deficiency, Fractures)   Lipid panel   Hemoglobin A1c   Insulin, random    Medications Discontinued During This Encounter   Medication Reason   metFORMIN (GLUCOPHAGE) 500 MG tablet Reorder   Vitamin D, Ergocalciferol, (DRISDOL) 1.25 MG (50000 UNIT) CAPS capsule Reorder     Meds ordered this encounter  Medications   metFORMIN (GLUCOPHAGE) 500 MG tablet    Sig: 1 po with lunch daily    Dispense:  30 tablet    Refill:  0   Vitamin D, Ergocalciferol, (DRISDOL) 1.25 MG (50000 UNIT) CAPS capsule    Sig: Take 1 capsule (50,000 Units total) by mouth every 7 (seven) days.    Dispense:  4 capsule    Refill:  0     1. Vitamin D deficiency We will check labs today, and we will refill prescription Vitamin D for 1 month. Locklyn will follow-up for routine testing of Vitamin D, at least 2-3 times per year to avoid over-replacement.  - Vitamin D, Ergocalciferol, (DRISDOL) 1.25 MG (50000 UNIT) CAPS capsule; Take 1 capsule (50,000 Units total) by mouth every 7 (seven) days.  Dispense: 4 capsule; Refill: 0 - VITAMIN D 25 Hydroxy (Vit-D Deficiency, Fractures)  2. B12 deficiency The diagnosis was reviewed with the patient. Counseling provided today, see below. We will check labs today. [name] will continue her prudent nutritional plan with B12 rich food. Orders and follow up as documented in patient record.  Counseling The body needs vitamin B12: to make red blood cells; to make DNA; and to help the nerves work properly so they can carry  messages from the brain to the body.  The main causes of vitamin B12 deficiency include dietary deficiency, digestive diseases, pernicious anemia, and having a surgery in which part of the stomach or small intestine is removed.  Certain medicines can make it harder for the body to absorb vitamin B12. These medicines include: heartburn medications; some antibiotics; some medications used to treat diabetes, gout, and high cholesterol.  In some cases, there are no symptoms of this condition. If the condition leads to anemia or nerve damage, various symptoms can occur, such as weakness or fatigue,  shortness of breath, and numbness or tingling in your hands and feet.   Treatment:  May include taking vitamin B12 supplements.  Avoid alcohol.  Eat lots of healthy foods that contain vitamin B12: Beef, pork, chicken, [place], and organ meats, such as liver.  Seafood: This includes clams, rainbow trout, salmon, tuna, and haddock. Eggs.  Cereal and dairy products that are fortified: This means that vitamin B12 has been added to the food.   - Vitamin B12  3. Insulin resistance We will check labs today, and we will refill metformin for 1 month. [name] is to take metformin 1 tablet PO at lunch (instead of breakfast). She will continue her prudent nutritional plan and weight loss. [name] agreed to follow-up with [place] as directed to closely monitor her progress.  - metFORMIN (GLUCOPHAGE) 500 MG tablet; 1 po with lunch daily  Dispense: 30 tablet; Refill: 0 - Hemoglobin A1c - Insulin, random  4. Other hyperlipidemia Cardiovascular risk and specific lipid/LDL goals reviewed. We will check labs today. [name] will increase her exercise, decrease saturated and trans fast by following her meal plan. Orders and follow up as documented in patient record.   Counseling Intensive lifestyle modifications are the first line treatment for this issue. Dietary changes: Increase soluble fiber. Decrease simple carbohydrates. Exercise changes: Moderate to vigorous-intensity aerobic activity 150 minutes per week if tolerated. Lipid-lowering medications: see documented in medical record.  - Lipid panel  5. At risk for deficient intake of food [name] was given approximately 9 minutes of deficit intake of food prevention counseling today. [name] is at risk for eating too few calories based on current food recall. She was encouraged to focus on meeting caloric and protein goals according to her recommended meal plan.   6. Obesity with current BMI of 36.6 [name] is currently in the action stage of change. As such, her  goal is to continue with weight loss efforts. She has agreed to the Category 2 Plan and keeping a food journal and adhering to recommended goals of 200-350 calories and 20+ grams of protein.   Exercise goals: Go to the gym 3+ days per week.  Behavioral modification strategies: no skipping meals, meal planning and cooking strategies, and planning for success.  [name] has agreed to follow-up with our clinic in 2 weeks. She was informed of the importance of frequent follow-up visits to maximize her success with intensive lifestyle modifications for her multiple health conditions.   [name] was informed we would discuss her lab results at her next visit unless there is a critical issue that needs to be addressed sooner. [name] agreed to keep her next visit at the agreed upon time to discuss these results.  Objective:   Blood pressure 109/73, pulse 67, temperature 98 F (36.7 C), height 5\' 6"  (1.676 m), weight 227 lb (103 kg), SpO2 99 %. Body mass index is 36.64 kg/m.  General: Cooperative, alert, well developed, in no acute  distress. HEENT: Conjunctivae and lids unremarkable. Cardiovascular: Regular rhythm.  Lungs: Normal work of breathing. Neurologic: No focal deficits.   Lab Results  Component Value Date   CREATININE 0.94 03/30/2021   BUN 11 03/30/2021   NA 138 03/30/2021   K 4.2 03/30/2021   CL 102 03/30/2021   CO2 18 (L) 03/30/2021   Lab Results  Component Value Date   ALT 14 03/30/2021   AST 15 03/30/2021   ALKPHOS 46 03/30/2021   BILITOT 0.3 03/30/2021   Lab Results  Component Value Date   HGBA1C 4.9 08/15/2021   HGBA1C 5.0 03/30/2021   HGBA1C 5.1 12/14/2016   Lab Results  Component Value Date   INSULIN WILL FOLLOW 08/15/2021   INSULIN 11.5 03/30/2021   Lab Results  Component Value Date   TSH 1.540 03/30/2021   Lab Results  Component Value Date   CHOL 183 08/15/2021   HDL 61 08/15/2021   LDLCALC 101 (H) 08/15/2021   TRIG 121 08/15/2021   CHOLHDL 3.0  08/15/2021   Lab Results  Component Value Date   VD25OH 41.3 08/15/2021   VD25OH 39.8 03/30/2021   Lab Results  Component Value Date   WBC 7.1 03/30/2021   HGB 14.4 03/30/2021   HCT 43.9 03/30/2021   MCV 89 03/30/2021   PLT 400.0 09/22/2020   No results found for: IRON, TIBC, FERRITIN  Attestation Statements:   Reviewed by clinician on day of visit: allergies, medications, problem list, medical history, surgical history, family history, social history, and previous encounter notes.   Trude McburneyI, Sharon Martin, am acting as transcriptionist for Marsh & McLennanDeborah Amberlin Utke, DO.  I have reviewed the above documentation for accuracy and completeness, and I agree with the above. Carlye Grippe-  Daisa Stennis J Sharene Krikorian, D.O.  The 21st Century Cures Act was signed into law in 2016 which includes the topic of electronic health records.  This provides immediate access to information in MyChart.  This includes consultation notes, operative notes, office notes, lab results and pathology reports.  If you have any questions about what you read please let us know at your next visit so we can discuss your concerns and take corrective action if need be.  We are right here with you.

## 2021-08-23 ENCOUNTER — Ambulatory Visit (INDEPENDENT_AMBULATORY_CARE_PROVIDER_SITE_OTHER): Payer: Commercial Managed Care - PPO | Admitting: Family Medicine

## 2021-08-29 ENCOUNTER — Other Ambulatory Visit: Payer: Self-pay

## 2021-08-29 ENCOUNTER — Encounter (INDEPENDENT_AMBULATORY_CARE_PROVIDER_SITE_OTHER): Payer: Self-pay | Admitting: Family Medicine

## 2021-08-29 ENCOUNTER — Ambulatory Visit (INDEPENDENT_AMBULATORY_CARE_PROVIDER_SITE_OTHER): Payer: Commercial Managed Care - PPO | Admitting: Family Medicine

## 2021-08-29 VITALS — BP 116/78 | HR 86 | Temp 98.3°F | Ht 66.0 in | Wt 223.0 lb

## 2021-08-29 DIAGNOSIS — Z6836 Body mass index (BMI) 36.0-36.9, adult: Secondary | ICD-10-CM

## 2021-08-29 DIAGNOSIS — E538 Deficiency of other specified B group vitamins: Secondary | ICD-10-CM | POA: Diagnosis not present

## 2021-08-29 DIAGNOSIS — E7849 Other hyperlipidemia: Secondary | ICD-10-CM

## 2021-08-29 DIAGNOSIS — E8881 Metabolic syndrome: Secondary | ICD-10-CM

## 2021-08-29 DIAGNOSIS — E669 Obesity, unspecified: Secondary | ICD-10-CM

## 2021-08-29 DIAGNOSIS — Z9189 Other specified personal risk factors, not elsewhere classified: Secondary | ICD-10-CM

## 2021-08-29 DIAGNOSIS — E559 Vitamin D deficiency, unspecified: Secondary | ICD-10-CM | POA: Insufficient documentation

## 2021-08-30 NOTE — Progress Notes (Signed)
Chief Complaint:   OBESITY [name] is here to discuss her progress with her obesity treatment plan along with follow-up of her obesity related diagnoses. [name] is on the Category 2 Plan and states she is following her eating plan approximately 90% of the time. [name] states she is going to the gym 60 minutes 3 times per week.  Today's visit was #: 8 Starting weight: 228 lbs Starting date: 03/30/2021 Today's weight: 223 lbs Today's date: 08/29/2021 Total lbs lost to date: 5 Total lbs lost since last in-office visit: 4  Interim History: Pt started eating more foods on plan. She skipped less meals and ate on plan. She denies hunger or cravings. Pt feels better when she eats- "less sludge in stomach and feels cleaner". She increased exercise as well.   Subjective:   1. Insulin resistance Discussed labs with patient today. Improved A1c to 4.9 and insulin level from 11.5 to 7.5.  2. Vitamin D deficiency Discussed labs with patient today. Level improved from prior but is still not at goal.  3. Other hyperlipidemia Discussed labs with patient today. Improved HDL from 48 to 61! Great job. LDL is minimally improved at 101.  4. Deficiency of vitamin B12 Discussed labs with patient today. Improved B12 to 416 with prudent nutritional plan. Pt's energy has improved.  5. At risk for constipation [name] is at risk for constipation due to eating more foods on plan, especially proteins.  Assessment/Plan:   1. Insulin resistance Continue Metformin.  Fasting insulin is still elevated but better - I reiterated and again counseled patient on pathophysiology of the disease process of I.R. - Stressed importance of dietary and lifestyle modifications to result in weight loss as first line txmnt - continue to decrease simple carbs; increase fiber and proteins -> follow meal plan  - handouts provided at pt's request after education provided.  All concerns/questions addressed.   - anticipatory  guidance given.   - We will recheck A1c and fasting insulin level in approximately 3 months    2. Vitamin D deficiency Low Vitamin D level contributes to fatigue and are associated with obesity, breast, and colon cancer. She agrees to continue to take prescription Vitamin D @50 ,000 IU every week and will follow-up for routine testing of Vitamin D, at least 2-3 times per year to avoid over-replacement.   3. Other hyperlipidemia [name] reports compliance with meds and/or treatment plan such as low saturated and trans fat low cholesterol meal plan - Rec: aerobic activity with eventual goal of a minimum of 150+ min wk plus 2 days/ week of resistance strength training - Cardiovascular risk and specific lipid/LDL goals reviewed.  We discussed several lifestyle modifications today and [name] will continue to work on diet, exercise and weight loss efforts.  - Will continue routine screening as patient continues with health goals and weight loss journey   4. Deficiency of vitamin B12 The diagnosis was reviewed with the patient. Counseling provided today, see below. We will continue to monitor. Orders and follow up as documented in patient record.  Counseling The body needs vitamin B12: to make red blood cells; to make DNA; and to help the nerves work properly so they can carry messages from the brain to the body.  The main causes of vitamin B12 deficiency include dietary deficiency, digestive diseases, pernicious anemia, and having a surgery in which part of the stomach or small intestine is removed.  Certain medicines can make it harder for the body to  absorb vitamin B12. These medicines include: heartburn medications; some antibiotics; some medications used to treat diabetes, gout, and high cholesterol.  In some cases, there are no symptoms of this condition. If the condition leads to anemia or nerve damage, various symptoms can occur, such as weakness or fatigue, shortness of breath, and  numbness or tingling in your hands and feet.   Treatment:  May include taking vitamin B12 supplements.  Avoid alcohol.  Eat lots of healthy foods that contain vitamin B12: Beef, pork, chicken, [place], and organ meats, such as liver.  Seafood: This includes clams, rainbow trout, salmon, tuna, and haddock. Eggs.  Cereal and dairy products that are fortified: This means that vitamin B12 has been added to the food.   5. At risk for constipation [name] is at increased risk for constipation due to inadequate water intake, changes in diet, and/or use of certain medications. Patient was provided with 9 minutes of counseling today regarding this condition and related ones.  We discussed preventative OTC therapies that exist such as MiraLAX, stool softeners, etc.   We also discussed the importance of adequate fiber intake and for patient to drink 1/2 weight in ounces of water per day, unless told otherwise by a Cardiologist, Nephrologist, or another medical provider.     6. Obesity, current BMI 36.1 [name] is currently in the action stage of change. As such, her goal is to continue with weight loss efforts. She has agreed to the Category 2 Plan.   Goal is to continue with exercise and eating all foods on plan.  Exercise goals:  As is  Behavioral modification strategies: increasing water intake and planning for success.  [name] has agreed to follow-up with our clinic in 2 weeks with Dr. Dalbert Garnet. She was informed of the importance of frequent follow-up visits to maximize her success with intensive lifestyle modifications for her multiple health conditions.   Objective:   Blood pressure 116/78, pulse 86, temperature 98.3 F (36.8 C), height 5\' 6"  (1.676 m), weight 223 lb (101.2 kg), SpO2 100 %. Body mass index is 35.99 kg/m.  General: Cooperative, alert, well developed, in no acute distress. HEENT: Conjunctivae and lids unremarkable. Cardiovascular: Regular rhythm.  Lungs: Normal  work of breathing. Neurologic: No focal deficits.   Lab Results  Component Value Date   CREATININE 0.94 03/30/2021   BUN 11 03/30/2021   NA 138 03/30/2021   K 4.2 03/30/2021   CL 102 03/30/2021   CO2 18 (L) 03/30/2021   Lab Results  Component Value Date   ALT 14 03/30/2021   AST 15 03/30/2021   ALKPHOS 46 03/30/2021   BILITOT 0.3 03/30/2021   Lab Results  Component Value Date   HGBA1C 4.9 08/15/2021   HGBA1C 5.0 03/30/2021   HGBA1C 5.1 12/14/2016   Lab Results  Component Value Date   INSULIN 7.5 08/15/2021   INSULIN 11.5 03/30/2021   Lab Results  Component Value Date   TSH 1.540 03/30/2021   Lab Results  Component Value Date   CHOL 183 08/15/2021   HDL 61 08/15/2021   LDLCALC 101 (H) 08/15/2021   TRIG 121 08/15/2021   CHOLHDL 3.0 08/15/2021   Lab Results  Component Value Date   VD25OH 41.3 08/15/2021   VD25OH 39.8 03/30/2021   Lab Results  Component Value Date   WBC 7.1 03/30/2021   HGB 14.4 03/30/2021   HCT 43.9 03/30/2021   MCV 89 03/30/2021   PLT 400.0 09/22/2020    Attestation Statements:  Reviewed by clinician on day of visit: allergies, medications, problem list, medical history, surgical history, family history, social history, and previous encounter notes.  Edmund Hilda, CMA, am acting as transcriptionist for [employer], DO.  I have reviewed the above documentation for accuracy and completeness, and I agree with the above. [name], D.O.  The 21st Century Cures Act was signed into law in 2016 which includes the topic of electronic health records.  This provides immediate access to information in MyChart.  This includes consultation notes, operative notes, office notes, lab results and pathology reports.  If you have any questions about what you read please let us know at your next visit so we can discuss your concerns and take corrective action if need be.  We are right here with you.

## 2021-09-12 ENCOUNTER — Ambulatory Visit (INDEPENDENT_AMBULATORY_CARE_PROVIDER_SITE_OTHER): Payer: Commercial Managed Care - PPO | Admitting: Family Medicine

## 2021-09-12 ENCOUNTER — Encounter (INDEPENDENT_AMBULATORY_CARE_PROVIDER_SITE_OTHER): Payer: Self-pay | Admitting: Family Medicine

## 2021-09-12 ENCOUNTER — Other Ambulatory Visit: Payer: Self-pay

## 2021-09-12 VITALS — BP 96/59 | HR 80 | Temp 98.1°F | Ht 66.0 in | Wt 230.0 lb

## 2021-09-12 DIAGNOSIS — E559 Vitamin D deficiency, unspecified: Secondary | ICD-10-CM | POA: Diagnosis not present

## 2021-09-12 DIAGNOSIS — E8881 Metabolic syndrome: Secondary | ICD-10-CM | POA: Diagnosis not present

## 2021-09-12 DIAGNOSIS — Z6837 Body mass index (BMI) 37.0-37.9, adult: Secondary | ICD-10-CM | POA: Diagnosis not present

## 2021-09-12 DIAGNOSIS — Z9189 Other specified personal risk factors, not elsewhere classified: Secondary | ICD-10-CM

## 2021-09-12 DIAGNOSIS — Z6836 Body mass index (BMI) 36.0-36.9, adult: Secondary | ICD-10-CM

## 2021-09-12 DIAGNOSIS — E669 Obesity, unspecified: Secondary | ICD-10-CM | POA: Diagnosis not present

## 2021-09-12 MED ORDER — VITAMIN D (ERGOCALCIFEROL) 1.25 MG (50000 UNIT) PO CAPS: 50000.0000 [IU] | ORAL_CAPSULE | ORAL | 0 refills | Status: DC

## 2021-09-12 NOTE — Progress Notes (Signed)
Chief Complaint:   OBESITY [name] is here to discuss her progress with her obesity treatment plan along with follow-up of her obesity related diagnoses. [name] is on the Category 2 Plan and states she is following her eating plan approximately 80% of the time. [name] states she is at the gym for 60 minutes 3 times per week.  Today's visit was #: 9 Starting weight: 228 lbs Starting date: 03/30/2021 Today's weight: 230 lbs Today's date: 09/12/2021 Total lbs lost to date: 0 Total lbs lost since last in-office visit: 0  Interim History: [name] notes that since her last visit she went to a hockey game and she had a super bowl party last night. She is going out to eat for Valentine's Day tonight. She hasn't been able to go to the gym on a regular basis, but she plans to restart going back. She has tried to be mindful of her food choices but her sodium has increased since her last visit.  Subjective:   1. Vitamin D deficiency [name] is taking Vit D 50,000 IU weekly and she denies side effects.  2. Insulin resistance [name] is taking metformin 500 mg, and she denies side effects. Last insulin and A1c looked better.  3. At risk for diabetes mellitus [name] is at higher than average risk for developing diabetes due to her obesity.  Assessment/Plan:   1. Vitamin D deficiency Low Vitamin D level contributes to fatigue and are associated with obesity, breast, and colon cancer. We will refill prescription Vitamin D for 1 month. [name] will follow-up for routine testing of Vitamin D, at least 2-3 times per year to avoid over-replacement.  - Vitamin D, Ergocalciferol, (DRISDOL) 1.25 MG (50000 UNIT) CAPS capsule; Take 1 capsule (50,000 Units total) by mouth every 7 (seven) days.  Dispense: 4 capsule; Refill: 0  2. Insulin resistance [name] will continue metformin 500 mg daily. She will continue working on her dietary changes, exercise, and weight loss.  3. At risk for diabetes  mellitus [name] was given approximately 15 minutes of diabetic education and counseling today. We discussed intensive lifestyle modifications today with an emphasis on weight loss as well as increasing exercise and decreasing simple carbohydrates in her diet. We also reviewed medication options with an emphasis on risk versus benefits of those discussed.  Repetitive spaced learning was employed today to elicit superior memory formation and behavioral change.  4. Obesity, current BMI 37.1 [name] is currently in the action stage of change. As such, her goal is to continue with weight loss efforts. She has agreed to the Category 2 Plan.   Exercise goals: As is. We discussed exercising at home when she is unable to go to the gym.  Behavioral modification strategies: increasing lean protein intake, increasing water intake, celebration eating strategies, and planning for success.  [name] has agreed to follow-up with our clinic in 3 weeks. She was informed of the importance of frequent follow-up visits to maximize her success with intensive lifestyle modifications for her multiple health conditions.   Objective:   Blood pressure (!) 96/59, pulse 80, temperature 98.1 F (36.7 C), temperature source Oral, height 5\' 6"  (1.676 m), weight 230 lb (104.3 kg), SpO2 97 %. Body mass index is 37.12 kg/m.  General: Cooperative, alert, well developed, in no acute distress. HEENT: Conjunctivae and lids unremarkable. Cardiovascular: Regular rhythm.  Lungs: Normal work of breathing. Neurologic: No focal deficits.   Lab Results  Component Value Date   CREATININE 0.94 03/30/2021   BUN  11 03/30/2021   NA 138 03/30/2021   K 4.2 03/30/2021   CL 102 03/30/2021   CO2 18 (L) 03/30/2021   Lab Results  Component Value Date   ALT 14 03/30/2021   AST 15 03/30/2021   ALKPHOS 46 03/30/2021   BILITOT 0.3 03/30/2021   Lab Results  Component Value Date   HGBA1C 4.9 08/15/2021   HGBA1C 5.0 03/30/2021   HGBA1C  5.1 12/14/2016   Lab Results  Component Value Date   INSULIN 7.5 08/15/2021   INSULIN 11.5 03/30/2021   Lab Results  Component Value Date   TSH 1.540 03/30/2021   Lab Results  Component Value Date   CHOL 183 08/15/2021   HDL 61 08/15/2021   LDLCALC 101 (H) 08/15/2021   TRIG 121 08/15/2021   CHOLHDL 3.0 08/15/2021   Lab Results  Component Value Date   VD25OH 41.3 08/15/2021   VD25OH 39.8 03/30/2021   Lab Results  Component Value Date   WBC 7.1 03/30/2021   HGB 14.4 03/30/2021   HCT 43.9 03/30/2021   MCV 89 03/30/2021   PLT 400.0 09/22/2020   No results found for: IRON, TIBC, FERRITIN  Attestation Statements:   Reviewed by clinician on day of visit: allergies, medications, problem list, medical history, surgical history, family history, social history, and previous encounter notes.   I, [name], am acting as transcriptionist for Kaylee Quince, MD.  I have reviewed the above documentation for accuracy and completeness, and I agree with the above. -  Kaylee Quince, MD

## 2021-09-29 ENCOUNTER — Other Ambulatory Visit: Payer: Self-pay

## 2021-09-29 ENCOUNTER — Encounter (INDEPENDENT_AMBULATORY_CARE_PROVIDER_SITE_OTHER): Payer: Self-pay | Admitting: Family Medicine

## 2021-09-29 ENCOUNTER — Ambulatory Visit (INDEPENDENT_AMBULATORY_CARE_PROVIDER_SITE_OTHER): Payer: Commercial Managed Care - PPO | Admitting: Family Medicine

## 2021-09-29 VITALS — BP 107/71 | HR 90 | Temp 98.1°F | Ht 66.0 in | Wt 224.0 lb

## 2021-09-29 DIAGNOSIS — E669 Obesity, unspecified: Secondary | ICD-10-CM

## 2021-09-29 DIAGNOSIS — Z9189 Other specified personal risk factors, not elsewhere classified: Secondary | ICD-10-CM

## 2021-09-29 DIAGNOSIS — E559 Vitamin D deficiency, unspecified: Secondary | ICD-10-CM

## 2021-09-29 DIAGNOSIS — Z6836 Body mass index (BMI) 36.0-36.9, adult: Secondary | ICD-10-CM | POA: Diagnosis not present

## 2021-09-29 DIAGNOSIS — Z6835 Body mass index (BMI) 35.0-35.9, adult: Secondary | ICD-10-CM

## 2021-09-29 DIAGNOSIS — E8881 Metabolic syndrome: Secondary | ICD-10-CM | POA: Diagnosis not present

## 2021-09-29 MED ORDER — SEMAGLUTIDE-WEIGHT MANAGEMENT 0.25 MG/0.5ML ~~LOC~~ SOAJ: 0.2500 mg | SUBCUTANEOUS | 0 refills | Status: DC

## 2021-09-29 MED ORDER — METFORMIN HCL 500 MG PO TABS: ORAL_TABLET | ORAL | 0 refills | Status: DC

## 2021-09-29 MED ORDER — VITAMIN D (ERGOCALCIFEROL) 1.25 MG (50000 UNIT) PO CAPS: 50000.0000 [IU] | ORAL_CAPSULE | ORAL | 0 refills | Status: DC

## 2021-10-03 ENCOUNTER — Encounter (INDEPENDENT_AMBULATORY_CARE_PROVIDER_SITE_OTHER): Payer: Self-pay

## 2021-10-03 ENCOUNTER — Telehealth (INDEPENDENT_AMBULATORY_CARE_PROVIDER_SITE_OTHER): Payer: Self-pay | Admitting: Family Medicine

## 2021-10-03 NOTE — Telephone Encounter (Signed)
Prior authorization denied for [facility] by fax. Per insurance: Plan excluded all medications for weight loss.  ?

## 2021-10-03 NOTE — Progress Notes (Signed)
? ? ? ?Chief Complaint:  ? ?OBESITY ?[name] is here to discuss her progress with her obesity treatment plan along with follow-up of her obesity related diagnoses. [name] is on the Category 2 Plan and states she is following her eating plan approximately 80% of the time. [name] states she is walking for 30 minutes 3 times per week. ? ?Today's visit was #: 10 ?Starting weight: 228 lbs ?Starting date: 03/30/2021 ?Today's weight: 224 lbs ?Today's date: 09/29/2021 ?Total lbs lost to date: 4 ?Total lbs lost since last in-office visit: 6 ? ?Interim History: [name] is doing well with weight loss since her last visit. She is following Category 2 plan well. She still notes hunger in the evenings and some minimal cravings during her menses.  ? ?Subjective:  ? ?1. Vitamin D deficiency ?[name] is taking Vit D 50,000 IU weekly, and she denies side effects. She denies nausea, vomiting, or muscle weakness. Last Vit D was 41.3 on 08/15/2021. ? ?2. Insulin resistance ?[name] is taking metformin 500 mg daily. She denies side effects. Last fasting insulin looked better.  ? ?3. At risk for diabetes mellitus ?[name] is at higher than average risk for developing diabetes due to her obesity. ? ?Assessment/Plan:  ? ?1. Vitamin D deficiency ?Low Vitamin D level contributes to fatigue and are associated with obesity, breast, and colon cancer. We will refill prescription Vitamin D for 1 month. Side effects were discussed. She will follow-up for routine testing of Vitamin D, at least 2-3 times per year to avoid over-replacement. ? ?- Vitamin D, Ergocalciferol, (DRISDOL) 1.25 MG (50000 UNIT) CAPS capsule; Take 1 capsule (50,000 Units total) by mouth every 7 (seven) days.  Dispense: 4 capsule; Refill: 0 ? ?2. Insulin resistance ?We will refill metformin 500 mg for 1 month. Side effects were discussed. [name] will continue working on dietary changes, exercise, and weight loss to help decrease the risk of diabetes. [name] agreed to follow-up with [place]  as directed to closely monitor her progress. ? ?- metFORMIN (GLUCOPHAGE) 500 MG tablet; 1 po with lunch daily  Dispense: 30 tablet; Refill: 0 ? ?3. At risk for diabetes mellitus ?[name] was given approximately 15 minutes of diabetic education and counseling today. We discussed intensive lifestyle modifications today with an emphasis on weight loss as well as increasing exercise and decreasing simple carbohydrates in her diet. We also reviewed medication options with an emphasis on risk versus benefits of those discussed. ? ?Repetitive spaced learning was employed today to elicit superior memory formation and behavioral change. ? ?4. Obesity with current BMI 36.2 ?[name] is currently in the action stage of change. As such, her goal is to continue with weight loss efforts. She has agreed to the Category 2 Plan.  ? ?We discussed various medication options to help [name] with her weight loss efforts and we both agreed to start Wegovy 0.25 mg weekly with no refills. Side effects were discussed. She denies a history of pancreatitis or family history of medley thyroid cancer. Last triglycerides were 121 on 08/15/2021 and last TSH was 1.540 on 03/30/2021. ? ?- Semaglutide-Weight Management 0.25 MG/0.5ML SOAJ; Inject 0.25 mg into the skin once a week.  Dispense: 2 mL; Refill: 0 ? ?Exercise goals: As is. ? ?Behavioral modification strategies: increasing lean protein intake, increasing water intake, and meal planning and cooking strategies. ? ?[name] has agreed to follow-up with our clinic in 2 weeks. She was informed of the importance of frequent follow-up visits to maximize her success with intensive lifestyle modifications for  her multiple health conditions.  ? ?Objective:  ? ?Blood pressure 107/71, pulse 90, temperature 98.1 ?F (36.7 ?C), height 5\' 6"  (1.676 m), weight 224 lb (101.6 kg), SpO2 98 %. ?Body mass index is 36.15 kg/m?. ? ?General: Cooperative, alert, well developed, in no acute distress. ?HEENT: Conjunctivae and  lids unremarkable. ?Cardiovascular: Regular rhythm.  ?Lungs: Normal work of breathing. ?Neurologic: No focal deficits.  ? ?Lab Results  ?Component Value Date  ? CREATININE 0.94 03/30/2021  ? BUN 11 03/30/2021  ? NA 138 03/30/2021  ? K 4.2 03/30/2021  ? CL 102 03/30/2021  ? CO2 18 (L) 03/30/2021  ? ?Lab Results  ?Component Value Date  ? ALT 14 03/30/2021  ? AST 15 03/30/2021  ? ALKPHOS 46 03/30/2021  ? BILITOT 0.3 03/30/2021  ? ?Lab Results  ?Component Value Date  ? HGBA1C 4.9 08/15/2021  ? HGBA1C 5.0 03/30/2021  ? HGBA1C 5.1 12/14/2016  ? ?Lab Results  ?Component Value Date  ? INSULIN 7.5 08/15/2021  ? INSULIN 11.5 03/30/2021  ? ?Lab Results  ?Component Value Date  ? TSH 1.540 03/30/2021  ? ?Lab Results  ?Component Value Date  ? CHOL 183 08/15/2021  ? HDL 61 08/15/2021  ? LDLCALC 101 (H) 08/15/2021  ? TRIG 121 08/15/2021  ? CHOLHDL 3.0 08/15/2021  ? ?Lab Results  ?Component Value Date  ? VD25OH 41.3 08/15/2021  ? VD25OH 39.8 03/30/2021  ? ?Lab Results  ?Component Value Date  ? WBC 7.1 03/30/2021  ? HGB 14.4 03/30/2021  ? HCT 43.9 03/30/2021  ? MCV 89 03/30/2021  ? PLT 400.0 09/22/2020  ? ?No results found for: IRON, TIBC, FERRITIN ? ?Attestation Statements:  ? ?Reviewed by clinician on day of visit: allergies, medications, problem list, medical history, surgical history, family history, social history, and previous encounter notes. ? ? ?I, 09/24/2020, am acting as transcriptionist for Burt Knack, MD. ? ?I have reviewed the above documentation for accuracy and completeness, and I agree with the above. -  Quillian Quince, MD ? ? ?

## 2021-10-06 ENCOUNTER — Encounter (INDEPENDENT_AMBULATORY_CARE_PROVIDER_SITE_OTHER): Payer: Self-pay

## 2021-10-13 ENCOUNTER — Ambulatory Visit (INDEPENDENT_AMBULATORY_CARE_PROVIDER_SITE_OTHER): Payer: Commercial Managed Care - PPO | Admitting: Family Medicine

## 2021-10-25 ENCOUNTER — Encounter (INDEPENDENT_AMBULATORY_CARE_PROVIDER_SITE_OTHER): Payer: Self-pay | Admitting: Family Medicine

## 2021-10-25 ENCOUNTER — Other Ambulatory Visit: Payer: Self-pay

## 2021-10-25 ENCOUNTER — Ambulatory Visit (INDEPENDENT_AMBULATORY_CARE_PROVIDER_SITE_OTHER): Payer: Commercial Managed Care - PPO | Admitting: Family Medicine

## 2021-10-25 VITALS — BP 110/73 | HR 99 | Temp 98.1°F | Ht 66.0 in | Wt 229.0 lb

## 2021-10-25 DIAGNOSIS — R7303 Prediabetes: Secondary | ICD-10-CM

## 2021-10-25 DIAGNOSIS — E66812 Obesity, class 2: Secondary | ICD-10-CM

## 2021-10-25 DIAGNOSIS — Z9189 Other specified personal risk factors, not elsewhere classified: Secondary | ICD-10-CM

## 2021-10-25 DIAGNOSIS — Z6836 Body mass index (BMI) 36.0-36.9, adult: Secondary | ICD-10-CM | POA: Diagnosis not present

## 2021-10-25 DIAGNOSIS — E559 Vitamin D deficiency, unspecified: Secondary | ICD-10-CM

## 2021-10-25 DIAGNOSIS — E669 Obesity, unspecified: Secondary | ICD-10-CM

## 2021-10-25 MED ORDER — METFORMIN HCL 500 MG PO TABS: 500.0000 mg | ORAL_TABLET | Freq: Two times a day (BID) | ORAL | 0 refills | Status: DC

## 2021-10-25 MED ORDER — VITAMIN D (ERGOCALCIFEROL) 1.25 MG (50000 UNIT) PO CAPS: 50000.0000 [IU] | ORAL_CAPSULE | ORAL | 0 refills | Status: DC

## 2021-10-31 NOTE — Progress Notes (Signed)
? ? ? ?Chief Complaint:  ? ?OBESITY ?[name] is here to discuss her progress with her obesity treatment plan along with follow-up of her obesity related diagnoses. [name] is on the Category 2 Plan and states she is following her eating plan approximately 50% of the time. [name] states she is walking for 60 minutes 6 times per week. ? ?Today's visit was #: 11 ?Starting weight: 228 lbs ?Starting date: 03/30/2021 ?Today's weight: 229 lbs ?Today's date: 10/25/2021 ?Total lbs lost to date: 0 ?Total lbs lost since last in-office visit: 0 ? ?Interim History: Irvin did more social and eating out since her last visit. She is working on getting back on track now.  ? ?Subjective:  ? ?1. Pre-diabetes ?[name] is on metformin, and she is still working on diet and weight loss. She denies side effects with metformin, but still notes some polyphagia.  ? ?2. Vitamin D deficiency ?[name] is on Vitamin D, but her level is not yet at goal. ? ?3. At risk for impaired metabolic function ?[name] is at increased risk for impaired metabolic function if calories or protein are too low.  ? ?Assessment/Plan:  ? ?1. Pre-diabetes ?[name] agreed to increase metformin to 500 mg BID with no refills. ? ?- metFORMIN (GLUCOPHAGE) 500 MG tablet; Take 1 tablet (500 mg total) by mouth 2 (two) times daily with a meal. 1 po with lunch daily  Dispense: 60 tablet; Refill: 0 ? ?2. Vitamin D deficiency ?We will refill prescription Vitamin D for 1 month. [name] will follow-up for routine testing of Vitamin D, at least 2-3 times per year to avoid over-replacement. ? ?- Vitamin D, Ergocalciferol, (DRISDOL) 1.25 MG (50000 UNIT) CAPS capsule; Take 1 capsule (50,000 Units total) by mouth every 7 (seven) days.  Dispense: 4 capsule; Refill: 0 ? ?3. At risk for impaired metabolic function ?[name] was given approximately 15 minutes of impaired  metabolic function prevention counseling today. We discussed intensive lifestyle modifications today with an emphasis on specific  nutrition and exercise instructions and strategies.  ? ?Repetitive spaced learning was employed today to elicit superior memory formation and behavioral change. ? ?4. Obesity with current BMI 36.2   ?[name] is currently in the action stage of change. As such, her goal is to continue with weight loss efforts. She has agreed to the Category 2 Plan.  ? ?Exercise goals: As is. ? ?Behavioral modification strategies: increasing lean protein intake, meal planning and cooking strategies, and celebration eating strategies. ? ?[name] has agreed to follow-up with our clinic in 2 weeks. She was informed of the importance of frequent follow-up visits to maximize her success with intensive lifestyle modifications for her multiple health conditions.  ? ?Objective:  ? ?Blood pressure 110/73, pulse 99, temperature 98.1 ?F (36.7 ?C), height 5\' 6"  (1.676 m), weight 229 lb (103.9 kg), SpO2 98 %. ?Body mass index is 36.96 kg/m?. ? ?General: Cooperative, alert, well developed, in no acute distress. ?HEENT: Conjunctivae and lids unremarkable. ?Cardiovascular: Regular rhythm.  ?Lungs: Normal work of breathing. ?Neurologic: No focal deficits.  ? ?Lab Results  ?Component Value Date  ? CREATININE 0.94 03/30/2021  ? BUN 11 03/30/2021  ? NA 138 03/30/2021  ? K 4.2 03/30/2021  ? CL 102 03/30/2021  ? CO2 18 (L) 03/30/2021  ? ?Lab Results  ?Component Value Date  ? ALT 14 03/30/2021  ? AST 15 03/30/2021  ? ALKPHOS 46 03/30/2021  ? BILITOT 0.3 03/30/2021  ? ?Lab Results  ?Component Value Date  ? HGBA1C 4.9 08/15/2021  ?  HGBA1C 5.0 03/30/2021  ? HGBA1C 5.1 12/14/2016  ? ?Lab Results  ?Component Value Date  ? INSULIN 7.5 08/15/2021  ? INSULIN 11.5 03/30/2021  ? ?Lab Results  ?Component Value Date  ? TSH 1.540 03/30/2021  ? ?Lab Results  ?Component Value Date  ? CHOL 183 08/15/2021  ? HDL 61 08/15/2021  ? LDLCALC 101 (H) 08/15/2021  ? TRIG 121 08/15/2021  ? CHOLHDL 3.0 08/15/2021  ? ?Lab Results  ?Component Value Date  ? VD25OH 41.3 08/15/2021  ?  VD25OH 39.8 03/30/2021  ? ?Lab Results  ?Component Value Date  ? WBC 7.1 03/30/2021  ? HGB 14.4 03/30/2021  ? HCT 43.9 03/30/2021  ? MCV 89 03/30/2021  ? PLT 400.0 09/22/2020  ? ?No results found for: IRON, TIBC, FERRITIN ? ?Attestation Statements:  ? ?Reviewed by clinician on day of visit: allergies, medications, problem list, medical history, surgical history, family history, social history, and previous encounter notes. ? ? ?I, [name], am acting as transcriptionist for Kaylee Quince, MD. ? ?I have reviewed the above documentation for accuracy and completeness, and I agree with the above. -  Kaylee Quince, MD ? ? ?

## 2021-11-10 ENCOUNTER — Ambulatory Visit (INDEPENDENT_AMBULATORY_CARE_PROVIDER_SITE_OTHER): Payer: Commercial Managed Care - PPO | Admitting: Family Medicine

## 2021-11-10 IMAGING — CT CT ABD-PELV W/ CM
2 of 4 series · 16 of 46 positions shown, 18 images · IV contrast (APPLIED)
Comparison: None.

CLINICAL DATA: 29-year-old female with right lower quadrant
abdominal pain.

EXAM:
CT ABDOMEN AND PELVIS WITH CONTRAST
TECHNIQUE: Multidetector CT imaging of the abdomen and pelvis was performed
using the standard protocol following bolus administration of
intravenous contrast.
CONTRAST:  100mL OMNIPAQUE IOHEXOL 300 MG/ML  SOLN

[Series 3: abdomen 5.0 · axial · 0.85mm/px · z∈[+713,+1143]mm · 13 of 98 slices shown, 15 images]
[im 6/98  soft-tissue]
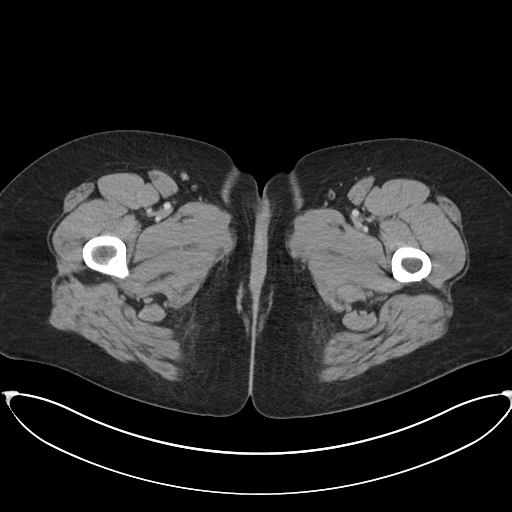
[im 6/98  bone]
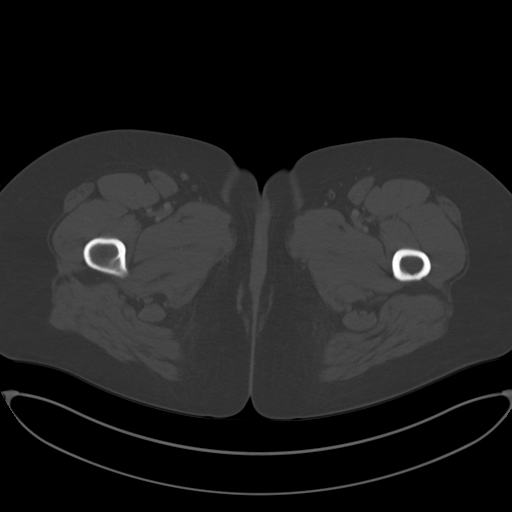
[im 11/98  soft-tissue]
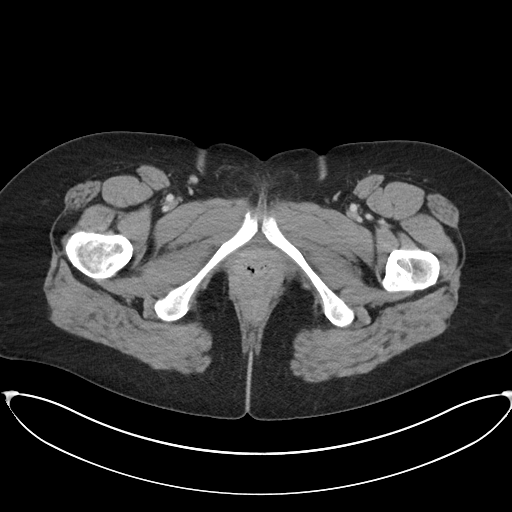
[im 22/98  soft-tissue]
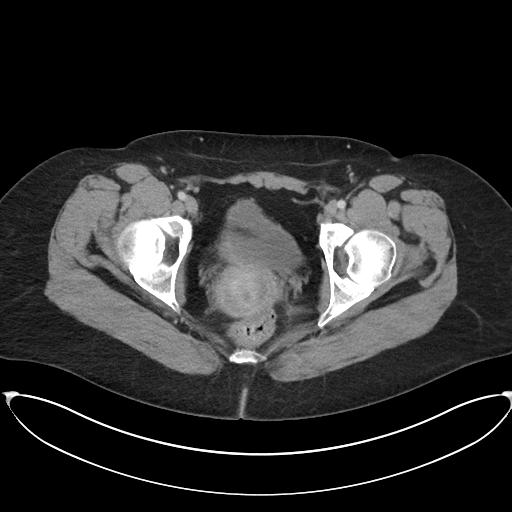
[im 27/98  soft-tissue]
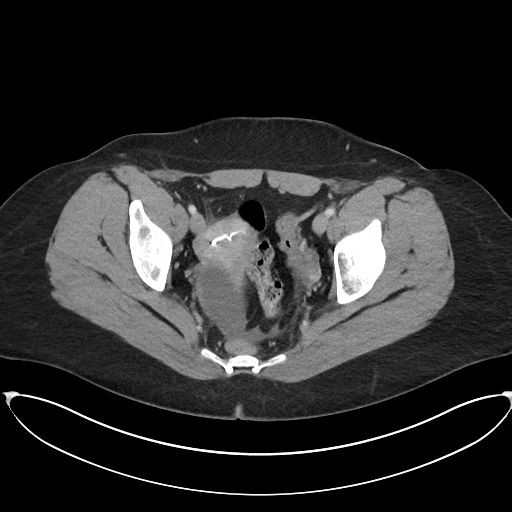
[im 33/98  soft-tissue]
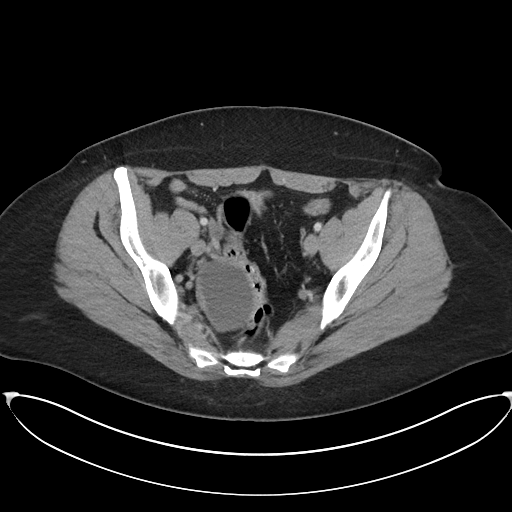
[im 44/98  soft-tissue]
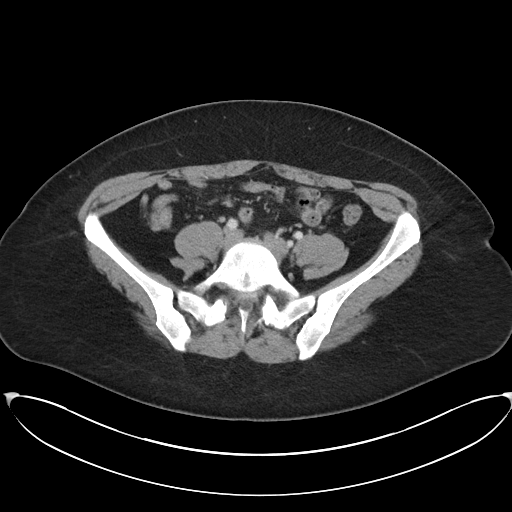
[im 49/98  soft-tissue]
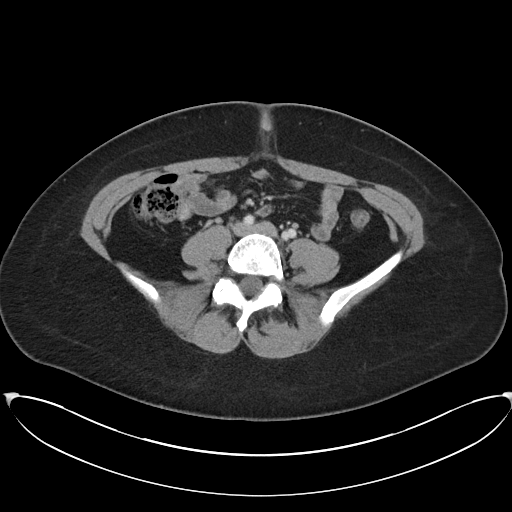
[im 54/98  soft-tissue]
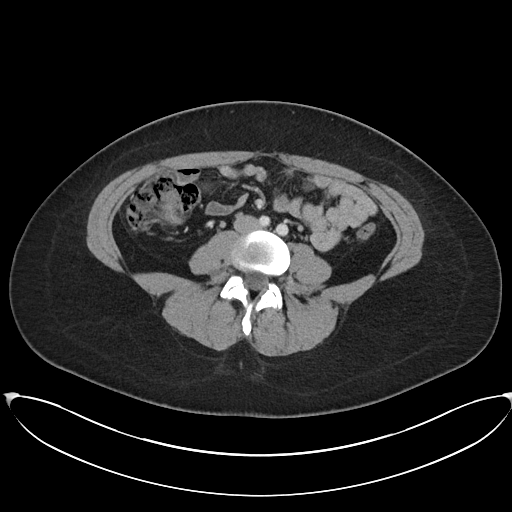
[im 65/98  soft-tissue]
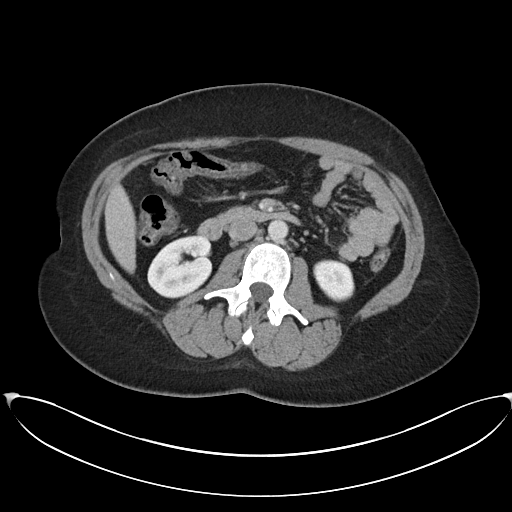
[im 65/98  bone]
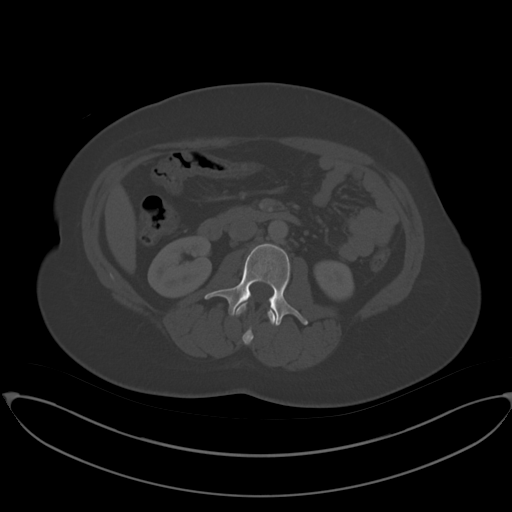
[im 71/98  soft-tissue]
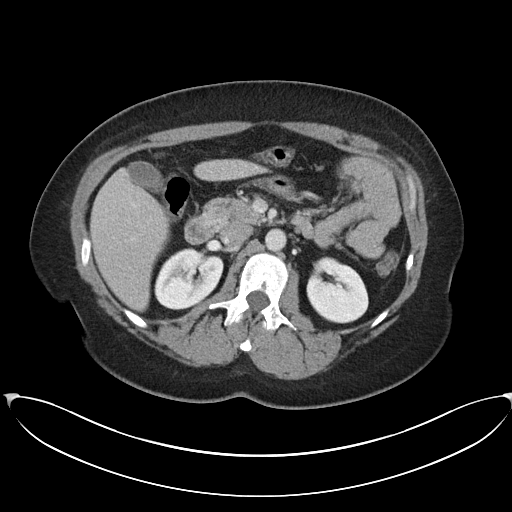
[im 76/98  soft-tissue]
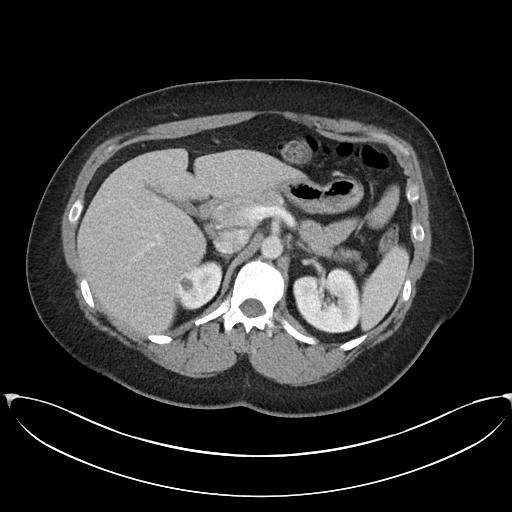
[im 87/98  soft-tissue]
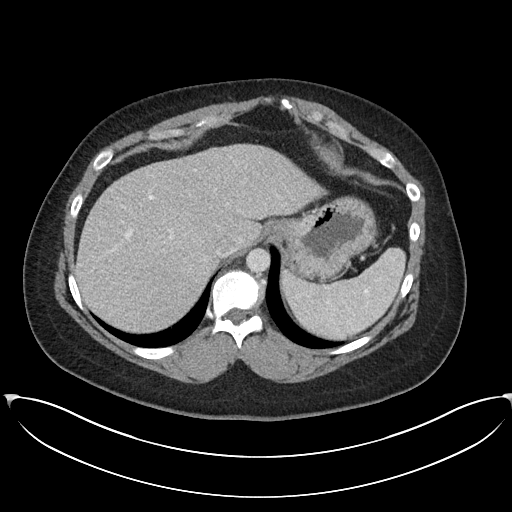
[im 92/98  soft-tissue]
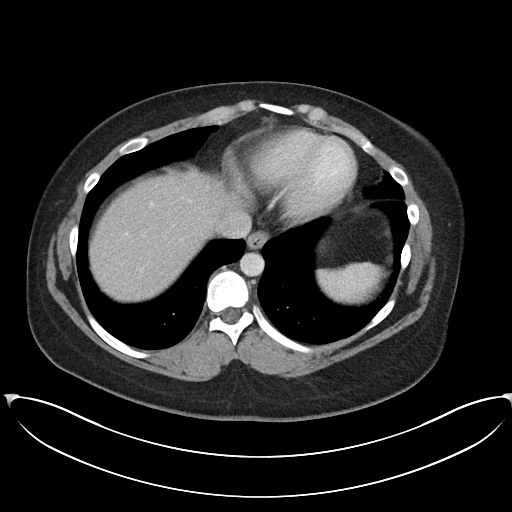

[Series 6: abdomen 3.0 mpr cor · coronal · 0.78mm/px · 3 of 99 slices shown]
[im 33/99  soft-tissue]
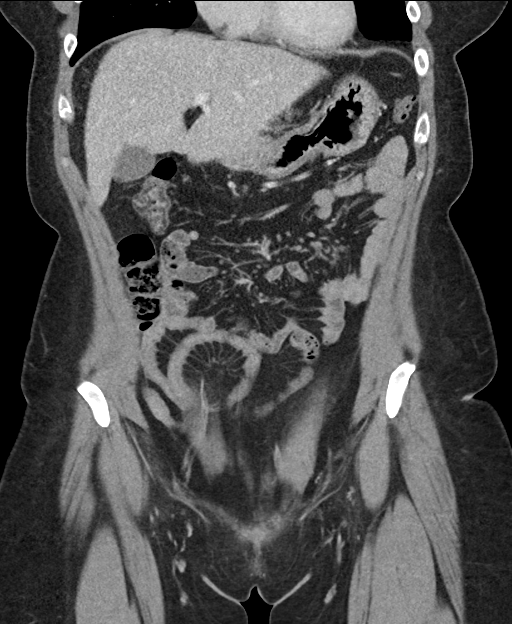
[im 44/99  soft-tissue]
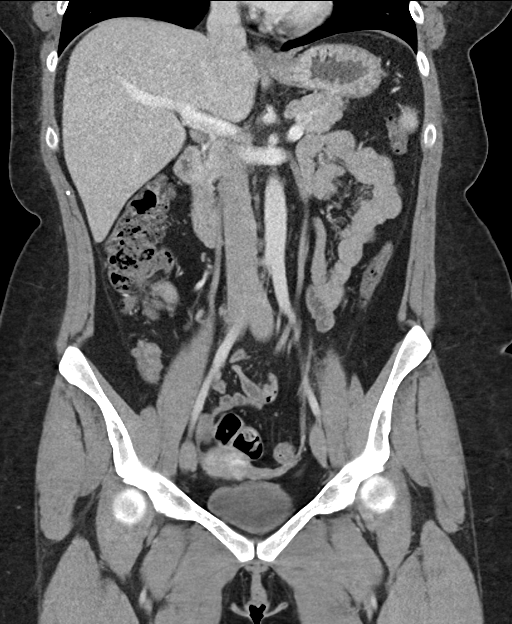
[im 55/99  soft-tissue]
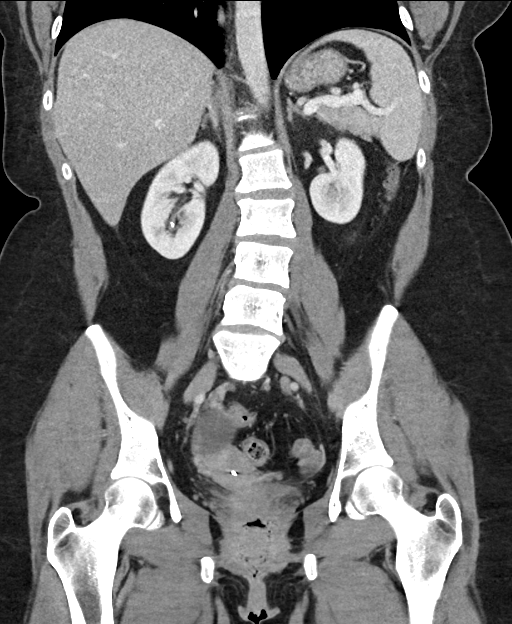

[16 of 46 positions shown; findings below may reference images not displayed]

FINDINGS: Lower chest: The visualized lung bases are clear.

No intra-abdominal free air. Small free fluid in the pelvis

Hepatobiliary: No focal liver abnormality is seen. No gallstones,
gallbladder wall thickening, or biliary dilatation.

Pancreas: Unremarkable. No pancreatic ductal dilatation or
surrounding inflammatory changes.

Spleen: Normal in size without focal abnormality.

Adrenals/Urinary Tract: The adrenal glands unremarkable. There is a
1 cm right renal upper pole cyst. There is a 5 mm nonobstructing
stone in the inferior pole of the right kidney. There is no
hydronephrosis on either side. The visualized ureters and urinary
bladder appear unremarkable.

Stomach/Bowel: There is no bowel obstruction or active inflammation.
The appendix is normal.

Vascular/Lymphatic: The abdominal aorta and IVC unremarkable. No
portal venous gas. Small scattered mesenteric lymph nodes. No
adenopathy.

Reproductive: The uterus is anteverted. An intrauterine device is
noted. There is a 6.5 cm right ovarian cyst.

Other: None

Musculoskeletal: Scoliosis. No acute osseous pathology.
IMPRESSION: 1. A 6.5 cm right ovarian cyst. Ultrasound may provide better
evaluation.
2. No bowel obstruction. Normal appendix.
3. A 5 mm nonobstructing right renal inferior pole stone. No
hydronephrosis.

## 2021-11-10 IMAGING — US US PELVIS COMPLETE TRANSABD/TRANSVAG W DUPLEX
1 series · 12 of 25 positions shown · non-contrast
Comparison: None.
COMPARISON: None.

Addendum:
CLINICAL DATA: Right lower quadrant abdominal pain x2 weeks

EXAM:
TRANSABDOMINAL AND TRANSVAGINAL ULTRASOUND OF PELVIS
DOPPLER ULTRASOUND OF OVARIES
TECHNIQUE: Both transabdominal and transvaginal ultrasound examinations of the
pelvis were performed. Transabdominal technique was performed for
global imaging of the pelvis including uterus, ovaries, adnexal
regions, and pelvic cul-de-sac.
It was necessary to proceed with endovaginal exam following the
transabdominal exam to visualize the ovaries bilaterally. Color and
duplex Doppler ultrasound was utilized to evaluate blood flow to the
ovaries.

[Series 1: us pelvic complete w transvaginal and torsion righ · 108 acquisitions, 12 frames shown]
[im 5/108]
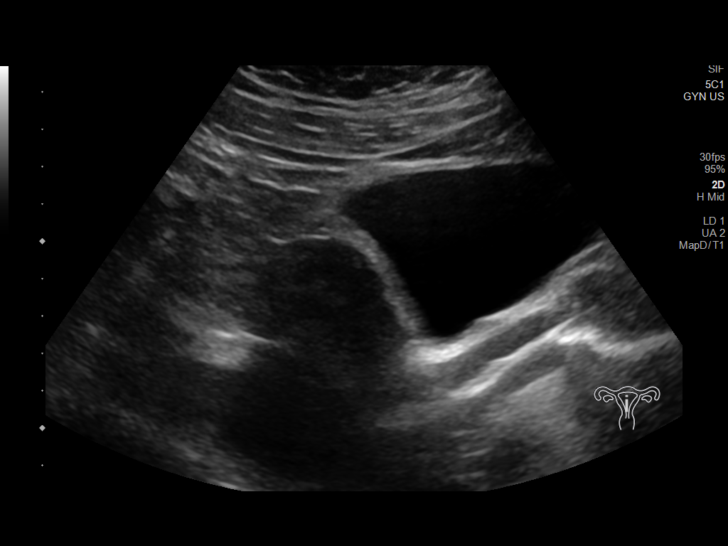
[im 14/108]
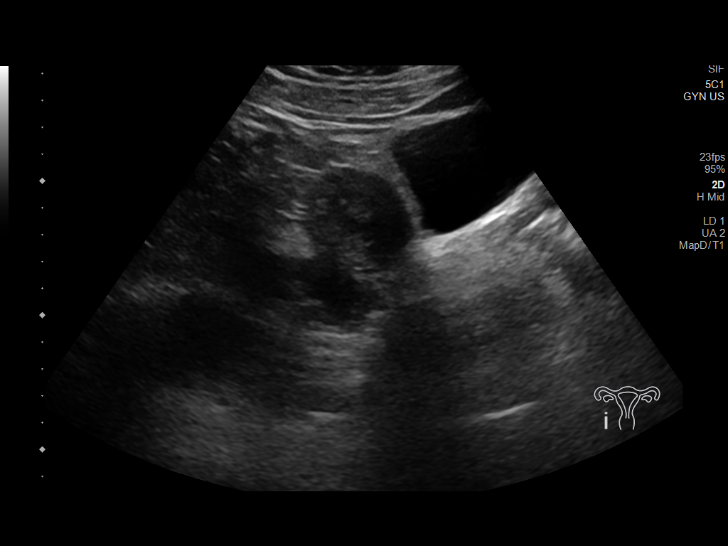
[im 23/108]
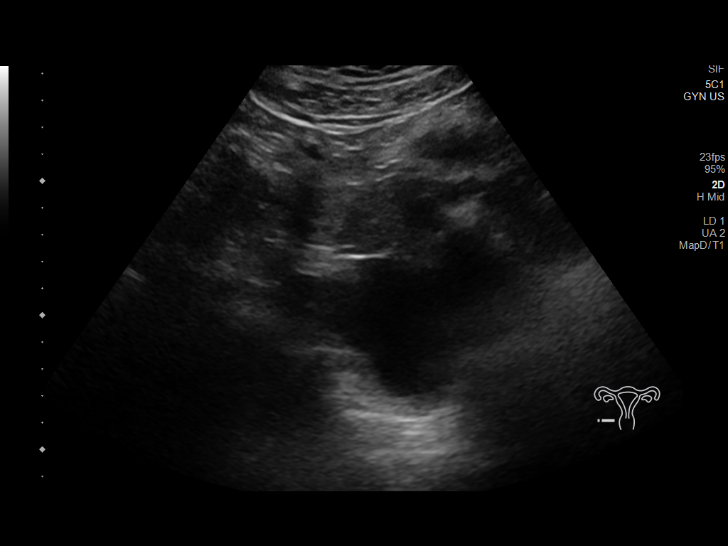
[im 32/108]
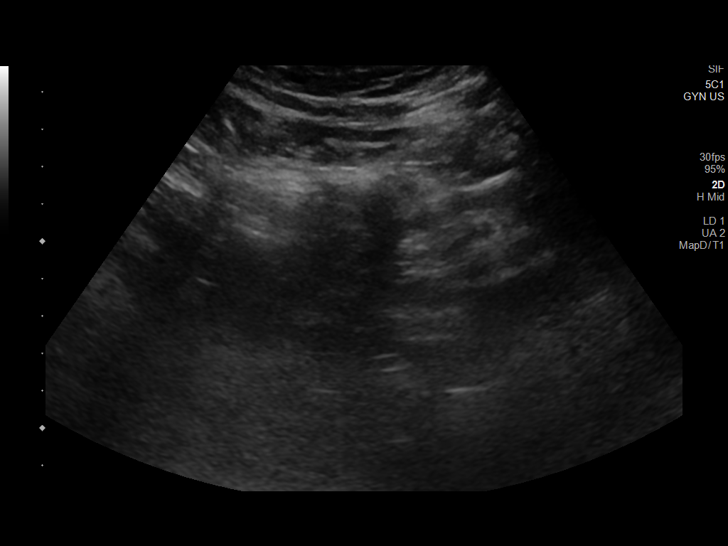
[im 41/108]
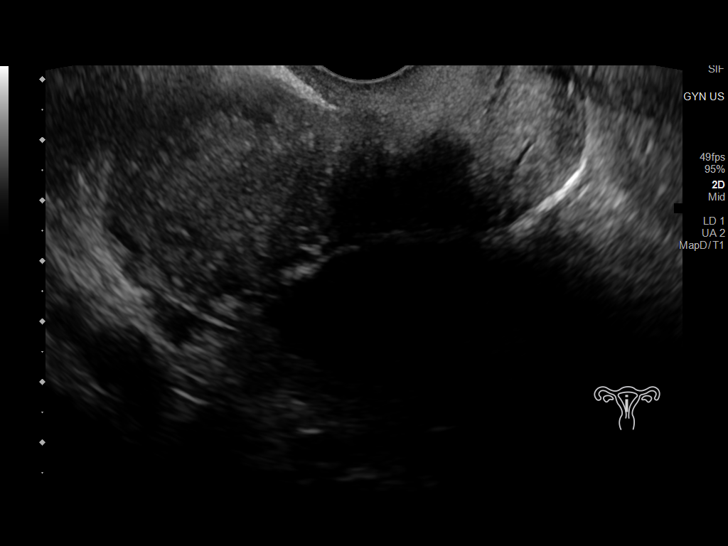
[im 50/108]
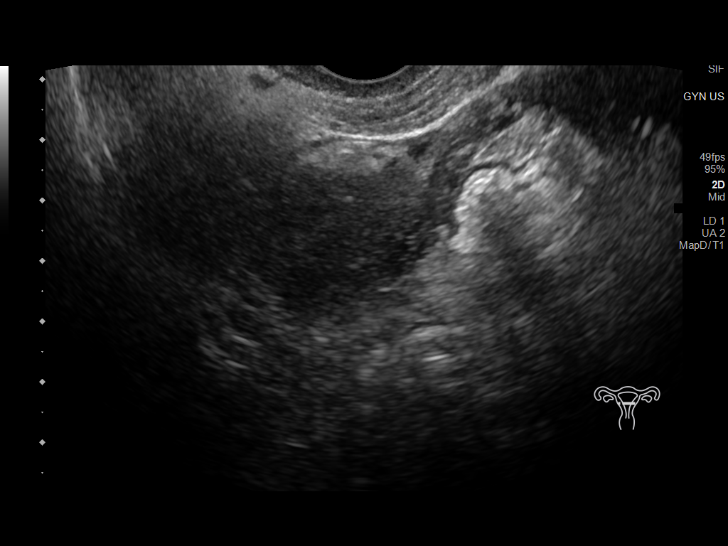
[im 58/108]
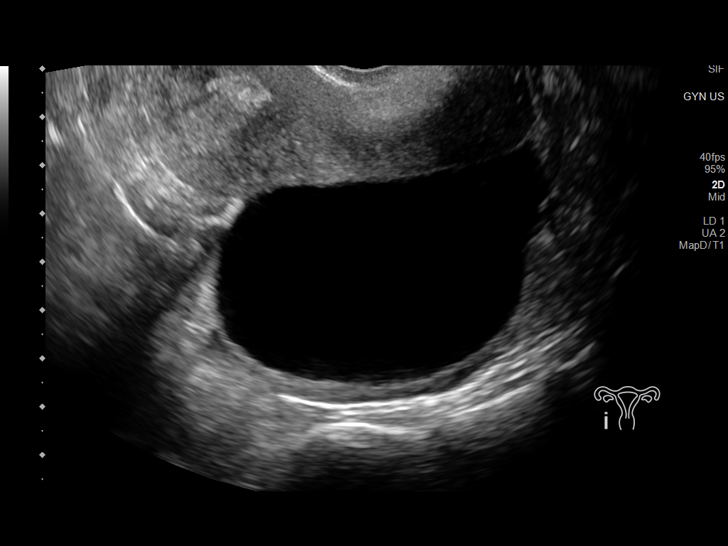
[im 67/108]
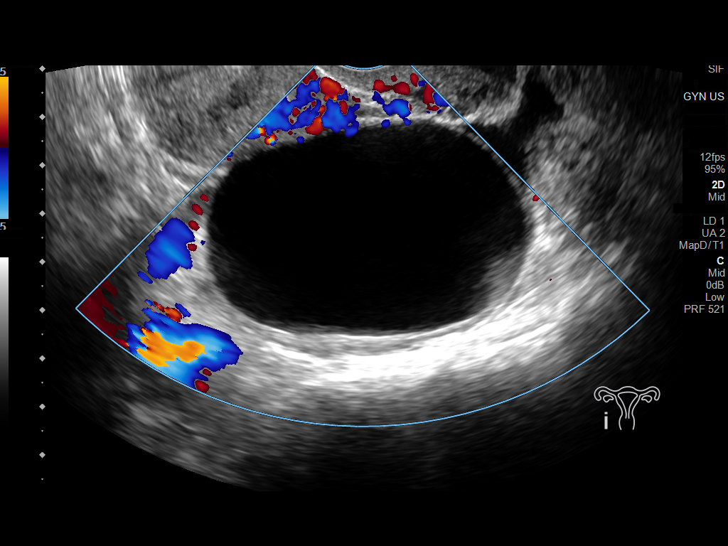
[im 76/108]
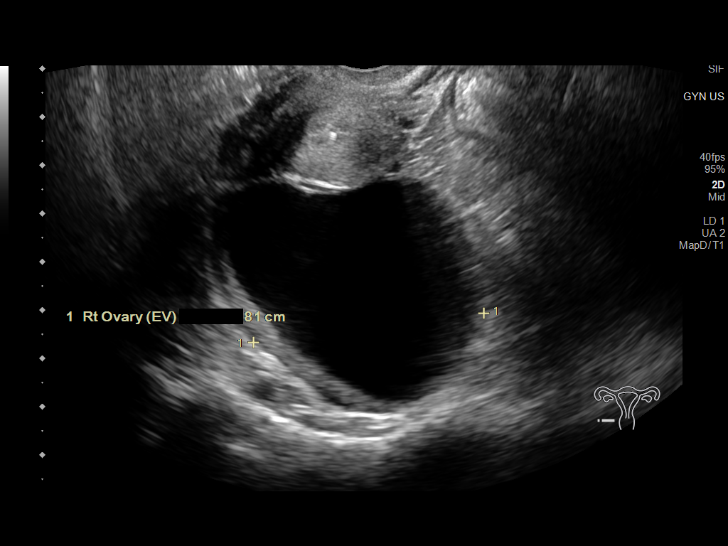
[im 85/108]
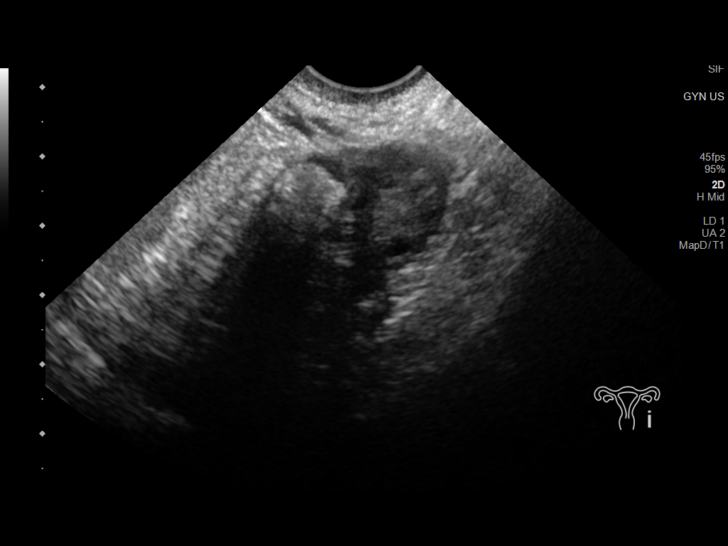
[im 94/108]
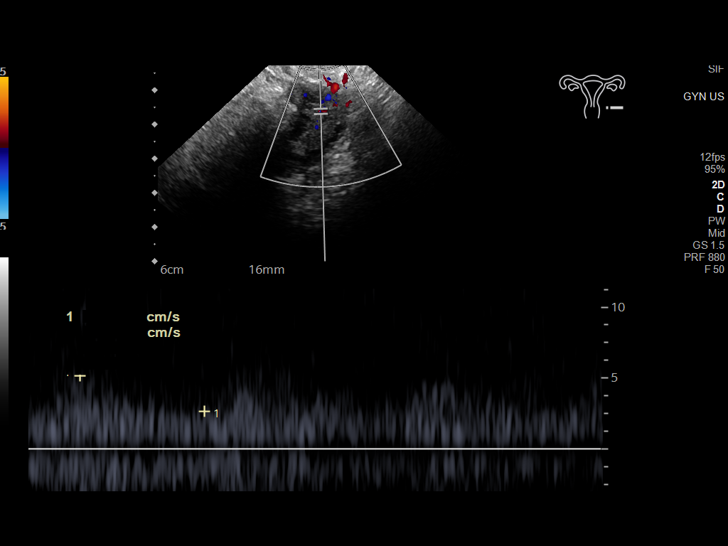
[im 103/108]
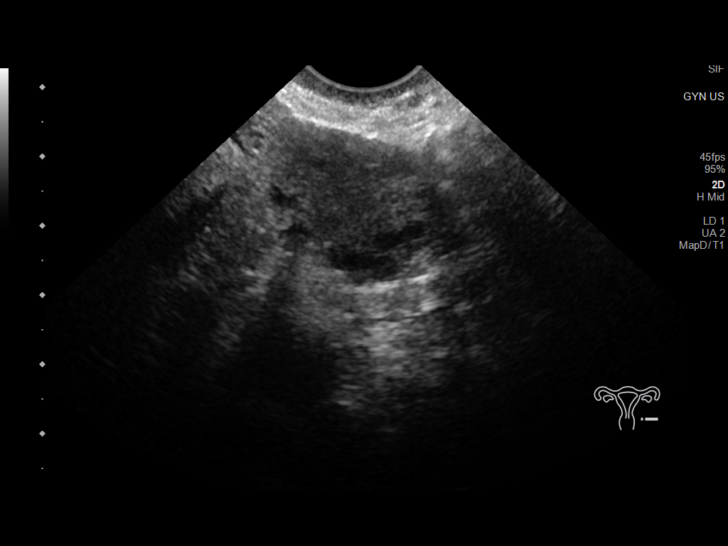

[12 of 25 positions shown; findings below may reference images not displayed]

FINDINGS: Uterus

Measurements: 8.3 x 3.3 x 4.3 cm = volume: 62 mL. No fibroids or
other mass visualized.

Endometrium

Thickness: 7 mm. An intrauterine device is in expected position
within the endometrial cavity

Right ovary

Measurements: 7.1 x 4.6 x 4.8 cm = volume: 83 mL. The right ovary is
largely replaced by a simple, unilocular cyst measuring 6.4 x 4.0 x
4.7 cm. Additionally, the right ovary appears displaced into the
cul-de-sac.

Left ovary

Measurements: 2.8 x 1.6 x 2.0 cm = volume: 5 mL. Normal
appearance/no adnexal mass. Multiple follicles are seen within the
left ovary

Pulsed Doppler evaluation of both ovaries demonstrates a normal low
resistance arterial waveform involving the right ovary, however, a
true venous waveform is not clearly identified. Normal low
resistance arterial and venous waveforms are identified involving
the left ovary.

Other findings

Trace simple appearing free fluid is seen within the cul-de-sac.
IMPRESSION: 6.4 cm simple unilocular cyst within the right ovary possibly
representing a follicular cyst. The right ovary appears displaced
into the cul-de-sac and a true low resistance venous waveform is not
clearly identified, findings which, together, could reflect changes
of partial torsion of the right ovary. Gynecologic consultation is
recommended for further management.

ADDENDUM:
These results were called by telephone at the time of interpretation
on 03/08/2020 at [DATE] to provider KEIKO GILLIES , who verbally
acknowledged these results.

*** End of Addendum ***
FINDINGS: Uterus

Measurements: 8.3 x 3.3 x 4.3 cm = volume: 62 mL. No fibroids or
other mass visualized.

Endometrium

Thickness: 7 mm. An intrauterine device is in expected position
within the endometrial cavity

Right ovary

Measurements: 7.1 x 4.6 x 4.8 cm = volume: 83 mL. The right ovary is
largely replaced by a simple, unilocular cyst measuring 6.4 x 4.0 x
4.7 cm. Additionally, the right ovary appears displaced into the
cul-de-sac.

Left ovary

Measurements: 2.8 x 1.6 x 2.0 cm = volume: 5 mL. Normal
appearance/no adnexal mass. Multiple follicles are seen within the
left ovary

Pulsed Doppler evaluation of both ovaries demonstrates a normal low
resistance arterial waveform involving the right ovary, however, a
true venous waveform is not clearly identified. Normal low
resistance arterial and venous waveforms are identified involving
the left ovary.

Other findings

Trace simple appearing free fluid is seen within the cul-de-sac.
IMPRESSION: 6.4 cm simple unilocular cyst within the right ovary possibly
representing a follicular cyst. The right ovary appears displaced
into the cul-de-sac and a true low resistance venous waveform is not
clearly identified, findings which, together, could reflect changes
of partial torsion of the right ovary. Gynecologic consultation is
recommended for further management.

## 2021-11-16 ENCOUNTER — Other Ambulatory Visit (INDEPENDENT_AMBULATORY_CARE_PROVIDER_SITE_OTHER): Payer: Self-pay | Admitting: Family Medicine

## 2021-11-16 DIAGNOSIS — E559 Vitamin D deficiency, unspecified: Secondary | ICD-10-CM

## 2021-11-22 ENCOUNTER — Ambulatory Visit (INDEPENDENT_AMBULATORY_CARE_PROVIDER_SITE_OTHER): Payer: Commercial Managed Care - PPO | Admitting: Family Medicine

## 2021-11-29 ENCOUNTER — Ambulatory Visit (INDEPENDENT_AMBULATORY_CARE_PROVIDER_SITE_OTHER): Payer: Commercial Managed Care - HMO | Admitting: Family Medicine

## 2021-11-29 ENCOUNTER — Encounter (INDEPENDENT_AMBULATORY_CARE_PROVIDER_SITE_OTHER): Payer: Self-pay | Admitting: Family Medicine

## 2021-11-29 VITALS — BP 97/65 | HR 67 | Temp 98.0°F | Ht 66.0 in | Wt 229.0 lb

## 2021-11-29 DIAGNOSIS — R7303 Prediabetes: Secondary | ICD-10-CM | POA: Diagnosis not present

## 2021-11-29 DIAGNOSIS — Z331 Pregnant state, incidental: Secondary | ICD-10-CM | POA: Insufficient documentation

## 2021-11-29 DIAGNOSIS — Z6837 Body mass index (BMI) 37.0-37.9, adult: Secondary | ICD-10-CM

## 2021-11-29 DIAGNOSIS — E669 Obesity, unspecified: Secondary | ICD-10-CM

## 2021-12-06 LAB — OB RESULTS CONSOLE GC/CHLAMYDIA
Chlamydia: NEGATIVE
Neisseria Gonorrhea: NEGATIVE

## 2021-12-14 NOTE — Progress Notes (Signed)
Chief Complaint:   OBESITY [name] is here to discuss her progress with her obesity treatment plan along with follow-up of her obesity related diagnoses. [name] is on the Category 2 Plan and states she is following her eating plan approximately 0% of the time. [name] states she is walking for 30-45 minutes 3 times per week.  Today's visit was #: 12 Starting weight: 228 lbs Starting date: 03/30/2021 Today's weight: 229 lbs Today's date: 11/29/2021 Total lbs lost to date: 0 Total lbs lost since last in-office visit: 0  Interim History: [name] recently found out she is approximately [redacted] weeks pregnant. She has an OBGYN appointment in 2 weeks. She is fighting nausea, but this is improving.   Subjective:   1. Pre-diabetes [name] stopped her metformin, and is working on avoiding  weight gain. She has not been able to follow her plan.  2. IUP (intrauterine pregnancy), incidental [name] feels she is approximately [redacted] weeks pregnant. She has stopped all of her medications and started prenatal vitamins.  Assessment/Plan:   1. Pre-diabetes [name] agreed to hold metformin until OBGYN discusses with her. She will continue to work on decreasing simple carbohydrates and increase vegetables.   2. IUP (intrauterine pregnancy), incidental [name] will continue her prenatal vitamins. Ideal weight gain per trimester was discussed and ways to minimize nausea were discussed. She is free to follow up with [place] to help maximize healthy nutrition and minimize gestational diabetes mellitus if she desires.   3. Obesity, Current BMI 37.1 [name] is currently in the action stage of change. As such, her goal is to continue with weight loss efforts. She has agreed to the Category 2 Plan.   [name] is to change her goals from weight loss to healthy weight gain for a healthy pregnancy and baby.  Exercise goals: As is.  Behavioral modification strategies: increasing lean protein intake, increasing water intake, and  meal planning and cooking strategies.  [name] has agreed to follow-up with our clinic in as needed. She was informed of the importance of frequent follow-up visits to maximize her success with intensive lifestyle modifications for her multiple health conditions.   Objective:   Blood pressure 97/65, pulse 67, temperature 98 F (36.7 C), height 5\' 6"  (1.676 m), weight 229 lb (103.9 kg), SpO2 98 %. Body mass index is 36.96 kg/m.  General: Cooperative, alert, well developed, in no acute distress. HEENT: Conjunctivae and lids unremarkable. Cardiovascular: Regular rhythm.  Lungs: Normal work of breathing. Neurologic: No focal deficits.   Lab Results  Component Value Date   CREATININE 0.94 03/30/2021   BUN 11 03/30/2021   NA 138 03/30/2021   K 4.2 03/30/2021   CL 102 03/30/2021   CO2 18 (L) 03/30/2021   Lab Results  Component Value Date   ALT 14 03/30/2021   AST 15 03/30/2021   ALKPHOS 46 03/30/2021   BILITOT 0.3 03/30/2021   Lab Results  Component Value Date   HGBA1C 4.9 08/15/2021   HGBA1C 5.0 03/30/2021   HGBA1C 5.1 12/14/2016   Lab Results  Component Value Date   INSULIN 7.5 08/15/2021   INSULIN 11.5 03/30/2021   Lab Results  Component Value Date   TSH 1.540 03/30/2021   Lab Results  Component Value Date   CHOL 183 08/15/2021   HDL 61 08/15/2021   LDLCALC 101 (H) 08/15/2021   TRIG 121 08/15/2021   CHOLHDL 3.0 08/15/2021   Lab Results  Component Value Date   VD25OH 41.3 08/15/2021   VD25OH 39.8  03/30/2021   Lab Results  Component Value Date   WBC 7.1 03/30/2021   HGB 14.4 03/30/2021   HCT 43.9 03/30/2021   MCV 89 03/30/2021   PLT 400.0 09/22/2020   No results found for: IRON, TIBC, FERRITIN  Attestation Statements:   Reviewed by clinician on day of visit: allergies, medications, problem list, medical history, surgical history, family history, social history, and previous encounter notes.  Time spent on visit including pre-visit chart review and  post-visit care and charting was 33 minutes.    I, Burt Knack, am acting as transcriptionist for Quillian Quince, MD.  I have reviewed the above documentation for accuracy and completeness, and I agree with the above. -  Quillian Quince, MD

## 2021-12-29 DIAGNOSIS — Z3480 Encounter for supervision of other normal pregnancy, unspecified trimester: Secondary | ICD-10-CM | POA: Diagnosis not present

## 2021-12-29 LAB — OB RESULTS CONSOLE HEPATITIS B SURFACE ANTIGEN: Hepatitis B Surface Ag: NEGATIVE

## 2021-12-29 LAB — OB RESULTS CONSOLE RUBELLA ANTIBODY, IGM: Rubella: NON-IMMUNE/NOT IMMUNE

## 2021-12-29 LAB — OB RESULTS CONSOLE ANTIBODY SCREEN: Antibody Screen: NEGATIVE

## 2021-12-29 LAB — OB RESULTS CONSOLE ABO/RH: RH Type: POSITIVE

## 2021-12-29 LAB — OB RESULTS CONSOLE RPR: RPR: NONREACTIVE

## 2021-12-29 LAB — HEPATITIS C ANTIBODY: HCV Ab: NEGATIVE

## 2021-12-29 LAB — OB RESULTS CONSOLE HIV ANTIBODY (ROUTINE TESTING): HIV: NONREACTIVE

## 2022-01-10 DIAGNOSIS — Z3482 Encounter for supervision of other normal pregnancy, second trimester: Secondary | ICD-10-CM | POA: Diagnosis not present

## 2022-02-09 DIAGNOSIS — Z3A19 19 weeks gestation of pregnancy: Secondary | ICD-10-CM | POA: Diagnosis not present

## 2022-02-09 DIAGNOSIS — Z363 Encounter for antenatal screening for malformations: Secondary | ICD-10-CM | POA: Diagnosis not present

## 2022-03-08 ENCOUNTER — Encounter (INDEPENDENT_AMBULATORY_CARE_PROVIDER_SITE_OTHER): Payer: Self-pay

## 2022-04-07 DIAGNOSIS — Z23 Encounter for immunization: Secondary | ICD-10-CM | POA: Diagnosis not present

## 2022-04-07 DIAGNOSIS — Z3480 Encounter for supervision of other normal pregnancy, unspecified trimester: Secondary | ICD-10-CM | POA: Diagnosis not present

## 2022-04-20 DIAGNOSIS — Z23 Encounter for immunization: Secondary | ICD-10-CM | POA: Diagnosis not present

## 2022-05-04 ENCOUNTER — Ambulatory Visit
Admission: RE | Admit: 2022-05-04 | Discharge: 2022-05-04 | Disposition: A | Discharge status: Home / Self Care | Payer: Medicaid Other | Source: Ambulatory Visit | Attending: Physician Assistant | Admitting: Physician Assistant

## 2022-05-04 VITALS — BP 114/74 | HR 84 | Temp 98.5°F | Resp 17

## 2022-05-04 DIAGNOSIS — Z1152 Encounter for screening for COVID-19: Secondary | ICD-10-CM | POA: Insufficient documentation

## 2022-05-04 DIAGNOSIS — Z331 Pregnant state, incidental: Secondary | ICD-10-CM | POA: Diagnosis not present

## 2022-05-04 DIAGNOSIS — J069 Acute upper respiratory infection, unspecified: Secondary | ICD-10-CM | POA: Diagnosis not present

## 2022-05-04 LAB — RESP PANEL BY RT-PCR (FLU A&B, COVID) ARPGX2
Influenza A by PCR: NEGATIVE
Influenza B by PCR: NEGATIVE
SARS Coronavirus 2 by RT PCR: NEGATIVE

## 2022-05-04 MED ORDER — AMOXICILLIN-POT CLAVULANATE 875-125 MG PO TABS: 1.0000 | ORAL_TABLET | Freq: Two times a day (BID) | ORAL | 0 refills | Status: DC

## 2022-05-04 NOTE — ED Triage Notes (Signed)
Pt presents with sinus congestion, facial pain, and bilateral ear fullness X 3 days.

## 2022-05-04 NOTE — ED Provider Notes (Signed)
[facility]    CSN: [id number] Arrival date & time: 05/04/22  1146      History   Chief Complaint Chief Complaint  Patient presents with   APPT: Sinuitis    HPI [name] is a 31 y.o. female.   Patient here today for evaluation of sinus congestion facial pain and bilateral ear fullness she has had for the last 3 days.  She reports she is concerned she has a sinus infection due to dental pain that she has developed.  She has not had fever that she is aware of but has had some chills.  She is currently pregnant.  She denies any nausea, vomiting or diarrhea.  She has taken some over-the-counter medication but is limited due to pregnancy. These have not been significantly improvement.   The history is provided by the patient.    Past Medical History:  Diagnosis Date   ADD (attention deficit disorder with hyperactivity)    ADHD    Anxiety    per patient-treated by [facility].   Bipolar 1 disorder ([place])    Depression    Eczema    as child   Endometriosis    HPV (human papilloma virus) infection    Pneumonia 2016   SOB (shortness of breath)     Patient Active Problem List   Diagnosis Date Noted   Pre-diabetes 11/29/2021   IUP (intrauterine pregnancy), incidental 11/29/2021   Insulin resistance 08/29/2021   Vitamin D deficiency 08/29/2021   Other hyperlipidemia 08/29/2021   Deficiency of vitamin B12 08/29/2021   VENOUS INSUFFICIENCY, MILD 07/22/2009   DYSMENORRHEA 03/03/2008   ECZEMA, ATOPIC 07/15/2007    Past Surgical History:  Procedure Laterality Date   LAPAROSCOPIC OVARIAN CYSTECTOMY Right 03/10/2020   Procedure: LAPAROSCOPIC OVARIAN CYSTECTOMY;  Surgeon: Bobbye Charleston, MD;  Location: [facility];  Service: Gynecology;  Laterality: Right;  POSSIBLE   LAPAROSCOPY Right 03/10/2020   Procedure: LAPAROSCOPY OPERATIVE AND CAUTERY OF ENDOMETRIOSIS;  Surgeon: Bobbye Charleston, MD;  Location: [facility];  Service:  Gynecology;  Laterality: Right;   TOE SURGERY Bilateral 2005   2 nd toes both sides    OB History     Gravida  1   Para  0   Term  0   Preterm  0   AB  0   Living  0      SAB  0   IAB  0   Ectopic  0   Multiple  0   Live Births  0            Home Medications    Prior to Admission medications   Medication Sig Start Date End Date Taking? Authorizing Provider  amoxicillin-clavulanate (AUGMENTIN) 875-125 MG tablet Take 1 tablet by mouth every 12 (twelve) hours. 05/04/22  Yes Francene Finders, PA-C  ADDERALL XR 20 MG 24 hr capsule  05/08/17   [provider]  brexpiprazole (REXULTI) 1 MG TABS tablet Take by mouth at bedtime.    [provider]  clonazePAM (KLONOPIN) 1 MG tablet Take 1 mg by mouth 2 (two) times daily as needed. 06/07/20   [provider]  fluticasone (FLONASE) 50 MCG/ACT nasal spray Place 1 spray into both nostrils 2 (two) times daily. Patient taking differently: Place 1 spray into both nostrils as needed. 08/11/19   [place], Betty G, MD  ibuprofen (ADVIL) 800 MG tablet Take 800 mg by mouth every 8 (eight) hours as needed.  [provider]  metFORMIN (GLUCOPHAGE) 500 MG tablet Take 1 tablet (500 mg total) by mouth 2 (two) times daily with a meal. 1 po with lunch daily 10/25/21   Dennard Nip D, MD  oseltamivir (TAMIFLU) 75 MG capsule Take 1 capsule (75 mg total) by mouth every 12 (twelve) hours. 07/08/21   Francene Finders, PA-C  traZODone (DESYREL) 150 MG tablet Take 150 mg by mouth at bedtime.    [provider]  Vitamin D, Ergocalciferol, (DRISDOL) 1.25 MG (50000 UNIT) CAPS capsule Take 1 capsule (50,000 Units total) by mouth every 7 (seven) days. 10/25/21   Starlyn Skeans, MD    Family History Family History  Problem Relation Age of Onset   Bipolar disorder Mother    Drug abuse Mother    Obesity Mother    Breast cancer Paternal Grandmother 19   Cancer Other        breast   ADD / ADHD Other      Social History Social History   Tobacco Use   Smoking status: Never   Smokeless tobacco: Never  Vaping Use   Vaping Use: Some days   Substances: Nicotine, Flavoring  Substance Use Topics   Alcohol use: No   Drug use: No     Allergies   Patient has no known allergies.   Review of Systems Review of Systems  Constitutional:  Negative for chills and fever.  HENT:  Positive for congestion and sinus pressure. Negative for ear pain and sore throat.   Eyes:  Negative for discharge and redness.  Respiratory:  Positive for cough. Negative for shortness of breath and wheezing.   Gastrointestinal:  Negative for abdominal pain, diarrhea, nausea and vomiting.     Physical Exam Triage Vital Signs ED Triage Vitals  Enc Vitals Group     BP 05/04/22 1232 114/74     Pulse Rate 05/04/22 1232 84     Resp 05/04/22 1232 17     Temp 05/04/22 1232 98.5 F (36.9 C)     Temp Source 05/04/22 1232 Oral     SpO2 05/04/22 1232 98 %     Weight --      Height --      Head Circumference --      Peak Flow --      Pain Score 05/04/22 1234 5     Pain Loc --      Pain Edu? --      Excl. in [place]? --    No data found.  Updated Vital Signs BP 114/74 (BP Location: Left Arm)   Pulse 84   Temp 98.5 F (36.9 C) (Oral)   Resp 17   LMP 06/08/2021   SpO2 98%    Physical Exam Vitals and nursing note reviewed.  Constitutional:      General: She is not in acute distress.    Appearance: Normal appearance. She is not ill-appearing.  HENT:     Head: Normocephalic and atraumatic.     Right Ear: Tympanic membrane normal.     Left Ear: Tympanic membrane normal.     Nose: Congestion present.     Mouth/Throat:     Mouth: Mucous membranes are moist.     Pharynx: No oropharyngeal exudate or posterior oropharyngeal erythema.  Eyes:     Conjunctiva/sclera: Conjunctivae normal.  Cardiovascular:     Rate and Rhythm: Normal rate and regular rhythm.     Heart sounds: Normal heart sounds. No murmur  heard. Pulmonary:  Effort: Pulmonary effort is normal. No respiratory distress.     Breath sounds: Normal breath sounds. No wheezing, rhonchi or rales.  Skin:    General: Skin is warm and dry.  Neurological:     Mental Status: She is alert.  Psychiatric:        Mood and Affect: Mood normal.        Thought Content: Thought content normal.      UC Treatments / Results  Labs (all labs ordered are listed, but only abnormal results are displayed) Labs Reviewed  RESP PANEL BY RT-PCR (FLU A&B, COVID) [id number]    EKG   Radiology No results found.  Procedures Procedures (including critical care time)  Medications Ordered in UC Medications - No data to display  Initial Impression / Assessment and Plan / UC Course  I have reviewed the triage vital signs and the nursing notes.  Pertinent labs & imaging results that were available during my care of the patient were reviewed by me and considered in my medical decision making (see chart for details).    COVID and flu screening ordered given pregnancy.  Will treat to cover sinusitis with Augmentin and encouraged follow-up if no gradual improvement or with any further concerns.  Patient expresses understanding.  Final Clinical Impressions(s) / UC Diagnoses   Final diagnoses:  IUP (intrauterine pregnancy), incidental  Encounter for screening for COVID-19  Acute upper respiratory infection   Discharge Instructions   None    ED Prescriptions     Medication Sig Dispense Auth. Provider   amoxicillin-clavulanate (AUGMENTIN) 875-125 MG tablet Take 1 tablet by mouth every 12 (twelve) hours. 14 tablet Francene Finders, PA-C      PDMP not reviewed this encounter.   Francene Finders, PA-C 05/04/22 1533

## 2022-05-12 DIAGNOSIS — Z3A32 32 weeks gestation of pregnancy: Secondary | ICD-10-CM | POA: Diagnosis not present

## 2022-05-12 DIAGNOSIS — O99213 Obesity complicating pregnancy, third trimester: Secondary | ICD-10-CM | POA: Diagnosis not present

## 2022-06-07 DIAGNOSIS — Z3480 Encounter for supervision of other normal pregnancy, unspecified trimester: Secondary | ICD-10-CM | POA: Diagnosis not present

## 2022-06-07 LAB — OB RESULTS CONSOLE GBS: GBS: NEGATIVE

## 2022-06-15 DIAGNOSIS — Z3A37 37 weeks gestation of pregnancy: Secondary | ICD-10-CM | POA: Diagnosis not present

## 2022-06-15 DIAGNOSIS — O26843 Uterine size-date discrepancy, third trimester: Secondary | ICD-10-CM | POA: Diagnosis not present

## 2022-06-21 DIAGNOSIS — Z3A38 38 weeks gestation of pregnancy: Secondary | ICD-10-CM | POA: Diagnosis not present

## 2022-06-21 DIAGNOSIS — R948 Abnormal results of function studies of other organs and systems: Secondary | ICD-10-CM | POA: Diagnosis not present

## 2022-06-26 ENCOUNTER — Encounter (HOSPITAL_COMMUNITY): Payer: Self-pay | Admitting: *Deleted

## 2022-06-26 ENCOUNTER — Telehealth (HOSPITAL_COMMUNITY): Payer: Self-pay | Admitting: *Deleted

## 2022-06-26 ENCOUNTER — Encounter (HOSPITAL_COMMUNITY): Payer: Self-pay

## 2022-06-26 NOTE — Telephone Encounter (Signed)
Preadmission screen  

## 2022-06-27 ENCOUNTER — Encounter (HOSPITAL_COMMUNITY): Payer: Self-pay | Admitting: Obstetrics and Gynecology

## 2022-06-27 ENCOUNTER — Inpatient Hospital Stay (HOSPITAL_COMMUNITY)
Admission: AD | Admit: 2022-06-27 | Discharge: 2022-06-27 | Disposition: A | Discharge status: Home / Self Care | Payer: Medicaid Other | Attending: Obstetrics and Gynecology | Admitting: Obstetrics and Gynecology

## 2022-06-27 DIAGNOSIS — O99343 Other mental disorders complicating pregnancy, third trimester: Secondary | ICD-10-CM | POA: Diagnosis not present

## 2022-06-27 DIAGNOSIS — Z3689 Encounter for other specified antenatal screening: Secondary | ICD-10-CM

## 2022-06-27 DIAGNOSIS — O26893 Other specified pregnancy related conditions, third trimester: Secondary | ICD-10-CM | POA: Insufficient documentation

## 2022-06-27 DIAGNOSIS — Z3A39 39 weeks gestation of pregnancy: Secondary | ICD-10-CM | POA: Insufficient documentation

## 2022-06-27 DIAGNOSIS — R948 Abnormal results of function studies of other organs and systems: Secondary | ICD-10-CM | POA: Diagnosis not present

## 2022-06-27 NOTE — MAU Note (Signed)
...  [name] is a 31 y.o. at [redacted]w[redacted]d here in MAU reporting: Sent over from the office for monitoring. She reports she was told "there is movement that they didn't like" on the monitors and reports they stated "why wait for an induction." She reports she is supposed to be induced this upcoming Thursday and reports she was instructed she may stay today. Denies VB or LOF. +FM.  Pain score: 9/10 pelvic pain - constant - ongoing   FHT: 125 initial external

## 2022-06-27 NOTE — MAU Provider Note (Signed)
History     CSN: [id number]  Arrival date and time: 06/27/22 1557   None     Chief Complaint  Patient presents with   FHR Monitoring   Pelvic Pain   HPI [name] is a 31 y.o. G1P0 at [redacted]w[redacted]d who presents to MAU for extended monitoring. She was in the office today and reports she was told "they didn't like the movements seen on the monitor today". She is unsure if she is suppose to be monitored or admitted for induction of labor. She denies vaginal bleeding or leaking fluid. She has had some irregular contractions and increase in pelvic/vaginal pressure. She endorses active fetal movement.   Patient receives [facility] and is scheduled for IOL on Thursday.    OB History     Gravida  1   Para  0   Term  0   Preterm  0   AB  0   Living  0      SAB  0   IAB  0   Ectopic  0   Multiple  0   Live Births  0           Past Medical History:  Diagnosis Date   ADD (attention deficit disorder with hyperactivity)    ADHD    Anxiety    per patient-treated by [facility].   Bipolar 1 disorder ([facility])    Depression    Eczema    as child   Endometriosis    HPV (human papilloma virus) infection    Pneumonia 2016   SOB (shortness of breath)     Past Surgical History:  Procedure Laterality Date   LAPAROSCOPIC OVARIAN CYSTECTOMY Right 03/10/2020   Procedure: LAPAROSCOPIC OVARIAN CYSTECTOMY;  Surgeon: Carrington Clamp, MD;  Location: [facility];  Service: Gynecology;  Laterality: Right;  POSSIBLE   LAPAROSCOPY Right 03/10/2020   Procedure: LAPAROSCOPY OPERATIVE AND CAUTERY OF ENDOMETRIOSIS;  Surgeon: Carrington Clamp, MD;  Location: [facility];  Service: Gynecology;  Laterality: Right;   TOE SURGERY Bilateral 2005   2 nd toes both sides    Family History  Problem Relation Age of Onset   Bipolar disorder Mother    Drug abuse Mother    Obesity Mother    Breast cancer Paternal Grandmother 62   Cancer Other         breast   ADD / ADHD Other     Social History   Tobacco Use   Smoking status: Never   Smokeless tobacco: Never  Vaping Use   Vaping Use: Some days   Substances: Nicotine, Flavoring  Substance Use Topics   Alcohol use: No   Drug use: No    Allergies: No Known Allergies  No medications prior to admission.   Review of Systems  Constitutional: Negative.   Gastrointestinal:  Positive for abdominal pain (contractions).  All other systems reviewed and are negative.  Physical Exam  Patient Vitals for the past 24 hrs:  BP Temp Temp src Pulse Resp SpO2  06/27/22 1824 123/69 -- -- 83 -- --  06/27/22 1637 120/77 98.4 F (36.9 C) Oral 93 19 99 %   Physical Exam Vitals and nursing note reviewed. Exam conducted with a chaperone present.  Constitutional:      General: She is not in acute distress.    Appearance: She is obese.  Eyes:     Pupils: Pupils are equal, round, and reactive to light.  Cardiovascular:  Rate and Rhythm: Normal rate.  Pulmonary:     Effort: Pulmonary effort is normal.  Abdominal:     Palpations: Abdomen is soft.     Tenderness: There is no abdominal tenderness.     Comments: Gravid    Skin:    General: Skin is warm and dry.  Neurological:     General: No focal deficit present.     Mental Status: She is alert and oriented to person, place, and time.  Psychiatric:        Mood and Affect: Mood normal.        Behavior: Behavior normal.   Dilation: 1 Effacement (%): 50 Station: Ballotable Presentation: Vertex Exam by:: Camelia Eng, CNM  NST FHR: 125 bpm, moderate variability, +15x15 accels, no decels Toco: quiet  MAU Course  Procedures  MDM NST reactive and reassuring. She endorses normal, active fetal movement. Dr. Reina Fuse was notified to clarify/discuss plan of care given that patient thought she may be staying for induction of labor. Dr. Reina Fuse reviewed fetal tracing and patient is okay for discharge home with strict return  precautions. I reviewed fetal kick counts with patient. Patient to keep IOL on Thursday.  Assessment and Plan  [redacted] weeks gestation of pregnancy NST reactive  - Discharge home in stable condition - Fetal kick counts and labor precautions - Return to MAU for new/worsening symptoms - Keep IOL as scheduled on Thursday  Brand Males, CNM 06/27/2022, 7:14 PM

## 2022-06-28 ENCOUNTER — Telehealth (HOSPITAL_COMMUNITY): Payer: Self-pay | Admitting: *Deleted

## 2022-06-28 NOTE — Telephone Encounter (Signed)
Preadmission screen  

## 2022-06-29 ENCOUNTER — Other Ambulatory Visit: Payer: Self-pay

## 2022-06-29 ENCOUNTER — Encounter (HOSPITAL_COMMUNITY): Payer: Self-pay | Admitting: Obstetrics and Gynecology

## 2022-06-29 ENCOUNTER — Inpatient Hospital Stay (HOSPITAL_COMMUNITY): Payer: Medicaid Other

## 2022-06-29 ENCOUNTER — Inpatient Hospital Stay (HOSPITAL_COMMUNITY): Payer: Medicaid Other | Admitting: Anesthesiology

## 2022-06-29 ENCOUNTER — Inpatient Hospital Stay (HOSPITAL_COMMUNITY)
Admission: AD | Admit: 2022-06-29 | Discharge: 2022-07-02 | DRG: 807 | Disposition: A | Discharge status: Home / Self Care | Payer: Medicaid Other | Attending: Obstetrics and Gynecology | Admitting: Obstetrics and Gynecology

## 2022-06-29 DIAGNOSIS — Z3A39 39 weeks gestation of pregnancy: Secondary | ICD-10-CM | POA: Diagnosis not present

## 2022-06-29 DIAGNOSIS — Z23 Encounter for immunization: Secondary | ICD-10-CM | POA: Diagnosis not present

## 2022-06-29 DIAGNOSIS — E669 Obesity, unspecified: Secondary | ICD-10-CM | POA: Diagnosis not present

## 2022-06-29 DIAGNOSIS — O26893 Other specified pregnancy related conditions, third trimester: Secondary | ICD-10-CM | POA: Diagnosis not present

## 2022-06-29 DIAGNOSIS — O99214 Obesity complicating childbirth: Secondary | ICD-10-CM | POA: Diagnosis not present

## 2022-06-29 LAB — CBC
HCT: 37.2 % (ref 36.0–46.0)
Hemoglobin: 12.4 g/dL (ref 12.0–15.0)
MCH: 28.8 pg (ref 26.0–34.0)
MCHC: 33.3 g/dL (ref 30.0–36.0)
MCV: 86.3 fL (ref 80.0–100.0)
Platelets: 234 10*3/uL (ref 150–400)
RBC: 4.31 MIL/uL (ref 3.87–5.11)
RDW: 13.7 % (ref 11.5–15.5)
WBC: 9.9 10*3/uL (ref 4.0–10.5)
nRBC: 0 % (ref 0.0–0.2)

## 2022-06-29 LAB — TYPE AND SCREEN
ABO/RH(D): A POS
Antibody Screen: NEGATIVE

## 2022-06-29 LAB — RPR: RPR Ser Ql: NONREACTIVE

## 2022-06-29 MED ORDER — TERBUTALINE SULFATE 1 MG/ML IJ SOLN: 0.2500 mg | Freq: Once | INTRAMUSCULAR | Status: DC | PRN

## 2022-06-29 MED ORDER — LACTATED RINGERS IV SOLN
500.0000 mL | INTRAVENOUS | Status: DC | PRN
  Administered 2022-06-29: 500 mL via INTRAVENOUS

## 2022-06-29 MED ORDER — PHENYLEPHRINE 80 MCG/ML (10ML) SYRINGE FOR IV PUSH (FOR BLOOD PRESSURE SUPPORT): 80.0000 ug | PREFILLED_SYRINGE | INTRAVENOUS | Status: DC | PRN

## 2022-06-29 MED ORDER — ONDANSETRON HCL 4 MG/2ML IJ SOLN
4.0000 mg | Freq: Four times a day (QID) | INTRAMUSCULAR | Status: DC | PRN
  Administered 2022-06-30: 4 mg via INTRAVENOUS
  Filled 2022-06-29: qty 2

## 2022-06-29 MED ORDER — FLEET ENEMA 7-19 GM/118ML RE ENEM: 1.0000 | ENEMA | RECTAL | Status: DC | PRN

## 2022-06-29 MED ORDER — EPHEDRINE 5 MG/ML INJ: 10.0000 mg | INTRAVENOUS | Status: DC | PRN

## 2022-06-29 MED ORDER — OXYCODONE-ACETAMINOPHEN 5-325 MG PO TABS: 2.0000 | ORAL_TABLET | ORAL | Status: DC | PRN

## 2022-06-29 MED ORDER — OXYTOCIN BOLUS FROM INFUSION
333.0000 mL | Freq: Once | INTRAVENOUS | Status: AC
  Administered 2022-06-30: 333 mL via INTRAVENOUS

## 2022-06-29 MED ORDER — OXYCODONE-ACETAMINOPHEN 5-325 MG PO TABS: 1.0000 | ORAL_TABLET | ORAL | Status: DC | PRN

## 2022-06-29 MED ORDER — LIDOCAINE HCL (PF) 1 % IJ SOLN
30.0000 mL | INTRAMUSCULAR | Status: AC | PRN
  Administered 2022-06-30: 30 mL via SUBCUTANEOUS
  Filled 2022-06-29: qty 30

## 2022-06-29 MED ORDER — DIPHENHYDRAMINE HCL 50 MG/ML IJ SOLN: 12.5000 mg | INTRAMUSCULAR | Status: DC | PRN

## 2022-06-29 MED ORDER — LACTATED RINGERS IV SOLN
500.0000 mL | Freq: Once | INTRAVENOUS | Status: AC
  Administered 2022-06-29: 500 mL via INTRAVENOUS

## 2022-06-29 MED ORDER — FENTANYL CITRATE (PF) 100 MCG/2ML IJ SOLN
INTRAMUSCULAR | Status: AC
  Administered 2022-06-29: 100 ug via INTRAVENOUS
  Filled 2022-06-29: qty 2

## 2022-06-29 MED ORDER — PHENYLEPHRINE 80 MCG/ML (10ML) SYRINGE FOR IV PUSH (FOR BLOOD PRESSURE SUPPORT)
80.0000 ug | PREFILLED_SYRINGE | INTRAVENOUS | Status: DC | PRN
  Administered 2022-06-30: 80 ug via INTRAVENOUS
  Filled 2022-06-29: qty 10

## 2022-06-29 MED ORDER — OXYTOCIN-SODIUM CHLORIDE 30-0.9 UT/500ML-% IV SOLN: 2.5000 [IU]/h | INTRAVENOUS | Status: DC

## 2022-06-29 MED ORDER — FENTANYL-BUPIVACAINE-NACL 0.5-0.125-0.9 MG/250ML-% EP SOLN
12.0000 mL/h | EPIDURAL | Status: DC | PRN
  Administered 2022-06-29: 12 mL/h via EPIDURAL
  Filled 2022-06-29: qty 250

## 2022-06-29 MED ORDER — MISOPROSTOL 25 MCG QUARTER TABLET
25.0000 ug | ORAL_TABLET | ORAL | Status: DC | PRN
  Administered 2022-06-29: 25 ug via VAGINAL
  Filled 2022-06-29: qty 1

## 2022-06-29 MED ORDER — FENTANYL CITRATE (PF) 100 MCG/2ML IJ SOLN
100.0000 ug | INTRAMUSCULAR | Status: DC | PRN
  Administered 2022-06-29: 100 ug via INTRAVENOUS
  Filled 2022-06-29: qty 2

## 2022-06-29 MED ORDER — LACTATED RINGERS IV SOLN: INTRAVENOUS | Status: DC

## 2022-06-29 MED ORDER — LIDOCAINE HCL (PF) 1 % IJ SOLN
INTRAMUSCULAR | Status: DC | PRN
  Administered 2022-06-29: 3 mL via EPIDURAL
  Administered 2022-06-29: 5 mL via EPIDURAL

## 2022-06-29 MED ORDER — SOD CITRATE-CITRIC ACID 500-334 MG/5ML PO SOLN: 30.0000 mL | ORAL | Status: DC | PRN

## 2022-06-29 MED ORDER — FLUTICASONE PROPIONATE 50 MCG/ACT NA SUSP
1.0000 | Freq: Two times a day (BID) | NASAL | Status: DC
  Filled 2022-06-29: qty 16

## 2022-06-29 MED ORDER — OXYTOCIN-SODIUM CHLORIDE 30-0.9 UT/500ML-% IV SOLN
1.0000 m[IU]/min | INTRAVENOUS | Status: DC
  Administered 2022-06-29: 2 m[IU]/min via INTRAVENOUS
  Administered 2022-06-30: 22 m[IU]/min via INTRAVENOUS
  Filled 2022-06-29 (×2): qty 500

## 2022-06-29 MED ORDER — ACETAMINOPHEN 325 MG PO TABS
650.0000 mg | ORAL_TABLET | ORAL | Status: DC | PRN
  Administered 2022-06-29 – 2022-06-30 (×2): 650 mg via ORAL
  Filled 2022-06-29 (×2): qty 2

## 2022-06-29 NOTE — Anesthesia Preprocedure Evaluation (Addendum)
Anesthesia Evaluation  Patient identified by MRN, date of birth, ID band Patient awake    Reviewed: Allergy & Precautions, NPO status , Patient's Chart, lab work & pertinent test results  History of Anesthesia Complications Negative for: history of anesthetic complications  Airway Mallampati: II  TM Distance: >3 FB Neck ROM: Full    Dental no notable dental hx.    Pulmonary neg pulmonary ROS   Pulmonary exam normal breath sounds clear to auscultation       Cardiovascular negative cardio ROS  Rhythm:Regular Rate:Normal     Neuro/Psych  PSYCHIATRIC DISORDERS (ADHD) Anxiety Depression Bipolar Disorder   negative neurological ROS     GI/Hepatic negative GI ROS, Neg liver ROS,,,  Endo/Other  prediabetes  Renal/GU negative Renal ROS     Musculoskeletal   Abdominal  (+) + obese  Peds  Hematology negative hematology ROS (+)   Anesthesia Other Findings   Reproductive/Obstetrics (+) Pregnancy                             Anesthesia Physical Anesthesia Plan  ASA: 3  Anesthesia Plan: Epidural   Post-op Pain Management:    Induction:   PONV Risk Score and Plan:   Airway Management Planned:   Additional Equipment:   Intra-op Plan:   Post-operative Plan:   Informed Consent: I have reviewed the patients History and Physical, chart, labs and discussed the procedure including the risks, benefits and alternatives for the proposed anesthesia with the patient or authorized representative who has indicated his/her understanding and acceptance.       Plan Discussed with:   Anesthesia Plan Comments: (I have discussed risks of neuraxial anesthesia including but not limited to infection, bleeding, nerve injury, back pain, headache, seizures, and failure of block. Patient denies bleeding disorders and is not currently anticoagulated. Labs have been reviewed. Risks and benefits discussed. All  patient's questions answered.  )       Anesthesia Quick Evaluation

## 2022-06-29 NOTE — Anesthesia Procedure Notes (Signed)
Epidural Patient location during procedure: OB Start time: 06/29/2022 10:35 PM End time: 06/29/2022 10:54 PM  Staffing Anesthesiologist: Linton Rump, MD Performed: anesthesiologist   Preanesthetic Checklist Completed: patient identified, IV checked, site marked, risks and benefits discussed, surgical consent, monitors and equipment checked, pre-op evaluation and timeout performed  Epidural Patient position: sitting Prep: DuraPrep and site prepped and draped Patient monitoring: continuous pulse ox and blood pressure Approach: midline Location: L3-L4 Injection technique: LOR saline  Needle:  Needle type: Tuohy  Needle gauge: 17 G Needle length: 9 cm and 9 Needle insertion depth: 7 cm Catheter type: closed end flexible Catheter size: 19 Gauge Catheter at skin depth: 11 cm Test dose: negative  Assessment Events: blood not aspirated, injection not painful, no injection resistance, no paresthesia and negative IV test  Additional Notes The patient has requested an epidural for labor pain management. Risks and benefits including, but not limited to, infection, bleeding, local anesthetic toxicity, headache, hypotension, back pain, block failure, etc. were discussed with the patient. The patient expressed understanding and consented to the procedure. I confirmed that the patient has no bleeding disorders and is not taking blood thinners. I confirmed the patient's last platelet count with the nurse. A time-out was performed immediately prior to the procedure. Please see nursing documentation for vital signs. Sterile technique was used throughout the whole procedure. Once LOR achieved, the epidural catheter threaded easily without resistance. Aspiration of the catheter was negative for blood and CSF. The epidural was dosed slowly and an infusion was started.  1 attempt(s)Reason for block:procedure for pain

## 2022-06-29 NOTE — Progress Notes (Signed)
Pt still comfortable without any pain control.  Vitals:   06/29/22 1320 06/29/22 1631 06/29/22 1735 06/29/22 1830  BP: 108/73 (!) 100/57 123/62 (!) 102/50  Pulse: 83 83 78 69  Resp:  17 18 18   Temp: 98.4 F (36.9 C)     TempSrc:      Weight:      Height:        FHTs 120s, gSTV, NST R Toco q4-7 SVE 2/70/-3, unsure if was able to AROM, no fluid seen yet  Continue pitocin.

## 2022-06-29 NOTE — Progress Notes (Signed)
Pt received IV pain meds.   Today's Vitals   06/29/22 2007 06/29/22 2030 06/29/22 2038 06/29/22 2108  BP:   131/76   Pulse:   73   Resp:   18   Temp:      TempSrc:      Weight:      Height:      PainSc: 6  3   6      FHTs 120s, gSTV, NST R Toco  q3-10  On pitocin.

## 2022-06-29 NOTE — H&P (Signed)
31 y.o. [redacted]w[redacted]d  G1P0000 comes in for elective induction at term.  Otherwise has good fetal movement and no bleeding.  Past Medical History:  Diagnosis Date   ADD (attention deficit disorder with hyperactivity)    ADHD    Anxiety    per patient-treated by [facility].   Bipolar 1 disorder (HCC)    Depression    Eczema    as child   Endometriosis    HPV (human papilloma virus) infection    Pneumonia 2016   SOB (shortness of breath)     Past Surgical History:  Procedure Laterality Date   LAPAROSCOPIC OVARIAN CYSTECTOMY Right 03/10/2020   Procedure: LAPAROSCOPIC OVARIAN CYSTECTOMY;  Surgeon: Carrington Clamp, MD;  Location: [facility] [place];  Service: Gynecology;  Laterality: Right;  POSSIBLE   LAPAROSCOPY Right 03/10/2020   Procedure: LAPAROSCOPY OPERATIVE AND CAUTERY OF ENDOMETRIOSIS;  Surgeon: Carrington Clamp, MD;  Location: [facility] [place];  Service: Gynecology;  Laterality: Right;   TOE SURGERY Bilateral 2005   2 nd toes both sides    OB History  Gravida Para Term Preterm AB Living  1 0 0 0 0 0  SAB IAB Ectopic Multiple Live Births  0 0 0 0 0    # Outcome Date GA Lbr Len/2nd Weight Sex Delivery Anes PTL Lv  1 Current             Social History   Socioeconomic History   Marital status: Single    Spouse name: Not on file   Number of children: Not on file   Years of education: Not on file   Highest education level: Not on file  Occupational History   Not on file  Tobacco Use   Smoking status: Never   Smokeless tobacco: Never  Vaping Use   Vaping Use: Some days   Substances: Nicotine, Flavoring  Substance and Sexual Activity   Alcohol use: No   Drug use: No   Sexual activity: Not Currently    Birth control/protection: None  Other Topics Concern   Not on file  Social History Narrative   Not on file   Social Determinants of Health   Financial Resource Strain: Not on file  Food Insecurity: Not on file  Transportation Needs: Not on file   Physical Activity: Not on file  Stress: Not on file  Social Connections: Not on file  Intimate Partner Violence: Not on file   Patient has no known allergies.    Prenatal Transfer Tool  Maternal Diabetes: No Genetic Screening: Normal Maternal Ultrasounds/Referrals: Normal Fetal Ultrasounds or other Referrals:  None Maternal Substance Abuse:  No Significant Maternal Medications:  None Significant Maternal Lab Results: Group B Strep negative  Other PNC: uncomplicated.    There were no vitals filed for this visit.  Lungs/Cor:  NAD Abdomen:  soft, gravid Ex:  no cords, erythema SVE:  1/50/-2 per nurse FHTs:  140s, good STV, NST R; Cat 1 tracing. Toco:  q occ   A/P   Term elective induction. Cytotec first.  GBS NEG.  Loney Laurence

## 2022-06-30 ENCOUNTER — Encounter (HOSPITAL_COMMUNITY): Payer: Self-pay | Admitting: Obstetrics and Gynecology

## 2022-06-30 DIAGNOSIS — Z3A39 39 weeks gestation of pregnancy: Secondary | ICD-10-CM | POA: Diagnosis not present

## 2022-06-30 MED ORDER — METHYLERGONOVINE MALEATE 0.2 MG PO TABS: 0.2000 mg | ORAL_TABLET | ORAL | Status: DC | PRN

## 2022-06-30 MED ORDER — MAGNESIUM HYDROXIDE 400 MG/5ML PO SUSP: 30.0000 mL | ORAL | Status: DC | PRN

## 2022-06-30 MED ORDER — OXYCODONE HCL 5 MG PO TABS
5.0000 mg | ORAL_TABLET | ORAL | Status: DC | PRN
  Administered 2022-07-01: 5 mg via ORAL
  Filled 2022-06-30: qty 1

## 2022-06-30 MED ORDER — ZOLPIDEM TARTRATE 5 MG PO TABS: 5.0000 mg | ORAL_TABLET | Freq: Every evening | ORAL | Status: DC | PRN

## 2022-06-30 MED ORDER — DIBUCAINE (PERIANAL) 1 % EX OINT: 1.0000 | TOPICAL_OINTMENT | CUTANEOUS | Status: DC | PRN

## 2022-06-30 MED ORDER — ONDANSETRON HCL 4 MG PO TABS: 4.0000 mg | ORAL_TABLET | ORAL | Status: DC | PRN

## 2022-06-30 MED ORDER — DIPHENHYDRAMINE HCL 25 MG PO CAPS: 25.0000 mg | ORAL_CAPSULE | Freq: Four times a day (QID) | ORAL | Status: DC | PRN

## 2022-06-30 MED ORDER — ACETAMINOPHEN 325 MG PO TABS
650.0000 mg | ORAL_TABLET | ORAL | Status: DC | PRN
  Administered 2022-06-30: 650 mg via ORAL
  Filled 2022-06-30: qty 2

## 2022-06-30 MED ORDER — SIMETHICONE 80 MG PO CHEW: 80.0000 mg | CHEWABLE_TABLET | ORAL | Status: DC | PRN

## 2022-06-30 MED ORDER — METHYLERGONOVINE MALEATE 0.2 MG/ML IJ SOLN: 0.2000 mg | INTRAMUSCULAR | Status: DC | PRN

## 2022-06-30 MED ORDER — SENNOSIDES-DOCUSATE SODIUM 8.6-50 MG PO TABS
2.0000 | ORAL_TABLET | Freq: Every day | ORAL | Status: DC
  Administered 2022-07-01 – 2022-07-02 (×2): 2 via ORAL
  Filled 2022-06-30 (×2): qty 2

## 2022-06-30 MED ORDER — PRENATAL MULTIVITAMIN CH
1.0000 | ORAL_TABLET | Freq: Every day | ORAL | Status: DC
  Administered 2022-07-01 – 2022-07-02 (×2): 1 via ORAL
  Filled 2022-06-30 (×2): qty 1

## 2022-06-30 MED ORDER — TETANUS-DIPHTH-ACELL PERTUSSIS 5-2.5-18.5 LF-MCG/0.5 IM SUSY: 0.5000 mL | PREFILLED_SYRINGE | Freq: Once | INTRAMUSCULAR | Status: DC

## 2022-06-30 MED ORDER — ONDANSETRON HCL 4 MG/2ML IJ SOLN: 4.0000 mg | INTRAMUSCULAR | Status: DC | PRN

## 2022-06-30 MED ORDER — IBUPROFEN 600 MG PO TABS
600.0000 mg | ORAL_TABLET | Freq: Four times a day (QID) | ORAL | Status: DC
  Administered 2022-06-30 – 2022-07-02 (×8): 600 mg via ORAL
  Filled 2022-06-30 (×8): qty 1

## 2022-06-30 MED ORDER — BENZOCAINE-MENTHOL 20-0.5 % EX AERO
1.0000 | INHALATION_SPRAY | CUTANEOUS | Status: DC | PRN
  Filled 2022-06-30: qty 56

## 2022-06-30 MED ORDER — OXYCODONE HCL 5 MG PO TABS
10.0000 mg | ORAL_TABLET | ORAL | Status: DC | PRN
  Administered 2022-07-01: 10 mg via ORAL
  Filled 2022-06-30: qty 2

## 2022-06-30 MED ORDER — WITCH HAZEL-GLYCERIN EX PADS: 1.0000 | MEDICATED_PAD | CUTANEOUS | Status: DC | PRN

## 2022-06-30 MED ORDER — MEASLES, MUMPS & RUBELLA VAC IJ SOLR
0.5000 mL | Freq: Once | INTRAMUSCULAR | Status: AC
  Administered 2022-07-02: 0.5 mL via SUBCUTANEOUS
  Filled 2022-06-30: qty 0.5

## 2022-06-30 MED ORDER — COCONUT OIL OIL: 1.0000 | TOPICAL_OIL | Status: DC | PRN

## 2022-06-30 NOTE — Progress Notes (Signed)
Comfortable with epidural Afeb, VSS FHT-130, cat I, did have some variable decels and pitocin was turned down and they resolved, ctx q 3-4 min VE-7/90/0, no membranes palpated, Vtx, IUPC inserted-clear fluid  Continue pitocin, monitor progress, anticipate SVD

## 2022-07-01 NOTE — Lactation Note (Addendum)
This note was copied from a baby's chart. Lactation Consultation Note  Patient Name: [name] [id number] Date: 07/01/2022 Reason for consult: Follow-up assessment Age:33 hours  P1,  Mother has good flow of colostrum. Had mother prepump with hand pump.  Baby continues to have difficulty sustaining latch.  He opens wide but minimal sucking pattern noted.  Baby cupping tongue and keeping it at roof of mouth.  After a few attempts, applied 20 mm nipple shield.  Prefilled with colostrum.  Baby was able to latch for 15 min.  Parents took off nipple shield half way through feeding and he maintained latch.  Set up DEBP with 24 flanges and suggest post pumping if he continues to latch with nipple shield.   Maternal Data Has patient been taught Hand Expression?: Yes Does the patient have breastfeeding experience prior to this delivery?: No  Feeding Mother's Current Feeding Choice: Breast Milk  LATCH Score Latch: Grasps breast easily, tongue down, lips flanged, rhythmical sucking.  Audible Swallowing: A few with stimulation  Type of Nipple: Everted at rest and after stimulation  Comfort (Breast/Nipple): Soft / non-tender  Hold (Positioning): Assistance needed to correctly position infant at breast and maintain latch.  LATCH Score: 8   Lactation Tools Discussed/Used Tools: Pump;Flanges;Nipple Shields Nipple shield size: 20 Flange Size: 24 Breast pump type: Double-Electric Breast Pump;Manual Pump Education: Setup, frequency, and cleaning;Milk Storage Reason for Pumping: nipple shield/support supply Pumping frequency: q 3 hours  Interventions Interventions: Assisted with latch;Skin to skin;Education;DEBP  Discharge Pump: Personal;DEBP (Medela Pump in Style)  Consult Status Consult Status: Follow-up Date: 07/02/22 Follow-up type: In-patient    Dahlia Byes [facility] 07/01/2022, 2:31 PM

## 2022-07-01 NOTE — Lactation Note (Signed)
This note was copied from a baby's chart. Lactation Consultation Note  Patient Name: [name] [id number] Date: 07/01/2022 Reason for consult: Initial assessment;Primapara;Term Age:33 hours Baby hasn't been BF. Has no interest in BF at this time. Baby gagging as if needs to have emesis. Newborn behavior, feeding habits, STS, I&O, body alignment, support reviewed. Mom encouraged to feed baby 8-12 times/24 hours and with feeding cues.  Mom stated she has frozen colostrum. Suggested she gives it to the baby tomorrow when wanting to cluster feed and is fussy wanting to feed all the time. Encouraged mom to feed on demand and try if baby hasn't cued in 3 hrs. Encouraged to call for assistance or questions.  Maternal Data Has patient been taught Hand Expression?: Yes Does the patient have breastfeeding experience prior to this delivery?: No  Feeding    LATCH Score Latch: Too sleepy or reluctant, no latch achieved, no sucking elicited.  Audible Swallowing: None  Type of Nipple: Everted at rest and after stimulation  Comfort (Breast/Nipple): Soft / non-tender  Hold (Positioning): Full assist, staff holds infant at breast  LATCH Score: 4   Lactation Tools Discussed/Used    Interventions Interventions: Breast feeding basics reviewed;Adjust position;Assisted with latch;Support pillows;Skin to skin;Position options;Breast massage;Hand express;Breast compression;LC Services brochure  Discharge    Consult Status Consult Status: Follow-up Date: 07/01/22 Follow-up type: In-patient    [name], [name] 07/01/2022, 1:25 AM

## 2022-07-01 NOTE — Lactation Note (Signed)
This note was copied from a baby's chart. Lactation Consultation Note  Patient Name: [name] [id number] Date: 07/01/2022 Reason for consult: Follow-up assessment Age:33 hours  P1, "[name]" has been sleepy at the breast but recently started latching.  Mother prefers football hold. Prior to delivery mother pumped and collected 30-1 ml syringes of colostrum. Demonstrated how to give them to "[name]" with finger and syringe or spoon. His latch is improving and he is sustaining longer.  Observed 10 min of feeding after giving colostrum.  Encouraged parents to use stored colostrum as an appetizer if she is not interested in feeding.   Recommend skin to skin. Reviewed milk storage and provided manual pump. Feed on demand with cues.  Goal 8-12+ times per day after first 24 hrs.  Place baby STS if not cueing.  Call for help as needed.   Maternal Data Has patient been taught Hand Expression?: Yes  Feeding Mother's Current Feeding Choice: Breast Milk  LATCH Score Latch: Repeated attempts needed to sustain latch, nipple held in mouth throughout feeding, stimulation needed to elicit sucking reflex.  Audible Swallowing: A few with stimulation  Type of Nipple: Everted at rest and after stimulation  Comfort (Breast/Nipple): Soft / non-tender  Hold (Positioning): Assistance needed to correctly position infant at breast and maintain latch.  LATCH Score: 7   Lactation Tools Discussed/Used    Interventions Interventions: Breast feeding basics reviewed;Assisted with latch;Skin to skin;Hand express;Expressed milk;Hand pump;Education  Discharge    Consult Status Consult Status: Follow-up Date: 07/02/22 Follow-up type: In-patient    Dahlia Byes [facility] 07/01/2022, 10:17 AM

## 2022-07-01 NOTE — Clinical Social Work Maternal (Addendum)
CLINICAL SOCIAL WORK MATERNAL/CHILD NOTE  Patient Details  Name: [name] MRN: [id number] Date of Birth: June 14, 1991  Date:  07/01/2022  Clinical Social Worker Initiating Note:  Idamae Lusher, LCSWA Date/Time: Initiated:  07/01/22/1732     Child's Name:  [name]   Biological Parents:  Mother, Father (MOB [name] DOB: [birth year] 1989, FOB: [name] DOB: 05/08/1983)   Need for Interpreter:  None   Reason for Referral:  Behavioral Health Concerns   Address:  [place] [address]    Phone number:  [phone] (home)     Additional phone number:   Household Members/Support Persons (HM/SP):   Household Member/Support Person 1, Household Member/Support Person 2   HM/SP Name Relationship DOB or Age  HM/SP -1 Zenia Resides Marzec FOB/Spouse 05/08/1983  HM/SP -2 Addison Jones Daughter 03/18/2015  HM/SP -3        HM/SP -4        HM/SP -5        HM/SP -6        HM/SP -7        HM/SP -8          Natural Supports (not living in the home):  Immediate Family   Professional Supports: Therapist   Employment: Full-time   Type of Work: Sports coach Employed, English as a second language teacher   Education:  Nurse, adult   Homebound arranged:    Museum/gallery curator Resources:  Medicaid   Other Resources:  [facility]   Cultural/Religious Considerations Which May Impact Care: None identified  Strengths:  Ability to meet basic needs  , Home prepared for child  , Pediatrician chosen   Psychotropic Medications:         Pediatrician:    [facility] area  Pediatrician List:   [place]      Pediatrician Fax Number:    Risk Factors/Current Problems:  Mental Health Concerns     Cognitive State:  Alert  , Goal Oriented  , Linear Thinking  , Able to Concentrate     Mood/Affect:  Comfortable  , Calm  , Interested  , Euthymic  , Relaxed     CSW  Assessment: CSW was consulted due to history of Bipolar Disorder and anxiety. CSW met with MOB at bedside to complete assessment. When CSW entered room, MOB was observed sitting in chair. Infant was observed sleeping on his back in bassinet. FOB was present, sitting nearby on couch. CSW introduced self and requested to speak with MOB alone. MOB provided verbal consent to complete consult with FOB present. CSW explained reasons for consult. MOB presented as calm, was agreeable to consult and remained engaged throughout encounter.   CSW inquired about MOB's mental health history. MOB denies mental health concerns during pregnancy and since giving birth. MOB reports she was diagnosed with Bipolar Disorder I in 2009, sharing she last experienced a manic episode 10+ years ago. MOB reports she and her psychiatrist both believe that she no longer fit the criteria for diagnosis of Bipolar Disorder I due to the length of remission in symptoms. MOB reports she was diagnosed with Depression and Anxiety in 2009. MOB reports she is not currently taking psychotropic medication. MOB states she she has been doing well off of medications and denies current mental health concerns. MOB reports she sees PMHNP, Noemi Chapel through [facility] and therapist, Gwen A. but  has not felt the need to meet with her psychiatrist or therapist during her pregnancy. MOB reports she also has a 33 year old adopted daughter who she raised since infancy. MOB denies a history of postpartum depression. MOB identified keeping her mind occupied as a helpful coping skill if she experiences symptoms of anxiety or depression. MOB identified FOB, and her parents who live next door as supports. MOB denied current SI/HI.  CSW provided education regarding the baby blues period vs. perinatal mood disorders, discussed treatment and gave resources for mental health follow up if concerns arise. CSW provided psycho-education about postpartum  psychosis due to MOB's history of Bipolar I disorder. CSW recommends self-evaluation during the postpartum time period using the New Mom Checklist from Postpartum Progress and encouraged MOB to contact a medical professional if symptoms are noted at any time.    MOB reports she has all needed items for infant, including a car seat and bassinet. MOB receives [facility]. MOB has chosen [facility] for infant's follow up care. MOB declined additional resource needs at at this time.   CSW provided review of Sudden Infant Death Syndrome (SIDS) precautions.    CSW identifies no further need for intervention and no barriers to discharge at this time.  CSW Plan/Description:  No Further Intervention Required/No Barriers to Discharge, Sudden Infant Death Syndrome (SIDS) Education, Perinatal Mood and Anxiety Disorder (PMADs) Education   Berniece Salines, [place] 07/01/2022, 5:37 PM

## 2022-07-01 NOTE — Progress Notes (Signed)
Post Partum Day 1 Subjective: up ad lib, voiding, and tolerating PO  Sore but contraolled on ibuprofen tylenol, and oxycodone.  Baby working on feeds and will wait until tomorrow for circumcision.   Objective: Blood pressure 102/62, pulse 62, temperature 97.9 F (36.6 C), temperature source Oral, resp. rate 18, height 5\' 6"  (1.676 m), weight 127 kg, SpO2 98 %, unknown if currently breastfeeding.  Physical Exam:  General: alert and cooperative Lochia: appropriate Uterine Fundus: firm   Recent Labs    06/29/22 0745  HGB 12.4  HCT 37.2    Assessment/Plan: Plan for discharge tomorrow D/w pt and husband circumcision and answered questions  LOS: 2 days   07/01/22, MD 07/01/2022, 9:38 AM

## 2022-07-01 NOTE — Anesthesia Postprocedure Evaluation (Signed)
Anesthesia Post Note  Patient: [name]  Procedure(s) Performed: AN AD HOC LABOR EPIDURAL     Patient location during evaluation: Mother Baby Anesthesia Type: Epidural Level of consciousness: awake, oriented and awake and alert Pain management: pain level controlled Vital Signs Assessment: post-procedure vital signs reviewed and stable Respiratory status: spontaneous breathing, respiratory function stable and nonlabored ventilation Cardiovascular status: stable Postop Assessment: no headache, adequate PO intake, able to ambulate, patient able to bend at knees and no apparent nausea or vomiting Anesthetic complications: no   No notable events documented.  Last Vitals:  Vitals:   07/01/22 0047 07/01/22 0551  BP: 102/63 102/62  Pulse: 68 62  Resp: 16 18  Temp: 36.6 C 36.6 C  SpO2: 98% 98%    Last Pain:  Vitals:   07/01/22 0929  TempSrc:   PainSc: 0-No pain   Pain Goal: Patients Stated Pain Goal: 0 (06/29/22 2007)              Epidural/Spinal Function Cutaneous sensation: Normal sensation (07/01/22 0929), Patient able to flex knees: Yes (07/01/22 0929), Patient able to lift hips off bed: Yes (07/01/22 0929), Back pain beyond tenderness at insertion site: No (07/01/22 0929), Progressively worsening motor and/or sensory loss: No (07/01/22 0929), Bowel and/or bladder incontinence post epidural: No (07/01/22 0929)  Riad Wagley

## 2022-07-01 NOTE — Progress Notes (Signed)
Post Partum Day 2 Subjective: no complaints, up ad lib, and tolerating PO  Bottom sore and needs occasional oxycodone  Objective: Blood pressure 111/68, pulse 65, temperature 98.2 F (36.8 C), temperature source Oral, resp. rate 18, height 5\' 6"  (1.676 m), weight 127 kg, SpO2 99 %, unknown if currently breastfeeding.  Physical Exam:  General: alert and cooperative Lochia: appropriate Uterine Fundus: firm   Recent Labs    06/29/22 0745  HGB 12.4  HCT 37.2    Assessment/Plan: Discharge home   LOS: 2 days   07/01/22, MD 07/01/2022, 10:16 PM

## 2022-07-02 MED ORDER — IBUPROFEN 600 MG PO TABS: 600.0000 mg | ORAL_TABLET | Freq: Four times a day (QID) | ORAL | 0 refills | Status: DC

## 2022-07-02 MED ORDER — OXYCODONE HCL 5 MG PO TABS: 5.0000 mg | ORAL_TABLET | ORAL | 0 refills | Status: DC | PRN

## 2022-07-02 MED ORDER — ACETAMINOPHEN 325 MG PO TABS: 650.0000 mg | ORAL_TABLET | ORAL | 0 refills | Status: DC | PRN

## 2022-07-02 NOTE — Lactation Note (Signed)
This note was copied from a baby's chart. Lactation Consultation Note  Patient Name: [name] [id number] Date: 07/02/2022   Age:33 hours Baby had 7% weight loss w/good feedings last 24 hrs and lots of output. Maternal Data    Feeding    LATCH Score                    Lactation Tools Discussed/Used    Interventions    Discharge    Consult Status      Charyl Dancer 07/02/2022, 6:36 AM

## 2022-07-02 NOTE — Lactation Note (Signed)
This note was copied from a baby's chart. Lactation Consultation Note  Patient Name: [name] [id number] Date: 07/02/2022 Reason for consult: Follow-up assessment;Primapara;1st time breastfeeding;Term;Infant weight loss Age:33 hours - Latch score 9  Mom latched the baby with depth flanged lips and dad helped with support. Swallows increased with breast compressions. Per  mom comfortable . Baby still feeding at 20 mins. MBURN aware to complete total time.  LC reviewed doc flow sheets with parents and updated./ WNL for age and D/C. LC reviewed BF D/C teaching and LC resources.   Maternal Data    Feeding    LATCH Score Latch: Grasps breast easily, tongue down, lips flanged, rhythmical sucking. (latched with depth, flanged lips, swallows increased with breast compressions)  Audible Swallowing: Spontaneous and intermittent (swallows noted)  Type of Nipple: Everted at rest and after stimulation  Comfort (Breast/Nipple): Filling, red/small blisters or bruises, mild/mod discomfort (per mom  breast are fuller today and comfortable with this feeding.)  Hold (Positioning): No assistance needed to correctly position infant at breast. (mom latched the baby)  LATCH Score: 9   Lactation Tools Discussed/Used    Interventions Interventions: Breast feeding basics reviewed;Breast compression;Hand pump;DEBP;Education;LC Services brochure  Discharge Discharge Education: Engorgement and breast care;Warning signs for feeding baby Pump: DEBP;Personal WIC Program: No  Consult Status Consult Status: Complete Date: 07/02/22    Kathrin Greathouse 07/02/2022, 9:50 AM

## 2022-07-02 NOTE — Discharge Summary (Signed)
Postpartum Discharge Summary       Patient Name: [name] DOB: [birth year] MRN: [id number]  Date of admission: 06/29/2022 Delivery date:06/30/2022  Delivering provider: Jackelyn Knife, TODD  Date of discharge: 07/02/2022  Admitting diagnosis: Encounter for elective induction of labor [Z34.90] Intrauterine pregnancy: [redacted]w[redacted]d     Secondary diagnosis:  Active Problems:   * No active hospital problems. *  Additional problems: none   Discharge diagnosis: Term Pregnancy Delivered                                              Post partum procedures: none Augmentation: AROM, Pitocin, and Cytotec Complications: None  Hospital course: Induction of Labor With Vaginal Delivery   31 y.o. yo G1P1001 at [redacted]w[redacted]d was admitted to the hospital 06/29/2022 for induction of labor.  Indication for induction: Elective.  Patient had an labor course complicated by maternal exhaustion with pushing. Membrane Rupture Time/Date: 7:08 PM ,06/29/2022   Delivery Method:Vaginal, Vacuum (Extractor)  Episiotomy: None  Lacerations:  2nd degree  Details of delivery can be found in separate delivery note.  Patient had an uncomplicated postpartum course.  Patient is discharged home 07/02/22.  Newborn Data: Birth date:06/30/2022  Birth time:1:16 PM  Gender:Female  Living status:Living  Apgars:8 ,9  Weight:3575 g     Physical exam  Vitals:   07/01/22 0551 07/01/22 1435 07/01/22 2112 07/02/22 0500  BP: 102/62 106/63 111/68 123/66  Pulse: 62 69 65 75  Resp: 18 16 18 16   Temp: 97.9 F (36.6 C) 98.3 F (36.8 C) 98.2 F (36.8 C) 97.8 F (36.6 C)  TempSrc: Oral  Oral Oral  SpO2: 98%  99% 98%  Weight:      Height:       General: alert and cooperative Lochia: appropriate Uterine Fundus: firm  Labs: Lab Results  Component Value Date   WBC 9.9 06/29/2022   HGB 12.4 06/29/2022   HCT 37.2 06/29/2022   MCV 86.3 06/29/2022   PLT 234 06/29/2022      Latest Ref Rng & Units 03/30/2021    9:16 AM  CMP   Glucose 65 - 99 mg/dL 84   BUN 6 - 20 mg/dL 11   Creatinine 04/01/2021 - 1.00 mg/dL 6.43   Sodium 3.29 - 518 mmol/L 138   Potassium 3.5 - 5.2 mmol/L 4.2   Chloride 96 - 106 mmol/L 102   CO2 20 - 29 mmol/L 18   Calcium 8.7 - 10.2 mg/dL 9.7   Total Protein 6.0 - 8.5 g/dL 7.0   Total Bilirubin 0.0 - 1.2 mg/dL 0.3   Alkaline Phos 44 - 121 IU/L 46   AST 0 - 40 IU/L 15   ALT 0 - 32 IU/L 14    Edinburgh Score:    06/30/2022    4:20 PM  Edinburgh Postnatal Depression Scale Screening Tool  I have been able to laugh and see the funny side of things. 0  I have looked forward with enjoyment to things. 0  I have blamed myself unnecessarily when things went wrong. 0  I have been anxious or worried for no good reason. 0  I have felt scared or panicky for no good reason. 0  Things have been getting on top of me. 0  I have been so unhappy that I have had difficulty sleeping. 0  I have felt sad or  miserable. 0  I have been so unhappy that I have been crying. 0  The thought of harming myself has occurred to me. 0  Edinburgh Postnatal Depression Scale Total 0     After visit meds:  Allergies as of 07/02/2022   No Known Allergies      Medication List     STOP taking these medications    fluticasone 50 MCG/ACT nasal spray Commonly known as: Flonase   metFORMIN 500 MG tablet Commonly known as: GLUCOPHAGE   Vitamin D (Ergocalciferol) 1.25 MG (50000 UNIT) Caps capsule Commonly known as: DRISDOL       TAKE these medications    acetaminophen 325 MG tablet Commonly known as: Tylenol Take 2 tablets (650 mg total) by mouth every 4 (four) hours as needed (for pain scale < 4).   ibuprofen 600 MG tablet Commonly known as: ADVIL Take 1 tablet (600 mg total) by mouth every 6 (six) hours.   oxyCODONE 5 MG immediate release tablet Commonly known as: Oxy IR/ROXICODONE Take 1 tablet (5 mg total) by mouth every 4 (four) hours as needed (pain scale 4-7).         Discharge home in stable  condition Infant Feeding: Breast Infant Disposition:home with mother Discharge instruction: per After Visit Summary and Postpartum booklet. Activity: Advance as tolerated. Pelvic rest for 6 weeks.  Diet: routine diet Future Appointments:No future appointments. Follow up Visit:  Follow-up Information     Ob/Gyn, Nestor Ramp. Schedule an appointment as soon as possible for a visit in 4 week(s).   Why: routine postpartum Contact information: [address] [place] [address] [phone]                  Please schedule this patient for a In person postpartum visit in 4 weeks with the following provider: Any provider.  Anticipated Birth Control:  Unsure   07/02/2022 Oliver Pila, MD

## 2022-07-08 ENCOUNTER — Telehealth (HOSPITAL_COMMUNITY): Payer: Self-pay

## 2022-07-08 NOTE — Telephone Encounter (Signed)
Patient reports feeling good. Patient declines questions/concerns about her health and healing.  Patient reports that baby is doing well. Eating, peeing/pooping, and gaining weight well. Baby sleeps in a bassinet. RN reviewed ABC's of safe sleep with patient. Patient declines any questions or concerns about baby.  EPDS score is 1.  Marcelino Duster [facility] 07/08/22,1227

## 2022-08-17 DIAGNOSIS — Z30430 Encounter for insertion of intrauterine contraceptive device: Secondary | ICD-10-CM | POA: Diagnosis not present

## 2022-08-17 DIAGNOSIS — Z3202 Encounter for pregnancy test, result negative: Secondary | ICD-10-CM | POA: Diagnosis not present

## 2023-01-13 IMAGING — DX DG TOE 5TH 2+V*L*
3 series · 3 of 3 positions shown · non-contrast
Comparison: None.

CLINICAL DATA: Left fifth toe injury

EXAM:
DG TOE 5TH LEFT

[toes dp (1 of 2)]
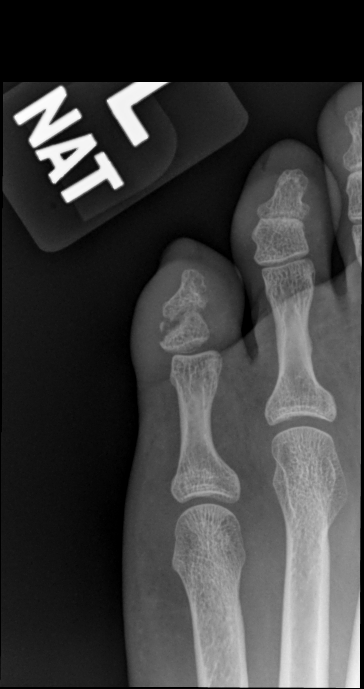

[toes dp (2 of 2)]
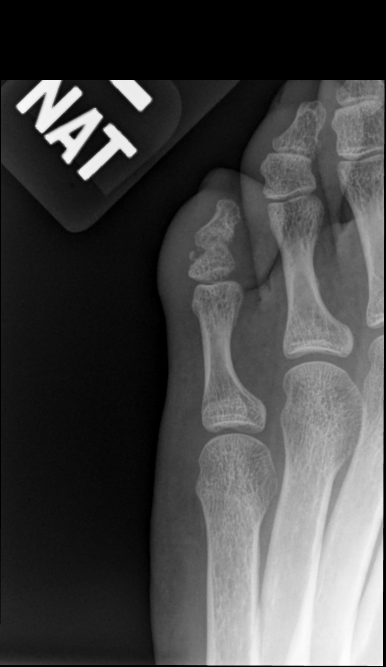

[toes lat]
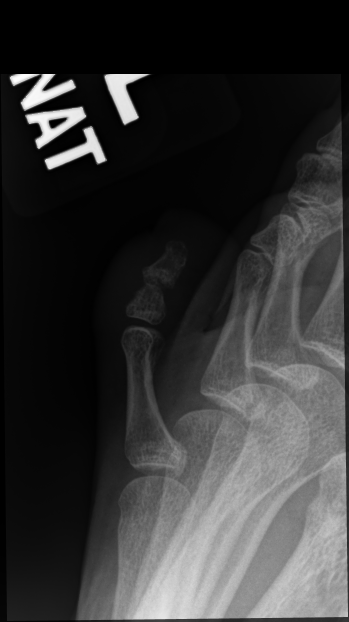

[3 of 3 positions shown; findings below may reference images not displayed]

FINDINGS: Transverse, mildly comminuted, mildly displaced fracture through the
distal phalanx of the fifth toe. No evidence of articular surface
involvement. The joint spaces are preserved.
IMPRESSION: Fracture of the distal phalanx of the fifth toe.

## 2023-02-20 DIAGNOSIS — Z0289 Encounter for other administrative examinations: Secondary | ICD-10-CM

## 2023-03-19 DIAGNOSIS — F902 Attention-deficit hyperactivity disorder, combined type: Secondary | ICD-10-CM | POA: Diagnosis not present

## 2023-03-26 ENCOUNTER — Encounter (INDEPENDENT_AMBULATORY_CARE_PROVIDER_SITE_OTHER): Payer: Self-pay | Admitting: Family Medicine

## 2023-03-26 ENCOUNTER — Ambulatory Visit (INDEPENDENT_AMBULATORY_CARE_PROVIDER_SITE_OTHER): Payer: BC Managed Care – PPO | Admitting: Family Medicine

## 2023-03-26 VITALS — BP 110/73 | HR 68 | Temp 98.1°F | Ht 66.0 in | Wt 274.0 lb

## 2023-03-26 DIAGNOSIS — F32A Depression, unspecified: Secondary | ICD-10-CM | POA: Diagnosis not present

## 2023-03-26 DIAGNOSIS — R0602 Shortness of breath: Secondary | ICD-10-CM | POA: Diagnosis not present

## 2023-03-26 DIAGNOSIS — E7849 Other hyperlipidemia: Secondary | ICD-10-CM | POA: Diagnosis not present

## 2023-03-26 DIAGNOSIS — Z1331 Encounter for screening for depression: Secondary | ICD-10-CM

## 2023-03-26 DIAGNOSIS — E559 Vitamin D deficiency, unspecified: Secondary | ICD-10-CM

## 2023-03-26 DIAGNOSIS — R7303 Prediabetes: Secondary | ICD-10-CM

## 2023-03-26 DIAGNOSIS — R5383 Other fatigue: Secondary | ICD-10-CM

## 2023-03-26 DIAGNOSIS — E669 Obesity, unspecified: Secondary | ICD-10-CM | POA: Diagnosis not present

## 2023-03-26 DIAGNOSIS — Z6841 Body Mass Index (BMI) 40.0 and over, adult: Secondary | ICD-10-CM

## 2023-03-26 NOTE — Progress Notes (Unsigned)
Chief Complaint:   OBESITY [name] (MR# [id number]) is a 32 y.o. female who presents for evaluation and treatment of obesity and related comorbidities. Current BMI is Body mass index is 44.22 kg/m. [name] has been struggling with her weight for many years and has been unsuccessful in either losing weight, maintaining weight loss, or reaching her healthy weight goal.  Patient's last visit with [place] was approximately 15 months ago.  She is ready to restart the program and she is working on healthy weight loss.  She has gained approximately 40 pounds since her last visit.  She was pregnant and gave birth since her last visit as well.  She is considering weight loss surgery.  [name] is currently in the action stage of change and ready to dedicate time achieving and maintaining a healthier weight. [name] is interested in becoming our patient and working on intensive lifestyle modifications including (but not limited to) diet and exercise for weight loss.  [name]'s habits were reviewed today and are as follows: Her family eats meals together, she thinks her family will eat healthier with her, her desired weight loss is 74 lbs, she has been heavy most of her life, she started gaining weight in 2021, she is frequently drinking liquids with calories, she frequently makes poor food choices, she has problems with excessive hunger, and she struggles with emotional eating.  Depression Screen [name]'s Food and Mood (modified PHQ-9) score was 21.  Subjective:   1. Other fatigue [name] admits to daytime somnolence and admits to waking up still tired. Patient has a history of symptoms of daytime fatigue and morning fatigue. [name] states that she has nightime awakenings. Snoring is not present. Apneic episodes are not present. Epworth Sleepiness Score is 8.   2. SOBOE (shortness of breath on exertion) [name] notes increasing shortness of breath with exercising and seems to be worsening over  time with weight gain. She notes getting out of breath sooner with activity than she used to. This has not gotten worse recently. [name] denies shortness of breath at rest or orthopnea.  3. Pre-diabetes Patient has not been on metformin recently, and she has had some weight gain.  She is at high risk of diabetes mellitus.  4. Vitamin D deficiency Patient is not currently on vitamin D, and she is due for labs.  5. Other hyperlipidemia Patient is not on statin, and she is struggling with weight gain.  Assessment/Plan:   1. Other fatigue [name] does feel that her weight is causing her energy to be lower than it should be. Fatigue may be related to obesity, depression or many other causes. Labs will be ordered, and in the meanwhile, [name] will focus on self care including making healthy food choices, increasing physical activity and focusing on stress reduction.  - EKG 12-Lead - Vitamin B12 - CBC with Differential/Platelet - TSH  2. SOBOE (shortness of breath on exertion) [name] does feel that she gets out of breath more easily that she used to when she exercises. [name]'s shortness of breath appears to be obesity related and exercise induced. She has agreed to work on weight loss and gradually increase exercise to treat her exercise induced shortness of breath. Will continue to monitor closely.  - EKG 12-Lead  3. Pre-diabetes We will check labs today.  Patient will start her category 3 plan, and we will follow-up at her next visit.  - CMP14+EGFR - Insulin, random - Hemoglobin A1c  4. Vitamin D deficiency We  will check labs today, we will follow-up at patient's next visit.  - VITAMIN D 25 Hydroxy (Vit-D Deficiency, Fractures)  5. Other hyperlipidemia We will check labs today.  Patient will start her category 3 plan, and we will follow-up at her next visit.  - Lipid Panel With LDL/HDL Ratio  6. Depression screening [name] had a positive depression screening. Depression is  commonly associated with obesity and often results in emotional eating behaviors. We will monitor this closely and work on CBT to help improve the non-hunger eating patterns. Referral to Psychology may be required if no improvement is seen as she continues in our clinic.  7. BMI 40.0-44.9, adult (HCC)  8. Obesity, Beginning BMI 44.3 [name] is currently in the action stage of change and her goal is to continue with weight loss efforts. I recommend [name] begin the structured treatment plan as follows:  She has agreed to the Category 3 Plan or keeping a food journal and adhering to recommended goals of 1300-1700 calories and 100+ grams of protein daily.  We discussed various surgery options and patient was referred to [facility] surgery to get more information.  We will continue to work with her and healthy eating in the meanwhile.  Exercise goals: No exercise has been prescribed for now, while we concentrate on nutritional changes.   Behavioral modification strategies: increasing lean protein intake.  She was informed of the importance of frequent follow-up visits to maximize her success with intensive lifestyle modifications for her multiple health conditions. She was informed we would discuss her lab results at her next visit unless there is a critical issue that needs to be addressed sooner. [name] agreed to keep her next visit at the agreed upon time to discuss these results.  Objective:   Blood pressure 110/73, pulse 68, temperature 98.1 F (36.7 C), height 5\' 6"  (1.676 m), weight 274 lb (124.3 kg), SpO2 97%, unknown if currently breastfeeding. Body mass index is 44.22 kg/m.  EKG: Normal sinus rhythm, rate 71 BPM.  Indirect Calorimeter completed today shows a VO2 of 343 and a REE of 2362.  Her calculated basal metabolic rate is 1610 thus her basal metabolic rate is better than expected.  General: Cooperative, alert, well developed, in no acute distress. HEENT: Conjunctivae and  lids unremarkable. Cardiovascular: Regular rhythm.  Lungs: Normal work of breathing. Neurologic: No focal deficits.   Lab Results  Component Value Date   CREATININE 0.94 03/30/2021   BUN 11 03/30/2021   NA 138 03/30/2021   K 4.2 03/30/2021   CL 102 03/30/2021   CO2 18 (L) 03/30/2021   Lab Results  Component Value Date   ALT 14 03/30/2021   AST 15 03/30/2021   ALKPHOS 46 03/30/2021   BILITOT 0.3 03/30/2021   Lab Results  Component Value Date   HGBA1C 4.9 08/15/2021   HGBA1C 5.0 03/30/2021   HGBA1C 5.1 12/14/2016   Lab Results  Component Value Date   INSULIN 7.5 08/15/2021   INSULIN 11.5 03/30/2021   Lab Results  Component Value Date   TSH 1.540 03/30/2021   Lab Results  Component Value Date   CHOL 183 08/15/2021   HDL 61 08/15/2021   LDLCALC 101 (H) 08/15/2021   TRIG 121 08/15/2021   CHOLHDL 3.0 08/15/2021   Lab Results  Component Value Date   WBC 9.9 06/29/2022   HGB 12.4 06/29/2022   HCT 37.2 06/29/2022   MCV 86.3 06/29/2022   PLT 234 06/29/2022   No results found for: "IRON", "  TIBC", "FERRITIN"  Attestation Statements:   Reviewed by clinician on day of visit: allergies, medications, problem list, medical history, surgical history, family history, social history, and previous encounter notes.  Time spent on visit including pre-visit chart review and post-visit charting and care was 40 minutes.   I, Burt Knack, am acting as transcriptionist for Quillian Quince, MD.  I have reviewed the above documentation for accuracy and completeness, and I agree with the above. - Quillian Quince, MD

## 2023-03-27 LAB — CMP14+EGFR
ALT: 22 IU/L (ref 0–32)
AST: 17 IU/L (ref 0–40)
Albumin: 4.6 g/dL (ref 3.9–4.9)
Alkaline Phosphatase: 67 IU/L (ref 44–121)
BUN/Creatinine Ratio: 13 (ref 9–23)
BUN: 10 mg/dL (ref 6–20)
Bilirubin Total: 0.5 mg/dL (ref 0.0–1.2)
CO2: 19 mmol/L — ABNORMAL LOW (ref 20–29)
Calcium: 9.5 mg/dL (ref 8.7–10.2)
Chloride: 104 mmol/L (ref 96–106)
Creatinine, Ser: 0.78 mg/dL (ref 0.57–1.00)
Globulin, Total: 2.7 g/dL (ref 1.5–4.5)
Glucose: 79 mg/dL (ref 70–99)
Potassium: 4.4 mmol/L (ref 3.5–5.2)
Sodium: 139 mmol/L (ref 134–144)
Total Protein: 7.3 g/dL (ref 6.0–8.5)
eGFR: 102 mL/min/{1.73_m2} (ref >59)

## 2023-03-27 LAB — VITAMIN D 25 HYDROXY (VIT D DEFICIENCY, FRACTURES): Vit D, 25-Hydroxy: 23.3 ng/mL — ABNORMAL LOW (ref 30.0–100.0)

## 2023-03-27 LAB — HEMOGLOBIN A1C
Est. average glucose Bld gHb Est-mCnc: 103 mg/dL
Hgb A1c MFr Bld: 5.2 % (ref 4.8–5.6)

## 2023-03-27 LAB — CBC WITH DIFFERENTIAL/PLATELET
Basophils Absolute: 0 10*3/uL (ref 0.0–0.2)
Basos: 0 %
EOS (ABSOLUTE): 0.1 10*3/uL (ref 0.0–0.4)
Eos: 1 %
Hematocrit: 43.2 % (ref 34.0–46.6)
Hemoglobin: 14.2 g/dL (ref 11.1–15.9)
Immature Grans (Abs): 0 10*3/uL (ref 0.0–0.1)
Immature Granulocytes: 0 %
Lymphocytes Absolute: 2.5 10*3/uL (ref 0.7–3.1)
Lymphs: 27 %
MCH: 28.2 pg (ref 26.6–33.0)
MCHC: 32.9 g/dL (ref 31.5–35.7)
MCV: 86 fL (ref 79–97)
Monocytes Absolute: 0.5 10*3/uL (ref 0.1–0.9)
Monocytes: 5 %
Neutrophils Absolute: 6.3 10*3/uL (ref 1.4–7.0)
Neutrophils: 67 %
Platelets: 394 10*3/uL (ref 150–450)
RBC: 5.04 x10E6/uL (ref 3.77–5.28)
RDW: 13 % (ref 11.7–15.4)
WBC: 9.5 10*3/uL (ref 3.4–10.8)

## 2023-03-27 LAB — INSULIN, RANDOM: INSULIN: 13.2 u[IU]/mL (ref 2.6–24.9)

## 2023-03-27 LAB — LIPID PANEL WITH LDL/HDL RATIO
Cholesterol, Total: 171 mg/dL (ref 100–199)
HDL: 36 mg/dL — ABNORMAL LOW (ref >39)
LDL Chol Calc (NIH): 106 mg/dL — ABNORMAL HIGH (ref 0–99)
LDL/HDL Ratio: 2.9 ratio (ref 0.0–3.2)
Triglycerides: 162 mg/dL — ABNORMAL HIGH (ref 0–149)
VLDL Cholesterol Cal: 29 mg/dL (ref 5–40)

## 2023-03-27 LAB — VITAMIN B12: Vitamin B-12: 462 pg/mL (ref 232–1245)

## 2023-03-27 LAB — TSH: TSH: 2.21 u[IU]/mL (ref 0.450–4.500)

## 2023-04-09 ENCOUNTER — Encounter (INDEPENDENT_AMBULATORY_CARE_PROVIDER_SITE_OTHER): Payer: Self-pay | Admitting: Family Medicine

## 2023-04-09 ENCOUNTER — Ambulatory Visit (INDEPENDENT_AMBULATORY_CARE_PROVIDER_SITE_OTHER): Payer: Commercial Managed Care - HMO | Admitting: Family Medicine

## 2023-04-09 VITALS — BP 112/72 | HR 78 | Temp 98.1°F | Ht 66.0 in | Wt 271.0 lb

## 2023-04-09 DIAGNOSIS — E559 Vitamin D deficiency, unspecified: Secondary | ICD-10-CM | POA: Diagnosis not present

## 2023-04-09 DIAGNOSIS — E669 Obesity, unspecified: Secondary | ICD-10-CM | POA: Diagnosis not present

## 2023-04-09 DIAGNOSIS — E782 Mixed hyperlipidemia: Secondary | ICD-10-CM

## 2023-04-09 DIAGNOSIS — E785 Hyperlipidemia, unspecified: Secondary | ICD-10-CM

## 2023-04-09 DIAGNOSIS — Z6841 Body Mass Index (BMI) 40.0 and over, adult: Secondary | ICD-10-CM

## 2023-04-09 DIAGNOSIS — E88819 Insulin resistance, unspecified: Secondary | ICD-10-CM | POA: Diagnosis not present

## 2023-04-09 MED ORDER — VITAMIN D (ERGOCALCIFEROL) 1.25 MG (50000 UNIT) PO CAPS: 50000.0000 [IU] | ORAL_CAPSULE | ORAL | 0 refills | Status: DC

## 2023-04-09 MED ORDER — METFORMIN HCL 500 MG PO TABS: 500.0000 mg | ORAL_TABLET | Freq: Every day | ORAL | 0 refills | Status: DC

## 2023-04-09 NOTE — Progress Notes (Signed)
.smr  Office: [phone]  /  Fax: [phone]  WEIGHT SUMMARY AND BIOMETRICS  Anthropometric Measurements Height: 5\' 6"  (1.676 m) Weight: 271 lb (122.9 kg) BMI (Calculated): 43.76 Weight at Last Visit: 274 lb Weight Lost Since Last Visit: 3 Starting Weight: 228 lb   Body Composition  Body Fat %: 46.8 % Fat Mass (lbs): 127 lbs Muscle Mass (lbs): 137.2 lbs Total Body Water (lbs): 93.6 lbs Visceral Fat Rating : 13   Other Clinical Data Fasting: no Labs: no Today's Visit #: 2 Starting Date: 03/26/23    Chief Complaint: OBESITY   Discussed the use of AI scribe software for clinical note transcription with the patient, who gave verbal consent to proceed.  History of Present Illness   The patient is a 32 year old female who presents for a follow-up consultation regarding obesity management. Over the past two weeks, the patient has lost three pounds and has been attempting to maintain a diet of 1300-1700 calories per day, with a protein goal of at least 100 grams per day. The patient reports achieving these goals approximately 50% of the time. The patient has also been engaging in physical activity, walking for about 30 minutes three times per week.  The patient has been attempting to journal food intake but has struggled with consistency, managing to journal only intermittently. Despite this, the patient reports being more mindful of food choices and portion sizes, particularly focusing on consuming protein first during meals. The patient's spouse has been assisting in maintaining accountability for these dietary changes.  The patient has also been exploring surgical options for weight management and has scheduled a consultation with a bariatric surgeon. The patient has been gathering information about different surgical procedures from personal contacts who have undergone similar surgeries. The patient is leaning towards a sleeve gastrectomy, but is open to the surgeon's  recommendations.The patient has a history of heartburn, which may influence the choice of surgical procedure.   The patient also reports a history of vitamin D deficiency and was previously on a prescription vitamin D supplement, which was discontinued during a recent pregnancy. The patient is willing to restart this supplement.  The patient has previously been on metformin for insulin resistance and is open to restarting this medication. The patient's recent lab results show an increase in insulin levels, indicating ongoing insulin resistance. The patient's cholesterol levels are slightly elevated, and the patient's vitamin D levels are low, indicating a need for supplementation. The patient's A1C and fasting glucose levels are within normal limits.          PHYSICAL EXAM:  Blood pressure 112/72, pulse 78, temperature 98.1 F (36.7 C), height 5\' 6"  (1.676 m), weight 271 lb (122.9 kg), SpO2 97%, unknown if currently breastfeeding. Body mass index is 43.74 kg/m.  DIAGNOSTIC DATA REVIEWED:  BMET    Component Value Date/Time   NA 139 03/26/2023 1108   K 4.4 03/26/2023 1108   CL 104 03/26/2023 1108   CO2 19 (L) 03/26/2023 1108   GLUCOSE 79 03/26/2023 1108   GLUCOSE 82 09/22/2020 0954   BUN 10 03/26/2023 1108   CREATININE 0.78 03/26/2023 1108   CALCIUM 9.5 03/26/2023 1108   GFRNONAA >60 03/08/2020 1529   GFRAA >60 03/08/2020 1529   Lab Results  Component Value Date   HGBA1C 5.2 03/26/2023   HGBA1C 5.1 12/14/2016   Lab Results  Component Value Date   INSULIN 13.2 03/26/2023   INSULIN 11.5 03/30/2021   Lab Results  Component Value Date  TSH 2.210 03/26/2023   CBC    Component Value Date/Time   WBC 9.5 03/26/2023 1108   WBC 9.9 06/29/2022 0745   RBC 5.04 03/26/2023 1108   RBC 4.31 06/29/2022 0745   HGB 14.2 03/26/2023 1108   HCT 43.2 03/26/2023 1108   PLT 394 03/26/2023 1108   MCV 86 03/26/2023 1108   MCH 28.2 03/26/2023 1108   MCH 28.8 06/29/2022 0745   MCHC 32.9  03/26/2023 1108   MCHC 33.3 06/29/2022 0745   RDW 13.0 03/26/2023 1108   Iron Studies No results found for: "IRON", "TIBC", "FERRITIN", "IRONPCTSAT" Lipid Panel     Component Value Date/Time   CHOL 171 03/26/2023 1108   TRIG 162 (H) 03/26/2023 1108   HDL 36 (L) 03/26/2023 1108   CHOLHDL 3.0 08/15/2021 0828   CHOLHDL 3 09/22/2020 0954   VLDL 14.2 09/22/2020 0954   LDLCALC 106 (H) 03/26/2023 1108   Hepatic Function Panel     Component Value Date/Time   PROT 7.3 03/26/2023 1108   ALBUMIN 4.6 03/26/2023 1108   AST 17 03/26/2023 1108   ALT 22 03/26/2023 1108   ALKPHOS 67 03/26/2023 1108   BILITOT 0.5 03/26/2023 1108   BILIDIR 0.1 06/08/2008 0525   IBILI 0.3 06/08/2008 0525      Component Value Date/Time   TSH 2.210 03/26/2023 1108   Nutritional Lab Results  Component Value Date   VD25OH 23.3 (L) 03/26/2023   VD25OH 41.3 08/15/2021   VD25OH 39.8 03/30/2021     Assessment and Plan    Obesity Patient has lost 3 pounds in the last two weeks. She is working on Landscape architect and mindful eating, with a calorie goal of 1300-1700 and a protein goal of at least 100 grams per day. She is also walking for 30 minutes three times per week. Patient is considering bariatric surgery and has a consultation scheduled with a bariatric surgeon. -Continue current diet and exercise regimen. -Encourage consistent food journaling. -Continue to support patient in her consideration of bariatric surgery.  Hyperlipidemia LDL cholesterol slightly elevated at 106 (goal <100). Triglycerides slightly elevated at 162 (goal <150). -Monitor cholesterol and triglycerides with lifestyle modifications and will continue to monitor her progress.  Vitamin D deficiency Patient has stopped taking prescription Vitamin D. -Restart Vitamin D 50,000 international units once a week. -Check Vitamin D levels in 3 months.  Insulin resistance Fasting insulin levels have increased, indicating insulin resistance.  Patient has previously been on Metformin. -Restart Metformin 500mg  once daily with the first meal of the day.  Follow-up Patient has a follow-up appointment already scheduled.         I have personally spent 40 minutes total time today in preparation, patient care, and documentation for this visit, including the following: review of clinical lab tests; review of medical tests/procedures/services.    She was informed of the importance of frequent follow up visits to maximize her success with intensive lifestyle modifications for her multiple health conditions.    Quillian Quince, MD

## 2023-04-17 DIAGNOSIS — Z713 Dietary counseling and surveillance: Secondary | ICD-10-CM | POA: Diagnosis not present

## 2023-04-19 DIAGNOSIS — M9908 Segmental and somatic dysfunction of rib cage: Secondary | ICD-10-CM | POA: Diagnosis not present

## 2023-04-19 DIAGNOSIS — M531 Cervicobrachial syndrome: Secondary | ICD-10-CM | POA: Diagnosis not present

## 2023-04-19 DIAGNOSIS — M9902 Segmental and somatic dysfunction of thoracic region: Secondary | ICD-10-CM | POA: Diagnosis not present

## 2023-04-19 DIAGNOSIS — M9901 Segmental and somatic dysfunction of cervical region: Secondary | ICD-10-CM | POA: Diagnosis not present

## 2023-04-20 DIAGNOSIS — Z5189 Encounter for other specified aftercare: Secondary | ICD-10-CM | POA: Diagnosis not present

## 2023-04-20 DIAGNOSIS — Z5181 Encounter for therapeutic drug level monitoring: Secondary | ICD-10-CM | POA: Diagnosis not present

## 2023-04-20 DIAGNOSIS — F349 Persistent mood [affective] disorder, unspecified: Secondary | ICD-10-CM | POA: Diagnosis not present

## 2023-04-20 DIAGNOSIS — F902 Attention-deficit hyperactivity disorder, combined type: Secondary | ICD-10-CM | POA: Diagnosis not present

## 2023-04-23 DIAGNOSIS — Z01811 Encounter for preprocedural respiratory examination: Secondary | ICD-10-CM | POA: Diagnosis not present

## 2023-04-23 DIAGNOSIS — Z01818 Encounter for other preprocedural examination: Secondary | ICD-10-CM | POA: Diagnosis not present

## 2023-04-24 ENCOUNTER — Encounter (INDEPENDENT_AMBULATORY_CARE_PROVIDER_SITE_OTHER): Payer: Self-pay | Admitting: Family Medicine

## 2023-04-24 ENCOUNTER — Ambulatory Visit (INDEPENDENT_AMBULATORY_CARE_PROVIDER_SITE_OTHER): Payer: BC Managed Care – PPO | Admitting: Family Medicine

## 2023-04-24 VITALS — BP 112/76 | HR 80 | Temp 98.3°F | Ht 66.0 in | Wt 268.0 lb

## 2023-04-24 DIAGNOSIS — Z6841 Body Mass Index (BMI) 40.0 and over, adult: Secondary | ICD-10-CM

## 2023-04-24 DIAGNOSIS — R7303 Prediabetes: Secondary | ICD-10-CM | POA: Diagnosis not present

## 2023-04-24 DIAGNOSIS — E669 Obesity, unspecified: Secondary | ICD-10-CM

## 2023-04-24 DIAGNOSIS — E559 Vitamin D deficiency, unspecified: Secondary | ICD-10-CM

## 2023-04-24 MED ORDER — METFORMIN HCL 500 MG PO TABS
500.0000 mg | ORAL_TABLET | Freq: Every day | ORAL | 0 refills | Status: DC
Start: 2023-04-24 — End: 2023-05-08

## 2023-04-24 MED ORDER — VITAMIN D (ERGOCALCIFEROL) 1.25 MG (50000 UNIT) PO CAPS
50000.0000 [IU] | ORAL_CAPSULE | ORAL | 0 refills | Status: DC
Start: 2023-04-24 — End: 2023-05-08

## 2023-04-24 NOTE — Progress Notes (Unsigned)
Chief Complaint:   OBESITY [name] is here to discuss her progress with her obesity treatment plan along with follow-up of her obesity related diagnoses. [name] is on keeping a food journal and adhering to recommended goals of 1300-1700 calories and 100+ grams of protein and states she is following her eating plan approximately 80% of the time. [name] states she is walking for 30-45 minutes 2-3 times per week.  Today's visit was #: 3 Starting weight: 274 lbs Starting date: 03/26/2023 Today's weight: 268 lbs Today's date: 04/24/2023 Total lbs lost to date: 6 Total lbs lost since last in-office visit: 3  Interim History: Patient has done well with her weight loss.  She did especially well with eating her breakfast, but she did more grab and go eating for lunch and dinner.  She has increased her walking recently.  Subjective:   1. Vitamin D deficiency Patient is on vitamin D, and her last vitamin D level was not yet at goal.  2. Prediabetes Patient is doing well on metformin with no side effects mentioned.  She is working on her diet and weight loss.  Assessment/Plan:   1. Vitamin D deficiency Patient will continue prescription vitamin D 50,000 IU once weekly, and we will refill for 1 month.  We will recheck labs in 2 months.  - Vitamin D, Ergocalciferol, (DRISDOL) 1.25 MG (50000 UNIT) CAPS capsule; Take 1 capsule (50,000 Units total) by mouth every 7 (seven) days.  Dispense: 4 capsule; Refill: 0  2. Prediabetes Patient will continue metformin 500 mg every morning, and we will refill for 1 month.  We will recheck labs in 2 months.  - metFORMIN (GLUCOPHAGE) 500 MG tablet; Take 1 tablet (500 mg total) by mouth daily with breakfast.  Dispense: 30 tablet; Refill: 0  3. BMI 40.0-44.9, adult (HCC)  4. Obesity, Beginning BMI 44.3 [name] is currently in the action stage of change. As such, her goal is to continue with weight loss efforts. She has agreed to keeping a food journal and  adhering to recommended goals of 1300-1700 calories and 100+ grams of protein daily.   Eating out handout was given.  Exercise goals: As is.   Behavioral modification strategies: increasing lean protein intake and meal planning and cooking strategies.  [name] has agreed to follow-up with our clinic in 2 to 3 weeks. She was informed of the importance of frequent follow-up visits to maximize her success with intensive lifestyle modifications for her multiple health conditions.   Objective:   Blood pressure 112/76, pulse 80, temperature 98.3 F (36.8 C), height 5\' 6"  (1.676 m), weight 268 lb (121.6 kg), SpO2 99%, unknown if currently breastfeeding. Body mass index is 43.26 kg/m.  Lab Results  Component Value Date   CREATININE 0.78 03/26/2023   BUN 10 03/26/2023   NA 139 03/26/2023   K 4.4 03/26/2023   CL 104 03/26/2023   CO2 19 (L) 03/26/2023   Lab Results  Component Value Date   ALT 22 03/26/2023   AST 17 03/26/2023   ALKPHOS 67 03/26/2023   BILITOT 0.5 03/26/2023   Lab Results  Component Value Date   HGBA1C 5.2 03/26/2023   HGBA1C 4.9 08/15/2021   HGBA1C 5.0 03/30/2021   HGBA1C 5.1 12/14/2016   Lab Results  Component Value Date   INSULIN 13.2 03/26/2023   INSULIN 7.5 08/15/2021   INSULIN 11.5 03/30/2021   Lab Results  Component Value Date   TSH 2.210 03/26/2023   Lab Results  Component Value  Date   CHOL 171 03/26/2023   HDL 36 (L) 03/26/2023   LDLCALC 106 (H) 03/26/2023   TRIG 162 (H) 03/26/2023   CHOLHDL 3.0 08/15/2021   Lab Results  Component Value Date   VD25OH 23.3 (L) 03/26/2023   VD25OH 41.3 08/15/2021   VD25OH 39.8 03/30/2021   Lab Results  Component Value Date   WBC 9.5 03/26/2023   HGB 14.2 03/26/2023   HCT 43.2 03/26/2023   MCV 86 03/26/2023   PLT 394 03/26/2023   No results found for: "IRON", "TIBC", "FERRITIN"  Attestation Statements:   Reviewed by clinician on day of visit: allergies, medications, problem list, medical history,  surgical history, family history, social history, and previous encounter notes.   I, Burt Knack, am acting as transcriptionist for Quillian Quince, MD.  I have reviewed the above documentation for accuracy and completeness, and I agree with the above. -  Quillian Quince, MD

## 2023-04-27 DIAGNOSIS — F349 Persistent mood [affective] disorder, unspecified: Secondary | ICD-10-CM | POA: Diagnosis not present

## 2023-04-27 DIAGNOSIS — F902 Attention-deficit hyperactivity disorder, combined type: Secondary | ICD-10-CM | POA: Diagnosis not present

## 2023-05-04 DIAGNOSIS — Z7689 Persons encountering health services in other specified circumstances: Secondary | ICD-10-CM | POA: Diagnosis not present

## 2023-05-04 DIAGNOSIS — R0609 Other forms of dyspnea: Secondary | ICD-10-CM | POA: Diagnosis not present

## 2023-05-04 DIAGNOSIS — Z6841 Body Mass Index (BMI) 40.0 and over, adult: Secondary | ICD-10-CM | POA: Diagnosis not present

## 2023-05-04 DIAGNOSIS — Z01810 Encounter for preprocedural cardiovascular examination: Secondary | ICD-10-CM | POA: Diagnosis not present

## 2023-05-08 ENCOUNTER — Ambulatory Visit (INDEPENDENT_AMBULATORY_CARE_PROVIDER_SITE_OTHER): Payer: BC Managed Care – PPO | Admitting: Family Medicine

## 2023-05-08 ENCOUNTER — Encounter (INDEPENDENT_AMBULATORY_CARE_PROVIDER_SITE_OTHER): Payer: Self-pay | Admitting: Family Medicine

## 2023-05-08 VITALS — BP 120/77 | HR 79 | Temp 97.9°F | Ht 66.0 in | Wt 268.0 lb

## 2023-05-08 DIAGNOSIS — E559 Vitamin D deficiency, unspecified: Secondary | ICD-10-CM

## 2023-05-08 DIAGNOSIS — Z6841 Body Mass Index (BMI) 40.0 and over, adult: Secondary | ICD-10-CM

## 2023-05-08 DIAGNOSIS — E669 Obesity, unspecified: Secondary | ICD-10-CM | POA: Diagnosis not present

## 2023-05-08 DIAGNOSIS — R7303 Prediabetes: Secondary | ICD-10-CM

## 2023-05-08 DIAGNOSIS — R632 Polyphagia: Secondary | ICD-10-CM | POA: Diagnosis not present

## 2023-05-08 MED ORDER — WEGOVY 0.25 MG/0.5ML ~~LOC~~ SOAJ: 0.2500 mg | SUBCUTANEOUS | 0 refills | Status: DC

## 2023-05-08 MED ORDER — VITAMIN D (ERGOCALCIFEROL) 1.25 MG (50000 UNIT) PO CAPS
50000.0000 [IU] | ORAL_CAPSULE | ORAL | 0 refills | Status: DC
Start: 2023-05-08 — End: 2023-06-05

## 2023-05-08 MED ORDER — METFORMIN HCL 500 MG PO TABS
500.0000 mg | ORAL_TABLET | Freq: Every day | ORAL | 0 refills | Status: DC
Start: 2023-05-08 — End: 2023-06-05

## 2023-05-08 NOTE — Progress Notes (Unsigned)
Chief Complaint:   OBESITY [name] is here to discuss her progress with her obesity treatment plan along with follow-up of her obesity related diagnoses. [name] is on keeping a food journal and adhering to recommended goals of 1300-1700 calories and 100+ grams of protein and states she is following her eating plan approximately 75% of the time. [name] states she is doing 0 minutes 0 times per week.  Today's visit was #: 4 Starting weight: 274 lbs Starting date: 03/26/2023 Today's weight: 268 lbs Today's date: 05/08/2023 Total lbs lost to date: 6 Total lbs lost since last in-office visit: 0  Interim History: Patient has done well with maintaining her weight.  She had some celebration and celebration eating.  She is planning on having weight loss surgery in January.  She is interested in GLP-1 medications.  Subjective:   1. Prediabetes Patient is stable on metformin, and she denies nausea or vomiting.  2. Vitamin D deficiency Patient's vitamin D level was not yet at goal.  She is on vitamin D, and she requests a refill.  3. Polyphagia Patient notes polyphagia which is making following her eating plan difficult.  Assessment/Plan:   1. Prediabetes Patient will continue with her diet and metformin, and we will refill metformin for 1 month.   - metFORMIN (GLUCOPHAGE) 500 MG tablet; Take 1 tablet (500 mg total) by mouth daily with breakfast.  Dispense: 30 tablet; Refill: 0   2. Vitamin D deficiency Patient will continue prescription vitamin D once weekly, and we will refill for 1 month.  - Vitamin D, Ergocalciferol, (DRISDOL) 1.25 MG (50000 UNIT) CAPS capsule; Take 1 capsule (50,000 Units total) by mouth every 7 (seven) days.  Dispense: 4 capsule; Refill: 0   3. Polyphagia Patient agreed to start Wegovy 0.25 mg once weekly with no refills.  We will reassess at her next visit in 3 to 4 weeks.  - Semaglutide-Weight Management (WEGOVY) 0.25 MG/0.5 ML SOAJ; Inject 0.25 mg into the  skin once a week.  Dispense: 2 mL; Refill: 0   4. BMI 40.0-44.9, adult (HCC)  5. Obesity, Beginning BMI 44.3 [name] is currently in the action stage of change. As such, her goal is to continue with weight loss efforts. She has agreed to keeping a food journal and adhering to recommended goals of 1300-1700 calories and 100+ grams of protein daily.   Mechanisms of how medications work with pros and cons were discussed.  Recipes were given today.  Behavioral modification strategies: meal planning and cooking strategies.  [name] has agreed to follow-up with our clinic in 4 weeks. She was informed of the importance of frequent follow-up visits to maximize her success with intensive lifestyle modifications for her multiple health conditions.   Objective:   Blood pressure 120/77, pulse 79, temperature 97.9 F (36.6 C), height 5\' 6"  (1.676 m), weight 268 lb (121.6 kg), SpO2 98%, unknown if currently breastfeeding. Body mass index is 43.26 kg/m.  Lab Results  Component Value Date   CREATININE 0.78 03/26/2023   BUN 10 03/26/2023   NA 139 03/26/2023   K 4.4 03/26/2023   CL 104 03/26/2023   CO2 19 (L) 03/26/2023   Lab Results  Component Value Date   ALT 22 03/26/2023   AST 17 03/26/2023   ALKPHOS 67 03/26/2023   BILITOT 0.5 03/26/2023   Lab Results  Component Value Date   HGBA1C 5.2 03/26/2023   HGBA1C 4.9 08/15/2021   HGBA1C 5.0 03/30/2021   HGBA1C 5.1 12/14/2016  Lab Results  Component Value Date   INSULIN 13.2 03/26/2023   INSULIN 7.5 08/15/2021   INSULIN 11.5 03/30/2021   Lab Results  Component Value Date   TSH 2.210 03/26/2023   Lab Results  Component Value Date   CHOL 171 03/26/2023   HDL 36 (L) 03/26/2023   LDLCALC 106 (H) 03/26/2023   TRIG 162 (H) 03/26/2023   CHOLHDL 3.0 08/15/2021   Lab Results  Component Value Date   VD25OH 23.3 (L) 03/26/2023   VD25OH 41.3 08/15/2021   VD25OH 39.8 03/30/2021   Lab Results  Component Value Date   WBC 9.5 03/26/2023    HGB 14.2 03/26/2023   HCT 43.2 03/26/2023   MCV 86 03/26/2023   PLT 394 03/26/2023   No results found for: "IRON", "TIBC", "FERRITIN"  Attestation Statements:   Reviewed by clinician on day of visit: allergies, medications, problem list, medical history, surgical history, family history, social history, and previous encounter notes.   I, Burt Knack, am acting as transcriptionist for Quillian Quince, MD.  I have reviewed the above documentation for accuracy and completeness, and I agree with the above. -  Quillian Quince, MD

## 2023-05-09 ENCOUNTER — Encounter (INDEPENDENT_AMBULATORY_CARE_PROVIDER_SITE_OTHER): Payer: Self-pay | Admitting: Family Medicine

## 2023-05-10 ENCOUNTER — Telehealth (INDEPENDENT_AMBULATORY_CARE_PROVIDER_SITE_OTHER): Payer: Self-pay | Admitting: *Deleted

## 2023-05-10 NOTE — Telephone Encounter (Signed)
PA FOR WEGOVY SUBMITTED VIA [employer].   [name] (Key: [employer])  [employer] is reviewing your PA request and will respond within 24 hours for Medicaid or up to 72 hours for non-Medicaid plans, based on the required timeframe determined by state or federal regulations. To check for an update later, open this request from your dashboard.

## 2023-05-15 ENCOUNTER — Telehealth (INDEPENDENT_AMBULATORY_CARE_PROVIDER_SITE_OTHER): Payer: Self-pay | Admitting: Family Medicine

## 2023-05-15 NOTE — Telephone Encounter (Signed)
Pt called stating that she needs Wegovy. [employer] denied the [facility] because pt is taking Metformin. Pt wants a call back from Dr. Dalbert Garnet. Pt states that a Prior Authorization is needed for [facility]. Patient has requested a call from Dr. Dalbert Garnet directly. Pt states that she has been trying to get someone through MyChart, but no one will message her back.

## 2023-05-16 NOTE — Telephone Encounter (Signed)
Case Id: [id number] Status: Approved Review Type: Prior Auth Coverage Start Date :04/16/2023 Coverage End Date:12/12/2023;

## 2023-05-16 NOTE — Telephone Encounter (Signed)
Darreld Mclean, please discuss this with Okey Regal

## 2023-05-16 NOTE — Telephone Encounter (Signed)
Please see message dated 05/10/2023.  [name] has been approved.  Patient has been notified.

## 2023-05-17 DIAGNOSIS — Z7689 Persons encountering health services in other specified circumstances: Secondary | ICD-10-CM | POA: Diagnosis not present

## 2023-05-17 DIAGNOSIS — R0609 Other forms of dyspnea: Secondary | ICD-10-CM | POA: Diagnosis not present

## 2023-05-17 DIAGNOSIS — R7303 Prediabetes: Secondary | ICD-10-CM | POA: Diagnosis not present

## 2023-05-17 DIAGNOSIS — Z01810 Encounter for preprocedural cardiovascular examination: Secondary | ICD-10-CM | POA: Diagnosis not present

## 2023-05-17 DIAGNOSIS — Z713 Dietary counseling and surveillance: Secondary | ICD-10-CM | POA: Diagnosis not present

## 2023-05-17 NOTE — Telephone Encounter (Signed)
Please refer to other encounter for further information.

## 2023-05-22 ENCOUNTER — Other Ambulatory Visit (HOSPITAL_COMMUNITY): Payer: Self-pay

## 2023-05-22 ENCOUNTER — Encounter (INDEPENDENT_AMBULATORY_CARE_PROVIDER_SITE_OTHER): Payer: Self-pay | Admitting: Family Medicine

## 2023-05-22 ENCOUNTER — Ambulatory Visit (INDEPENDENT_AMBULATORY_CARE_PROVIDER_SITE_OTHER): Payer: BC Managed Care – PPO | Admitting: Family Medicine

## 2023-05-22 VITALS — BP 115/70 | HR 69 | Temp 98.0°F | Ht 66.0 in | Wt 267.0 lb

## 2023-05-22 DIAGNOSIS — E669 Obesity, unspecified: Secondary | ICD-10-CM

## 2023-05-22 DIAGNOSIS — Z6841 Body Mass Index (BMI) 40.0 and over, adult: Secondary | ICD-10-CM

## 2023-05-22 DIAGNOSIS — R632 Polyphagia: Secondary | ICD-10-CM | POA: Diagnosis not present

## 2023-05-22 DIAGNOSIS — R7303 Prediabetes: Secondary | ICD-10-CM

## 2023-05-22 MED ORDER — WEGOVY 0.25 MG/0.5ML ~~LOC~~ SOAJ
0.2500 mg | SUBCUTANEOUS | 0 refills | Status: DC
  Filled 2023-05-22 (×2): qty 2, 28d supply, fill #0

## 2023-05-22 NOTE — Progress Notes (Signed)
Chief Complaint:   OBESITY [name] is here to discuss her progress with her obesity treatment plan along with follow-up of her obesity related diagnoses. [name] is on keeping a food journal and adhering to recommended goals of 1300-1700 calories and 100+ grams of protein and states she is following her eating plan approximately 50% of the time. [name] states she is walking for 30-45 minutes 2 times per week.  Today's visit was #: 5 Starting weight: 274 lbs Starting date: 03/26/2023 Today's weight: 267 lbs Today's date: 05/22/2023 Total lbs lost to date: 7 Total lbs lost since last in-office visit: 1  Interim History: Patient has done well with weight loss.  She has struggled more with journaling recently.  She has tried to be mindful to make healthier choices.  She feels she does better with meeting her calorie goals versus protein.  Subjective:   1. Polyphagia Patient was unable to start [facility] yet due to pharmacy issues.  She would like to send the prescription to [facility].  2. Prediabetes Patient is stable on metformin, and she is working on her diet.  She denies nausea or vomiting.  Assessment/Plan:   1. Polyphagia We will send Wegovy 0.25 mg once weekly 1 month supply to [facility].  - Semaglutide-Weight Management (WEGOVY) 0.25 MG/0.5ML SOAJ; Inject 0.25 mg into the skin once a week.  Dispense: 2 mL; Refill: 0  2. Prediabetes Patient will continue metformin and her diet, and we will recheck labs in 2 months.  3. BMI 40.0-44.9, adult (HCC)  4. Obesity, Beginning BMI 44.3 [name] is currently in the action stage of change. As such, her goal is to continue with weight loss efforts. She has agreed to keeping a food journal and adhering to recommended goals of 1300-1700 calories and 100+ grams of protein daily.   Exercise goals: As is.   Behavioral modification strategies: increasing lean protein intake.  [name] has agreed to follow-up with our  clinic in 2 to 3 weeks. She was informed of the importance of frequent follow-up visits to maximize her success with intensive lifestyle modifications for her multiple health conditions.   Objective:   Blood pressure 115/70, pulse 69, temperature 98 F (36.7 C), height 5\' 6"  (1.676 m), weight 267 lb (121.1 kg), SpO2 98%, unknown if currently breastfeeding. Body mass index is 43.09 kg/m.  Lab Results  Component Value Date   CREATININE 0.78 03/26/2023   BUN 10 03/26/2023   NA 139 03/26/2023   K 4.4 03/26/2023   CL 104 03/26/2023   CO2 19 (L) 03/26/2023   Lab Results  Component Value Date   ALT 22 03/26/2023   AST 17 03/26/2023   ALKPHOS 67 03/26/2023   BILITOT 0.5 03/26/2023   Lab Results  Component Value Date   HGBA1C 5.2 03/26/2023   HGBA1C 4.9 08/15/2021   HGBA1C 5.0 03/30/2021   HGBA1C 5.1 12/14/2016   Lab Results  Component Value Date   INSULIN 13.2 03/26/2023   INSULIN 7.5 08/15/2021   INSULIN 11.5 03/30/2021   Lab Results  Component Value Date   TSH 2.210 03/26/2023   Lab Results  Component Value Date   CHOL 171 03/26/2023   HDL 36 (L) 03/26/2023   LDLCALC 106 (H) 03/26/2023   TRIG 162 (H) 03/26/2023   CHOLHDL 3.0 08/15/2021   Lab Results  Component Value Date   VD25OH 23.3 (L) 03/26/2023   VD25OH 41.3 08/15/2021   VD25OH 39.8 03/30/2021   Lab Results  Component Value Date   WBC 9.5 03/26/2023   HGB 14.2 03/26/2023   HCT 43.2 03/26/2023   MCV 86 03/26/2023   PLT 394 03/26/2023   No results found for: "IRON", "TIBC", "FERRITIN"  Attestation Statements:   Reviewed by clinician on day of visit: allergies, medications, problem list, medical history, surgical history, family history, social history, and previous encounter notes.   I, Burt Knack, am acting as transcriptionist for Quillian Quince, MD.  I have reviewed the above documentation for accuracy and completeness, and I agree with the above. -  Quillian Quince, MD

## 2023-05-25 ENCOUNTER — Other Ambulatory Visit (HOSPITAL_COMMUNITY): Payer: Self-pay

## 2023-05-28 ENCOUNTER — Other Ambulatory Visit (INDEPENDENT_AMBULATORY_CARE_PROVIDER_SITE_OTHER): Payer: Self-pay | Admitting: Family Medicine

## 2023-05-28 DIAGNOSIS — E559 Vitamin D deficiency, unspecified: Secondary | ICD-10-CM

## 2023-06-05 ENCOUNTER — Other Ambulatory Visit (HOSPITAL_COMMUNITY): Payer: Self-pay

## 2023-06-05 ENCOUNTER — Encounter (INDEPENDENT_AMBULATORY_CARE_PROVIDER_SITE_OTHER): Payer: Self-pay | Admitting: Family Medicine

## 2023-06-05 ENCOUNTER — Ambulatory Visit (INDEPENDENT_AMBULATORY_CARE_PROVIDER_SITE_OTHER): Payer: BC Managed Care – PPO | Admitting: Family Medicine

## 2023-06-05 VITALS — BP 110/62 | HR 70 | Temp 98.1°F | Ht 66.0 in | Wt 265.0 lb

## 2023-06-05 DIAGNOSIS — R7303 Prediabetes: Secondary | ICD-10-CM

## 2023-06-05 DIAGNOSIS — E559 Vitamin D deficiency, unspecified: Secondary | ICD-10-CM

## 2023-06-05 DIAGNOSIS — R632 Polyphagia: Secondary | ICD-10-CM | POA: Diagnosis not present

## 2023-06-05 DIAGNOSIS — E669 Obesity, unspecified: Secondary | ICD-10-CM | POA: Diagnosis not present

## 2023-06-05 DIAGNOSIS — Z6841 Body Mass Index (BMI) 40.0 and over, adult: Secondary | ICD-10-CM

## 2023-06-05 MED ORDER — METFORMIN HCL 500 MG PO TABS: 500.0000 mg | ORAL_TABLET | Freq: Every day | ORAL | 0 refills | Status: DC

## 2023-06-05 MED ORDER — VITAMIN D (ERGOCALCIFEROL) 1.25 MG (50000 UNIT) PO CAPS
50000.0000 [IU] | ORAL_CAPSULE | ORAL | 0 refills | Status: DC
Start: 2023-06-05 — End: 2023-07-10

## 2023-06-05 MED ORDER — WEGOVY 0.25 MG/0.5ML ~~LOC~~ SOAJ
0.2500 mg | SUBCUTANEOUS | 0 refills | Status: DC
  Filled 2023-06-05 – 2023-06-19 (×3): qty 2, 28d supply, fill #0

## 2023-06-05 NOTE — Progress Notes (Signed)
.smr  Office: [phone]  /  Fax: [phone]  WEIGHT SUMMARY AND BIOMETRICS  Anthropometric Measurements Height: 5\' 6"  (1.676 m) Weight: 265 lb (120.2 kg) BMI (Calculated): 42.79 Weight at Last Visit: 267 lb Weight Lost Since Last Visit: 2 lb Weight Gained Since Last Visit: 0 Starting Weight: 274 lb Total Weight Loss (lbs): 9 lb (4.082 kg)   Body Composition  Body Fat %: 46.6 % Fat Mass (lbs): 123.8 lbs Muscle Mass (lbs): 134.8 lbs Total Body Water (lbs): 93 lbs Visceral Fat Rating : 13   Other Clinical Data Fasting: No Labs: No Today's Visit #: 6 Starting Date: 03/26/23    Chief Complaint: OBESITY   History of Present Illness   The patient, diagnosed with vitamin D deficiency, prediabetes, and obesity with polyphasia, has been on a regimen of prescription vitamin D (50,000 international units weekly), metformin (500 mg daily), and Wegovy. Over the past three weeks, she has lost two pounds and has been working on portion control and making healthier food choices. She has also started walking twice a week.  The patient reported a significant challenge in maintaining her diet during a recent race event due to the lack of healthy food options. Despite this, she has noticed a decrease in her hunger levels, attributing this change to the [facility]. She reported feeling full after consuming half of her usual portion size, a new experience for her.  The patient has been trying to keep track of her food intake mentally, as she has struggled with maintaining a food journal.          PHYSICAL EXAM:  Blood pressure 110/62, pulse 70, temperature 98.1 F (36.7 C), height 5\' 6"  (1.676 m), weight 265 lb (120.2 kg), SpO2 99%, unknown if currently breastfeeding. Body mass index is 42.77 kg/m.  DIAGNOSTIC DATA REVIEWED:  BMET    Component Value Date/Time   NA 139 03/26/2023 1108   K 4.4 03/26/2023 1108   CL 104 03/26/2023 1108   CO2 19 (L) 03/26/2023 1108   GLUCOSE 79  03/26/2023 1108   GLUCOSE 82 09/22/2020 0954   BUN 10 03/26/2023 1108   CREATININE 0.78 03/26/2023 1108   CALCIUM 9.5 03/26/2023 1108   GFRNONAA >60 03/08/2020 1529   GFRAA >60 03/08/2020 1529   Lab Results  Component Value Date   HGBA1C 5.2 03/26/2023   HGBA1C 5.1 12/14/2016   Lab Results  Component Value Date   INSULIN 13.2 03/26/2023   INSULIN 11.5 03/30/2021   Lab Results  Component Value Date   TSH 2.210 03/26/2023   CBC    Component Value Date/Time   WBC 9.5 03/26/2023 1108   WBC 9.9 06/29/2022 0745   RBC 5.04 03/26/2023 1108   RBC 4.31 06/29/2022 0745   HGB 14.2 03/26/2023 1108   HCT 43.2 03/26/2023 1108   PLT 394 03/26/2023 1108   MCV 86 03/26/2023 1108   MCH 28.2 03/26/2023 1108   MCH 28.8 06/29/2022 0745   MCHC 32.9 03/26/2023 1108   MCHC 33.3 06/29/2022 0745   RDW 13.0 03/26/2023 1108   Iron Studies No results found for: "IRON", "TIBC", "FERRITIN", "IRONPCTSAT" Lipid Panel     Component Value Date/Time   CHOL 171 03/26/2023 1108   TRIG 162 (H) 03/26/2023 1108   HDL 36 (L) 03/26/2023 1108   CHOLHDL 3.0 08/15/2021 0828   CHOLHDL 3 09/22/2020 0954   VLDL 14.2 09/22/2020 0954   LDLCALC 106 (H) 03/26/2023 1108   Hepatic Function Panel     Component  Value Date/Time   PROT 7.3 03/26/2023 1108   ALBUMIN 4.6 03/26/2023 1108   AST 17 03/26/2023 1108   ALT 22 03/26/2023 1108   ALKPHOS 67 03/26/2023 1108   BILITOT 0.5 03/26/2023 1108   BILIDIR 0.1 06/08/2008 0525   IBILI 0.3 06/08/2008 0525      Component Value Date/Time   TSH 2.210 03/26/2023 1108   Nutritional Lab Results  Component Value Date   VD25OH 23.3 (L) 03/26/2023   VD25OH 41.3 08/15/2021   VD25OH 39.8 03/30/2021     Assessment and Plan    Obesity with Polyphagia Noted 2-pound weight loss since last visit. Patient reports decreased hunger and portion sizes since starting [facility]. Discussed strategies for maintaining healthy eating habits during social events and  holidays. -Continue Wegovy for obesity management. -Encourage continued focus on portion control and making healthy choices. -Continue walking 2 times per week. -Consider journaling to track protein intake and meet protein goal of 100g/day.  Prediabetes Patient is on Metformin 500mg  daily. -Continue Metformin 500mg  daily. -Check HbA1c at next visit to monitor glucose control.  Vitamin D Deficiency Patient is on prescription Vitamin D 50,000 IU weekly. -Continue Vitamin D 50,000 IU weekly. -Check Vitamin D levels at next visit to assess response to therapy.  Follow-up Patient has follow-up appointments scheduled through Christmas. Plan to discuss January appointments at next visit.        She was informed of the importance of frequent follow up visits to maximize her success with intensive lifestyle modifications for her multiple health conditions.    Quillian Quince, MD

## 2023-06-14 ENCOUNTER — Other Ambulatory Visit (HOSPITAL_COMMUNITY): Payer: Self-pay

## 2023-06-18 ENCOUNTER — Telehealth (INDEPENDENT_AMBULATORY_CARE_PROVIDER_SITE_OTHER): Payer: Self-pay | Admitting: Family Medicine

## 2023-06-18 NOTE — Telephone Encounter (Signed)
11/18 Pt called to confirm that her Wegovy 0.5 was sent to the [facility] on [address]. Please call the pt notify when r/x has been sent.

## 2023-06-18 NOTE — Telephone Encounter (Signed)
Dr Dalbert Garnet, patient is asking for [facility] 0.5 mg.  The prescription sent in on 06/05/23 is for the Wegovy 0.25 mg.  I read your note and it did not say you wanted to increase the medication, but to continue.  Did you want to increase her or stay the same.  Please advise.  Thank you.

## 2023-06-18 NOTE — Telephone Encounter (Signed)
No, I do not adjust doses in between visits, but we can discuss at her next follow up.

## 2023-06-19 ENCOUNTER — Other Ambulatory Visit (HOSPITAL_COMMUNITY): Payer: Self-pay

## 2023-06-20 ENCOUNTER — Other Ambulatory Visit (HOSPITAL_COMMUNITY): Payer: Self-pay

## 2023-07-10 ENCOUNTER — Ambulatory Visit (INDEPENDENT_AMBULATORY_CARE_PROVIDER_SITE_OTHER): Payer: BC Managed Care – PPO | Admitting: Family Medicine

## 2023-07-10 ENCOUNTER — Other Ambulatory Visit (HOSPITAL_COMMUNITY): Payer: Self-pay

## 2023-07-10 ENCOUNTER — Encounter (INDEPENDENT_AMBULATORY_CARE_PROVIDER_SITE_OTHER): Payer: Self-pay | Admitting: Family Medicine

## 2023-07-10 VITALS — BP 112/74 | HR 72 | Temp 98.0°F | Ht 66.0 in | Wt 260.0 lb

## 2023-07-10 DIAGNOSIS — E559 Vitamin D deficiency, unspecified: Secondary | ICD-10-CM | POA: Diagnosis not present

## 2023-07-10 DIAGNOSIS — Z6841 Body Mass Index (BMI) 40.0 and over, adult: Secondary | ICD-10-CM

## 2023-07-10 DIAGNOSIS — E669 Obesity, unspecified: Secondary | ICD-10-CM

## 2023-07-10 DIAGNOSIS — R632 Polyphagia: Secondary | ICD-10-CM | POA: Diagnosis not present

## 2023-07-10 DIAGNOSIS — R7303 Prediabetes: Secondary | ICD-10-CM | POA: Diagnosis not present

## 2023-07-10 DIAGNOSIS — E782 Mixed hyperlipidemia: Secondary | ICD-10-CM

## 2023-07-10 MED ORDER — METFORMIN HCL 500 MG PO TABS
500.0000 mg | ORAL_TABLET | Freq: Every day | ORAL | 0 refills | Status: AC
  Filled 2023-07-10: qty 30, 30d supply, fill #0

## 2023-07-10 MED ORDER — WEGOVY 0.25 MG/0.5ML ~~LOC~~ SOAJ
0.2500 mg | SUBCUTANEOUS | 0 refills | Status: AC
  Filled 2023-07-10 – 2023-07-16 (×3): qty 2, 28d supply, fill #0

## 2023-07-10 MED ORDER — VITAMIN D (ERGOCALCIFEROL) 1.25 MG (50000 UNIT) PO CAPS
50000.0000 [IU] | ORAL_CAPSULE | ORAL | 0 refills | Status: AC
  Filled 2023-07-10: qty 4, 28d supply, fill #0

## 2023-07-10 NOTE — Progress Notes (Signed)
.smr  Office: [phone]  /  Fax: [phone]  WEIGHT SUMMARY AND BIOMETRICS  Anthropometric Measurements Height: 5\' 6"  (1.676 m) Weight: 260 lb (117.9 kg) BMI (Calculated): 41.99 Weight at Last Visit: 265 lb Weight Lost Since Last Visit: 5 lb Weight Gained Since Last Visit: 0 Starting Weight: 274 lb Total Weight Loss (lbs): 14 lb (6.35 kg) Peak Weight: 274 lb   Body Composition  Body Fat %: 46.2 % Fat Mass (lbs): 120.4 lbs Muscle Mass (lbs): 133.2 lbs Total Body Water (lbs): 92.4 lbs Visceral Fat Rating : 12   Other Clinical Data Fasting: no Labs: no Today's Visit #: 7 Starting Date: 03/26/23    Chief Complaint: OBESITY   History of Present Illness   The patient, with a history of obesity and prediabetes, presents for a follow-up visit. She has been on metformin and Wegovy, and has lost five pounds in the past month. She reports adherence to her category two eating plan about 85% of the time, but is not currently exercising.  The patient has noticed a significant amount of weight loss, with her clothes fitting more loosely. She has been focusing on consuming protein first in her meals, as she has a preference for carbohydrates and starches. She reports that the [facility] has been helpful, as it signals when she is full. Initially, she experienced nausea when she did not heed this signal, but has since learned to stop eating when she feels full.  She has noticed good results from low doses of Wegovy and has learned to pay attention to her body's signals of satiety.  The patient has been managing her eating habits well, even during the holiday season. She has noticed that she cannot eat a full meal anymore, especially when on the road. She has been making healthier choices when eating out, opting for grilled nuggets or salads at fast food restaurants. She has also been mindful of not setting a precedent of excess for her one-year-old child.          PHYSICAL  EXAM:  Blood pressure 112/74, pulse 72, temperature 98 F (36.7 C), height 5\' 6"  (1.676 m), weight 260 lb (117.9 kg), SpO2 97%, unknown if currently breastfeeding. Body mass index is 41.97 kg/m.  DIAGNOSTIC DATA REVIEWED:  BMET    Component Value Date/Time   NA 139 03/26/2023 1108   K 4.4 03/26/2023 1108   CL 104 03/26/2023 1108   CO2 19 (L) 03/26/2023 1108   GLUCOSE 79 03/26/2023 1108   GLUCOSE 82 09/22/2020 0954   BUN 10 03/26/2023 1108   CREATININE 0.78 03/26/2023 1108   CALCIUM 9.5 03/26/2023 1108   GFRNONAA >60 03/08/2020 1529   GFRAA >60 03/08/2020 1529   Lab Results  Component Value Date   HGBA1C 5.2 03/26/2023   HGBA1C 5.1 12/14/2016   Lab Results  Component Value Date   INSULIN 13.2 03/26/2023   INSULIN 11.5 03/30/2021   Lab Results  Component Value Date   TSH 2.210 03/26/2023   CBC    Component Value Date/Time   WBC 9.5 03/26/2023 1108   WBC 9.9 06/29/2022 0745   RBC 5.04 03/26/2023 1108   RBC 4.31 06/29/2022 0745   HGB 14.2 03/26/2023 1108   HCT 43.2 03/26/2023 1108   PLT 394 03/26/2023 1108   MCV 86 03/26/2023 1108   MCH 28.2 03/26/2023 1108   MCH 28.8 06/29/2022 0745   MCHC 32.9 03/26/2023 1108   MCHC 33.3 06/29/2022 0745   RDW 13.0 03/26/2023 1108  Iron Studies No results found for: "IRON", "TIBC", "FERRITIN", "IRONPCTSAT" Lipid Panel     Component Value Date/Time   CHOL 171 03/26/2023 1108   TRIG 162 (H) 03/26/2023 1108   HDL 36 (L) 03/26/2023 1108   CHOLHDL 3.0 08/15/2021 0828   CHOLHDL 3 09/22/2020 0954   VLDL 14.2 09/22/2020 0954   LDLCALC 106 (H) 03/26/2023 1108   Hepatic Function Panel     Component Value Date/Time   PROT 7.3 03/26/2023 1108   ALBUMIN 4.6 03/26/2023 1108   AST 17 03/26/2023 1108   ALT 22 03/26/2023 1108   ALKPHOS 67 03/26/2023 1108   BILITOT 0.5 03/26/2023 1108   BILIDIR 0.1 06/08/2008 0525   IBILI 0.3 06/08/2008 0525      Component Value Date/Time   TSH 2.210 03/26/2023 1108   Nutritional Lab  Results  Component Value Date   VD25OH 23.3 (L) 03/26/2023   VD25OH 41.3 08/15/2021   VD25OH 39.8 03/30/2021     Assessment and Plan    Obesity Following a category two eating plan 85% of the time, resulting in a 5-pound weight loss over the last month. Not currently exercising. On Wegovy and metformin, which have been effective in managing weight and prediabetes. Recognizes satiety cues to avoid nausea from [facility]. Discussed the importance of regular exercise and monitoring for side effects. Prefers to continue current medications and dietary plan. - Continue [phone] - Encourage regular exercise - Refill Wegovy  Prediabetes On metformin and managing diet well. Last labs in August. Discussed the importance of monitoring blood glucose levels and early intervention benefits. Prefers to have labs checked before year-end to utilize current deductible. - Refill metformin - Order fasting blood glucose and HbA1c - Check labs before year-end or at next appointment on July 23, 2023  Mixed Hyperlipidemia Previous labs showed elevated LDL and triglycerides. Working on dietary modifications with weight management improvement. Discussed the importance of continued dietary changes and lipid level monitoring. - Order lipid panel - Continue dietary modifications  Vitamin D Deficiency Previous labs indicated low vitamin D levels. On vitamin D supplementation. Discussed the importance of maintaining adequate vitamin D levels and monitoring. - Refill vitamin D - Order vitamin D level  General Health Maintenance Discussed maintaining a balanced diet during the holiday season and strategies for healthy eating while traveling. Provided specific protein options for fast food (e.g., [employer] grilled nuggets, [employer] roast beef). - Encourage healthy eating habits - Discuss better protein options for fast food  Follow-up - Schedule next two to three appointments at three-week intervals - Discuss  lab results at the next appointment on July 23, 2023.        She was informed of the importance of frequent follow up visits to maximize her success with intensive lifestyle modifications for her multiple health conditions.    Quillian Quince, MD

## 2023-07-13 ENCOUNTER — Other Ambulatory Visit (INDEPENDENT_AMBULATORY_CARE_PROVIDER_SITE_OTHER): Payer: Self-pay | Admitting: Family Medicine

## 2023-07-13 ENCOUNTER — Other Ambulatory Visit: Payer: Self-pay

## 2023-07-13 DIAGNOSIS — R7303 Prediabetes: Secondary | ICD-10-CM

## 2023-07-16 ENCOUNTER — Other Ambulatory Visit (HOSPITAL_COMMUNITY): Payer: Self-pay

## 2023-07-16 ENCOUNTER — Telehealth (INDEPENDENT_AMBULATORY_CARE_PROVIDER_SITE_OTHER): Payer: Self-pay

## 2023-07-16 NOTE — Telephone Encounter (Signed)
Prior auth started Key: [facility]

## 2023-07-17 ENCOUNTER — Other Ambulatory Visit (HOSPITAL_COMMUNITY): Payer: Self-pay

## 2023-07-17 NOTE — Telephone Encounter (Signed)
Message from insurance:  Clinical Override not needed

## 2023-07-18 ENCOUNTER — Other Ambulatory Visit (HOSPITAL_COMMUNITY): Payer: Self-pay

## 2023-07-19 ENCOUNTER — Encounter (INDEPENDENT_AMBULATORY_CARE_PROVIDER_SITE_OTHER): Payer: Self-pay

## 2023-07-19 ENCOUNTER — Other Ambulatory Visit (HOSPITAL_COMMUNITY): Payer: Self-pay

## 2023-07-19 ENCOUNTER — Telehealth (INDEPENDENT_AMBULATORY_CARE_PROVIDER_SITE_OTHER): Payer: Self-pay | Admitting: Family Medicine

## 2023-07-19 NOTE — Telephone Encounter (Signed)
Pt called stating that she needs a refill of Wegovy by this weekend. Pt states that her pharmacy required a Prior Auth. Please call pt today concerning PA for medication.

## 2023-07-23 ENCOUNTER — Ambulatory Visit (INDEPENDENT_AMBULATORY_CARE_PROVIDER_SITE_OTHER): Payer: Medicaid Other | Admitting: Family Medicine

## 2023-07-27 ENCOUNTER — Other Ambulatory Visit (HOSPITAL_COMMUNITY): Payer: Self-pay

## 2023-08-17 DIAGNOSIS — Z713 Dietary counseling and surveillance: Secondary | ICD-10-CM | POA: Diagnosis not present

## 2023-08-17 DIAGNOSIS — Z6841 Body Mass Index (BMI) 40.0 and over, adult: Secondary | ICD-10-CM | POA: Diagnosis not present

## 2023-08-28 ENCOUNTER — Ambulatory Visit (INDEPENDENT_AMBULATORY_CARE_PROVIDER_SITE_OTHER): Payer: Self-pay | Admitting: Family Medicine

## 2023-09-04 DIAGNOSIS — Z6841 Body Mass Index (BMI) 40.0 and over, adult: Secondary | ICD-10-CM | POA: Diagnosis not present

## 2023-09-04 DIAGNOSIS — Z713 Dietary counseling and surveillance: Secondary | ICD-10-CM | POA: Diagnosis not present

## 2023-09-05 ENCOUNTER — Telehealth: Payer: Self-pay | Admitting: Internal Medicine

## 2023-09-05 NOTE — Telephone Encounter (Signed)
 Left message on machine for patient that vaccine record is available to pick up.

## 2023-09-05 NOTE — Telephone Encounter (Signed)
 Copied from CRM [id number]. Topic: Medical Record Request - Records Request >> Sep 05, 2023 11:03 AM [name] wrote: Reason for CRM: Patient requesting her immunization records for work. She stated she needs them today. If there is a fee, can they be emailed? Please f/u

## 2023-09-10 DIAGNOSIS — Z01818 Encounter for other preprocedural examination: Secondary | ICD-10-CM | POA: Diagnosis not present

## 2023-09-24 DIAGNOSIS — Z79899 Other long term (current) drug therapy: Secondary | ICD-10-CM | POA: Diagnosis not present

## 2023-09-24 DIAGNOSIS — Z6841 Body Mass Index (BMI) 40.0 and over, adult: Secondary | ICD-10-CM | POA: Diagnosis not present

## 2023-09-24 DIAGNOSIS — E119 Type 2 diabetes mellitus without complications: Secondary | ICD-10-CM | POA: Diagnosis not present

## 2023-09-24 DIAGNOSIS — F418 Other specified anxiety disorders: Secondary | ICD-10-CM | POA: Diagnosis not present

## 2023-09-24 DIAGNOSIS — Z7984 Long term (current) use of oral hypoglycemic drugs: Secondary | ICD-10-CM | POA: Diagnosis not present

## 2023-09-24 DIAGNOSIS — E785 Hyperlipidemia, unspecified: Secondary | ICD-10-CM | POA: Diagnosis not present

## 2023-09-24 DIAGNOSIS — R7303 Prediabetes: Secondary | ICD-10-CM | POA: Diagnosis not present

## 2023-09-24 DIAGNOSIS — F419 Anxiety disorder, unspecified: Secondary | ICD-10-CM | POA: Diagnosis not present

## 2023-09-24 DIAGNOSIS — F32A Depression, unspecified: Secondary | ICD-10-CM | POA: Diagnosis not present

## 2023-09-27 ENCOUNTER — Ambulatory Visit (INDEPENDENT_AMBULATORY_CARE_PROVIDER_SITE_OTHER): Payer: BC Managed Care – PPO | Admitting: Family Medicine

## 2023-10-04 ENCOUNTER — Encounter (INDEPENDENT_AMBULATORY_CARE_PROVIDER_SITE_OTHER): Payer: Self-pay

## 2023-10-18 DIAGNOSIS — Z9884 Bariatric surgery status: Secondary | ICD-10-CM | POA: Diagnosis not present

## 2023-10-18 DIAGNOSIS — E669 Obesity, unspecified: Secondary | ICD-10-CM | POA: Diagnosis not present

## 2023-10-18 DIAGNOSIS — Z713 Dietary counseling and surveillance: Secondary | ICD-10-CM | POA: Diagnosis not present

## 2023-10-19 DIAGNOSIS — F902 Attention-deficit hyperactivity disorder, combined type: Secondary | ICD-10-CM | POA: Diagnosis not present

## 2023-10-19 DIAGNOSIS — F349 Persistent mood [affective] disorder, unspecified: Secondary | ICD-10-CM | POA: Diagnosis not present

## 2023-11-28 DIAGNOSIS — F409 Phobic anxiety disorder, unspecified: Secondary | ICD-10-CM | POA: Diagnosis not present

## 2023-12-07 DIAGNOSIS — F409 Phobic anxiety disorder, unspecified: Secondary | ICD-10-CM | POA: Diagnosis not present

## 2023-12-26 DIAGNOSIS — Z903 Acquired absence of stomach [part of]: Secondary | ICD-10-CM | POA: Diagnosis not present

## 2023-12-26 DIAGNOSIS — Z713 Dietary counseling and surveillance: Secondary | ICD-10-CM | POA: Diagnosis not present

## 2024-03-07 DIAGNOSIS — Z01419 Encounter for gynecological examination (general) (routine) without abnormal findings: Secondary | ICD-10-CM | POA: Diagnosis not present

## 2024-03-07 DIAGNOSIS — Z803 Family history of malignant neoplasm of breast: Secondary | ICD-10-CM | POA: Diagnosis not present

## 2024-03-27 DIAGNOSIS — Z6832 Body mass index (BMI) 32.0-32.9, adult: Secondary | ICD-10-CM | POA: Diagnosis not present

## 2024-03-27 DIAGNOSIS — Z713 Dietary counseling and surveillance: Secondary | ICD-10-CM | POA: Diagnosis not present

## 2024-03-27 DIAGNOSIS — Z903 Acquired absence of stomach [part of]: Secondary | ICD-10-CM | POA: Diagnosis not present

## 2024-03-27 DIAGNOSIS — Z7183 Encounter for nonprocreative genetic counseling: Secondary | ICD-10-CM | POA: Diagnosis not present

## 2024-03-28 DIAGNOSIS — Z713 Dietary counseling and surveillance: Secondary | ICD-10-CM | POA: Diagnosis not present

## 2024-03-28 DIAGNOSIS — Z903 Acquired absence of stomach [part of]: Secondary | ICD-10-CM | POA: Diagnosis not present

## 2024-04-18 DIAGNOSIS — Z803 Family history of malignant neoplasm of breast: Secondary | ICD-10-CM | POA: Diagnosis not present

## 2024-04-24 DIAGNOSIS — K5904 Chronic idiopathic constipation: Secondary | ICD-10-CM | POA: Diagnosis not present

## 2024-04-24 DIAGNOSIS — Z1211 Encounter for screening for malignant neoplasm of colon: Secondary | ICD-10-CM | POA: Diagnosis not present

## 2024-04-24 DIAGNOSIS — E669 Obesity, unspecified: Secondary | ICD-10-CM | POA: Diagnosis not present

## 2024-04-24 DIAGNOSIS — Z1509 Genetic susceptibility to other malignant neoplasm: Secondary | ICD-10-CM | POA: Diagnosis not present

## 2024-04-25 DIAGNOSIS — M180 Bilateral primary osteoarthritis of first carpometacarpal joints: Secondary | ICD-10-CM | POA: Diagnosis not present

## 2024-04-25 DIAGNOSIS — G5603 Carpal tunnel syndrome, bilateral upper limbs: Secondary | ICD-10-CM | POA: Diagnosis not present

## 2024-04-25 DIAGNOSIS — G5623 Lesion of ulnar nerve, bilateral upper limbs: Secondary | ICD-10-CM | POA: Diagnosis not present

## 2024-06-06 DIAGNOSIS — Z1509 Genetic susceptibility to other malignant neoplasm: Secondary | ICD-10-CM | POA: Diagnosis not present

## 2024-06-06 DIAGNOSIS — K635 Polyp of colon: Secondary | ICD-10-CM | POA: Diagnosis not present

## 2024-06-06 DIAGNOSIS — Z1211 Encounter for screening for malignant neoplasm of colon: Secondary | ICD-10-CM | POA: Diagnosis not present

## 2024-06-12 DIAGNOSIS — G5623 Lesion of ulnar nerve, bilateral upper limbs: Secondary | ICD-10-CM | POA: Diagnosis not present

## 2024-06-12 DIAGNOSIS — G5603 Carpal tunnel syndrome, bilateral upper limbs: Secondary | ICD-10-CM | POA: Diagnosis not present

## 2024-06-12 DIAGNOSIS — G5622 Lesion of ulnar nerve, left upper limb: Secondary | ICD-10-CM | POA: Diagnosis not present

## 2024-06-20 DIAGNOSIS — G5603 Carpal tunnel syndrome, bilateral upper limbs: Secondary | ICD-10-CM | POA: Diagnosis not present

## 2024-06-20 DIAGNOSIS — G5623 Lesion of ulnar nerve, bilateral upper limbs: Secondary | ICD-10-CM | POA: Diagnosis not present

## 2024-06-20 DIAGNOSIS — M1811 Unilateral primary osteoarthritis of first carpometacarpal joint, right hand: Secondary | ICD-10-CM | POA: Diagnosis not present

## 2024-07-03 DIAGNOSIS — G5601 Carpal tunnel syndrome, right upper limb: Secondary | ICD-10-CM | POA: Diagnosis not present

## 2024-07-03 DIAGNOSIS — M180 Bilateral primary osteoarthritis of first carpometacarpal joints: Secondary | ICD-10-CM | POA: Diagnosis not present

## 2024-07-03 DIAGNOSIS — M1811 Unilateral primary osteoarthritis of first carpometacarpal joint, right hand: Secondary | ICD-10-CM | POA: Diagnosis not present

## 2024-07-03 DIAGNOSIS — G5603 Carpal tunnel syndrome, bilateral upper limbs: Secondary | ICD-10-CM | POA: Diagnosis not present

## 2024-07-03 DIAGNOSIS — G5621 Lesion of ulnar nerve, right upper limb: Secondary | ICD-10-CM | POA: Diagnosis not present

## 2024-07-14 DIAGNOSIS — M25621 Stiffness of right elbow, not elsewhere classified: Secondary | ICD-10-CM | POA: Diagnosis not present

## 2024-07-23 DIAGNOSIS — R051 Acute cough: Secondary | ICD-10-CM | POA: Diagnosis not present

## 2024-07-23 DIAGNOSIS — R509 Fever, unspecified: Secondary | ICD-10-CM | POA: Diagnosis not present

## 2024-07-23 DIAGNOSIS — R0981 Nasal congestion: Secondary | ICD-10-CM | POA: Diagnosis not present

## 2024-07-30 DIAGNOSIS — M79641 Pain in right hand: Secondary | ICD-10-CM | POA: Diagnosis not present
